# Patient Record
Sex: Male | Born: 1950 | Race: White | Hispanic: No | Marital: Married | Smoking: Former smoker
Health system: Southern US, Community
[De-identification: ages and names within clinical notes are randomized; demographics above are authoritative.]

## PROBLEM LIST (undated history)

## (undated) DIAGNOSIS — M5030 Other cervical disc degeneration, unspecified cervical region: Secondary | ICD-10-CM

## (undated) DIAGNOSIS — E8881 Metabolic syndrome: Secondary | ICD-10-CM

## (undated) DIAGNOSIS — E785 Hyperlipidemia, unspecified: Secondary | ICD-10-CM

## (undated) DIAGNOSIS — Z8619 Personal history of other infectious and parasitic diseases: Secondary | ICD-10-CM

## (undated) DIAGNOSIS — G4730 Sleep apnea, unspecified: Secondary | ICD-10-CM

## (undated) DIAGNOSIS — I10 Essential (primary) hypertension: Secondary | ICD-10-CM

## (undated) DIAGNOSIS — Z86010 Personal history of colonic polyps: Secondary | ICD-10-CM

## (undated) DIAGNOSIS — E119 Type 2 diabetes mellitus without complications: Secondary | ICD-10-CM

## (undated) DIAGNOSIS — Z85828 Personal history of other malignant neoplasm of skin: Secondary | ICD-10-CM

## (undated) DIAGNOSIS — L719 Rosacea, unspecified: Secondary | ICD-10-CM

## (undated) HISTORY — DX: Personal history of other malignant neoplasm of skin: Z85.828

## (undated) HISTORY — DX: Metabolic syndrome: E88.810

## (undated) HISTORY — DX: Metabolic syndrome: E88.81

## (undated) HISTORY — DX: Personal history of other infectious and parasitic diseases: Z86.19

## (undated) HISTORY — DX: Type 2 diabetes mellitus without complications: E11.9

## (undated) HISTORY — DX: Essential (primary) hypertension: I10

## (undated) HISTORY — DX: Hyperlipidemia, unspecified: E78.5

## (undated) HISTORY — DX: Personal history of colonic polyps: Z86.010

## (undated) HISTORY — DX: Rosacea, unspecified: L71.9

## (undated) HISTORY — PX: NASAL SINUS SURGERY: SHX719

## (undated) HISTORY — PX: CERVICAL FUSION: SHX112

## (undated) HISTORY — DX: Other cervical disc degeneration, unspecified cervical region: M50.30

---

## 1997-12-23 ENCOUNTER — Ambulatory Visit: Admission: RE | Admit: 1997-12-23 | Discharge: 1997-12-23 | Payer: Self-pay | Admitting: Otolaryngology

## 2003-04-24 HISTORY — PX: NASAL SINUS SURGERY: SHX719

## 2004-03-31 ENCOUNTER — Ambulatory Visit: Payer: Self-pay | Admitting: Internal Medicine

## 2004-04-23 HISTORY — PX: COLONOSCOPY: SHX174

## 2004-10-30 ENCOUNTER — Ambulatory Visit: Payer: Self-pay | Admitting: Internal Medicine

## 2004-11-14 ENCOUNTER — Ambulatory Visit: Payer: Self-pay | Admitting: Internal Medicine

## 2004-11-22 ENCOUNTER — Ambulatory Visit: Payer: Self-pay | Admitting: Internal Medicine

## 2004-12-06 ENCOUNTER — Ambulatory Visit: Payer: Self-pay | Admitting: Internal Medicine

## 2005-02-23 ENCOUNTER — Ambulatory Visit: Payer: Self-pay | Admitting: Internal Medicine

## 2005-04-03 ENCOUNTER — Ambulatory Visit: Payer: Self-pay | Admitting: Internal Medicine

## 2005-07-16 ENCOUNTER — Ambulatory Visit: Payer: Self-pay | Admitting: Internal Medicine

## 2006-04-11 ENCOUNTER — Ambulatory Visit: Payer: Self-pay | Admitting: Internal Medicine

## 2006-04-11 LAB — CONVERTED CEMR LAB
ALT: 38 units/L (ref 0–40)
Chol/HDL Ratio, serum: 6.7
Creatinine, Ser: 1.3 mg/dL (ref 0.4–1.5)
Hgb A1c MFr Bld: 6 % (ref 4.6–6.0)
LDL DIRECT: 159.4 mg/dL
Triglyceride fasting, serum: 175 mg/dL — ABNORMAL HIGH (ref 0–149)
VLDL: 35 mg/dL (ref 0–40)

## 2006-06-17 ENCOUNTER — Ambulatory Visit: Payer: Self-pay | Admitting: Internal Medicine

## 2007-01-30 ENCOUNTER — Ambulatory Visit: Payer: Self-pay | Admitting: Internal Medicine

## 2007-02-02 LAB — CONVERTED CEMR LAB
AST: 47 units/L — ABNORMAL HIGH (ref 0–37)
Cholesterol: 200 mg/dL (ref 0–200)
HDL: 29.9 mg/dL — ABNORMAL LOW (ref >39.0)
LDL Cholesterol: 134 mg/dL — ABNORMAL HIGH (ref 0–99)
Total CHOL/HDL Ratio: 6.7
Triglycerides: 179 mg/dL — ABNORMAL HIGH (ref 0–149)

## 2007-02-03 ENCOUNTER — Encounter (INDEPENDENT_AMBULATORY_CARE_PROVIDER_SITE_OTHER): Payer: Self-pay | Admitting: *Deleted

## 2007-02-04 ENCOUNTER — Ambulatory Visit: Payer: Self-pay | Admitting: Internal Medicine

## 2007-02-04 DIAGNOSIS — I10 Essential (primary) hypertension: Secondary | ICD-10-CM

## 2007-02-04 DIAGNOSIS — E782 Mixed hyperlipidemia: Secondary | ICD-10-CM | POA: Insufficient documentation

## 2007-02-04 LAB — CONVERTED CEMR LAB
Cholesterol, target level: 200 mg/dL
HDL goal, serum: 40 mg/dL
Hgb A1c MFr Bld: 6.1 % — ABNORMAL HIGH (ref 4.6–6.0)
LDL Goal: 130 mg/dL

## 2007-02-05 ENCOUNTER — Encounter (INDEPENDENT_AMBULATORY_CARE_PROVIDER_SITE_OTHER): Payer: Self-pay | Admitting: *Deleted

## 2007-07-22 ENCOUNTER — Encounter: Payer: Self-pay | Admitting: Internal Medicine

## 2007-07-22 ENCOUNTER — Telehealth (INDEPENDENT_AMBULATORY_CARE_PROVIDER_SITE_OTHER): Payer: Self-pay | Admitting: *Deleted

## 2007-08-06 ENCOUNTER — Ambulatory Visit: Payer: Self-pay | Admitting: Internal Medicine

## 2007-08-13 ENCOUNTER — Encounter (INDEPENDENT_AMBULATORY_CARE_PROVIDER_SITE_OTHER): Payer: Self-pay | Admitting: *Deleted

## 2007-08-13 LAB — CONVERTED CEMR LAB
Cholesterol: 260 mg/dL (ref 0–200)
Direct LDL: 154.7 mg/dL

## 2007-11-06 ENCOUNTER — Ambulatory Visit: Payer: Self-pay | Admitting: Internal Medicine

## 2008-09-07 ENCOUNTER — Encounter (INDEPENDENT_AMBULATORY_CARE_PROVIDER_SITE_OTHER): Payer: Self-pay | Admitting: *Deleted

## 2008-09-13 ENCOUNTER — Ambulatory Visit: Payer: Self-pay | Admitting: Internal Medicine

## 2008-09-13 LAB — CONVERTED CEMR LAB
Albumin: 3.7 g/dL (ref 3.5–5.2)
Alkaline Phosphatase: 38 units/L — ABNORMAL LOW (ref 39–117)
Cholesterol: 270 mg/dL — ABNORMAL HIGH (ref 0–200)
Hgb A1c MFr Bld: 5.9 % (ref 4.6–6.5)
Total CHOL/HDL Ratio: 9
Total Protein: 7.5 g/dL (ref 6.0–8.3)
Triglycerides: 492 mg/dL — ABNORMAL HIGH (ref 0.0–149.0)
VLDL: 98.4 mg/dL — ABNORMAL HIGH (ref 0.0–40.0)

## 2008-09-17 ENCOUNTER — Ambulatory Visit: Payer: Self-pay | Admitting: Internal Medicine

## 2008-09-17 DIAGNOSIS — E119 Type 2 diabetes mellitus without complications: Secondary | ICD-10-CM

## 2008-11-15 ENCOUNTER — Ambulatory Visit: Payer: Self-pay | Admitting: Internal Medicine

## 2008-11-15 DIAGNOSIS — M5412 Radiculopathy, cervical region: Secondary | ICD-10-CM | POA: Insufficient documentation

## 2008-11-18 ENCOUNTER — Encounter: Admission: RE | Admit: 2008-11-18 | Discharge: 2008-11-18 | Payer: Self-pay | Admitting: Family Medicine

## 2008-11-19 ENCOUNTER — Encounter (INDEPENDENT_AMBULATORY_CARE_PROVIDER_SITE_OTHER): Payer: Self-pay | Admitting: *Deleted

## 2008-11-24 ENCOUNTER — Telehealth (INDEPENDENT_AMBULATORY_CARE_PROVIDER_SITE_OTHER): Payer: Self-pay | Admitting: *Deleted

## 2008-12-10 ENCOUNTER — Telehealth (INDEPENDENT_AMBULATORY_CARE_PROVIDER_SITE_OTHER): Payer: Self-pay | Admitting: *Deleted

## 2008-12-10 ENCOUNTER — Encounter: Payer: Self-pay | Admitting: Internal Medicine

## 2008-12-20 ENCOUNTER — Encounter: Payer: Self-pay | Admitting: Internal Medicine

## 2009-01-07 ENCOUNTER — Ambulatory Visit (HOSPITAL_COMMUNITY): Admission: RE | Admit: 2009-01-07 | Discharge: 2009-01-07 | Payer: Self-pay | Admitting: Neurosurgery

## 2009-02-02 ENCOUNTER — Ambulatory Visit: Payer: Self-pay | Admitting: Internal Medicine

## 2009-10-27 ENCOUNTER — Ambulatory Visit: Payer: Self-pay | Admitting: Internal Medicine

## 2009-10-27 DIAGNOSIS — Z85828 Personal history of other malignant neoplasm of skin: Secondary | ICD-10-CM

## 2010-01-18 ENCOUNTER — Ambulatory Visit: Payer: Self-pay | Admitting: Internal Medicine

## 2010-05-25 NOTE — Assessment & Plan Note (Signed)
Summary: med refill/cbs   Vital Signs:  Patient profile:   60 year old male Height:      67.75 inches Weight:      235.0 pounds BMI:     36.13 Temp:     98.7 degrees F oral Pulse rate:   76 / minute Resp:     14 per minute BP sitting:   126 / 74  (left arm) Cuff size:   large  Vitals Entered By: Shonna Chock (October 27, 2009 1:46 PM)  CC: Yearly follow-up, refill Benazepril and Trilipix, Lipid Management, General Medical Evaluation Comments REVIEWED MED LIST, PATIENT AGREED DOSE AND INSTRUCTION CORRECT    CC:  Yearly follow-up, refill Benazepril and Trilipix, Lipid Management, and General Medical Evaluation.  History of Present Illness: [name] is here for med refills; he had cervical N-S in 12/2008 by Dr Gerlene Fee.  Hyperlipidemia Follow-Up      This is a 60 year old man who presents for Hyperlipidemia follow-up.  The patient denies muscle aches, GI upset, abdominal pain, flushing, itching, constipation, diarrhea, and fatigue.  The patient denies the following symptoms: chest pain/pressure, exercise intolerance, dypsnea, palpitations, syncope, and pedal edema.  Compliance with medications (by patient report) has been near 100%.  Dietary compliance has been fair.  The patient reports exercising 3-4X per week.  Adjunctive measures currently used by the patient include ASA and folic acid.    Hypertension Follow-Up      The patient also presents for Hypertension follow-up.  BP @ home not monitored.  The patient denies lightheadedness, urinary frequency, headaches, impotence, and rash.  Compliance with medications (by patient report) has been near 100%.  Adjunctive measures currently used by the patient include salt restriction.    Lipid Management History:      Positive NCEP/ATP III risk factors include male age 30 years old or older, HDL cholesterol less than 40, and hypertension.  Negative NCEP/ATP III risk factors include non-diabetic, no family history for ischemic heart disease,  non-tobacco-user status, no ASHD (atherosclerotic heart disease), no prior stroke/TIA, no peripheral vascular disease, and no history of aortic aneurysm.     Preventive Screening-Counseling & Management  Alcohol-Tobacco     Smoking Status: quit     Year Quit: 1972  Allergies (verified): No Known Drug Allergies  Past History:  Past Medical History: Hyperlipidemia: [place] Study LDL goal = < 130. Hypertension Rosacea Metabolic Syndrome; Cervical DDD Skin cancer, hx of, Dr Donzetta Starch  Past Surgical History: Sinus surgery Colonoscopy 2006 negative  Cervical fusion @ 2 levels 12/2008, Dr Gerlene Fee  Family History: Father: CABG in 48s Mother:Alsheimer's  Disease Siblings: none  Social History: Retired Former Smoker Alcohol use-no  Review of Systems  The patient denies anorexia, fever, weight loss, weight gain, vision loss, decreased hearing, hoarseness, prolonged cough, hemoptysis, abdominal pain, melena, hematochezia, severe indigestion/heartburn, hematuria, incontinence, depression, unusual weight change, abnormal bleeding, enlarged lymph nodes, and angioedema.   MS:  Denies joint pain, joint redness, joint swelling, low back pain, mid back pain, and thoracic pain. Neuro:  Denies disturbances in coordination, numbness, poor balance, tingling, and weakness.  Physical Exam  General:  well-nourished; alert,appropriate and cooperative throughout examination Head:  Normocephalic and atraumatic without obvious abnormalities. Pattern  alopecia  Eyes:  No corneal or conjunctival inflammation noted. Perrla. Funduscopic exam benign, without hemorrhages, exudates or papilledema.  Ears:  External ear exam shows no significant lesions or deformities.  Otoscopic examination reveals clear canals, tympanic membranes are intact bilaterally without bulging,  retraction, inflammation or discharge. Hearing is grossly normal bilaterally. Nose:  External nasal examination shows no deformity or  inflammation. Nasal mucosa are pink and moist without lesions or exudates. Mouth:  Oral mucosa and oropharynx without lesions or exudates.  Teeth in good repair. Neck:  No deformities, masses, or tenderness noted. Lungs:  Normal respiratory effort, chest expands symmetrically. Lungs are clear to auscultation, no crackles or wheezes. Heart:  normal rate, regular rhythm, no gallop, no rub, no JVD, no HJR, and grade 1 /6 systolic murmur.   Abdomen:  Bowel sounds positive,abdomen soft and non-tender without masses, organomegaly or hernias noted. Rectal:  No external abnormalities noted. Normal sphincter tone. No rectal masses or tenderness. Genitalia:  Testes bilaterally descended without nodularity, tenderness or masses. No scrotal masses or lesions. No penis lesions or urethral discharge. Prostate:  Prostate gland firm and smooth, no enlargement, nodularity, tenderness, mass, asymmetry or induration. Msk:  No deformity or scoliosis noted of thoracic or lumbar spine.   Pulses:  R and L carotid,radial,dorsalis pedis and posterior tibial pulses are full and equal bilaterally Extremities:  No clubbing, cyanosis, edema, or deformity noted with normal full range of motion of all joints.   Neurologic:  alert & oriented X3, strength normal in all extremities, and DTRs symmetrical and normal.   Skin:  Solar changes Cervical Nodes:  No lymphadenopathy noted Axillary Nodes:  No palpable lymphadenopathy Inguinal Nodes:  No significant adenopathy Psych:  memory intact for recent and remote, normally interactive, and good eye contact.     Impression & Recommendations:  Problem # 1:  ROUTINE GENERAL MEDICAL EXAM@HEALTH  CARE FACL (ICD-V70.0)  Orders: EKG w/ Interpretation (93000)  Problem # 2:  HYPERTENSION, ESSENTIAL NOS (ICD-401.9)  Controlled His updated medication list for this problem includes:    Benazepril Hcl 20 Mg Tabs (Benazepril hcl) .Marland Kitchen... 1 by mouth once daily.  Orders: EKG w/  Interpretation (93000)  Problem # 3:  HYPERLIPIDEMIA (ICD-272.2)  His updated medication list for this problem includes:    Trilipix 135 Mg Cpdr (Choline fenofibrate)   Problem # 4:  METABOLIC SYNDROME X (ICD-277.7)  Problem # 5:  SKIN CANCER, HX OF (ICD-V10.83) as per Dr Donzetta Starch  Complete Medication List: 1)  Benazepril Hcl 20 Mg Tabs (Benazepril hcl) .Marland Kitchen.. 1 by mouth once daily. 2)  Adult Aspirin Low Strength 81 Mg Tbdp (Aspirin) .Marland Kitchen.. 1 by mouth once daily 3)  Folic Acid 1 Mg Tabs (Folic acid) .Marland Kitchen.. 1 by mouth once daily 4)  Metoprolol Tartrate 50 Mg  .Marland KitchenMarland Kitchen. 1 by mouth two times a day 5)  Trilipix 135 Mg Cpdr (Choline fenofibrate) .Marland Kitchen.. 1 once daily  Lipid Assessment/Plan:      Based on NCEP/ATP III, the patient's risk factor category is "2 or more risk factors and a calculated 10 year CAD risk of > 20%".  The patient's lipid goals are as follows: Total cholesterol goal is 200; LDL cholesterol goal is 130; HDL cholesterol goal is 40; Triglyceride goal is 150.  His LDL cholesterol goal has not been met.  Secondary causes for hyperlipidemia have been ruled out.  He has been counseled on adjunctive measures for lowering his cholesterol and has been provided with dietary instructions.    Patient Instructions: 1)  Consume < 40 grams (< 10 tsp) of "sugar" / day from High Fructose Corn Syrup as #1,2 or #3 on label.Schedule fasting labs; see Diagnoses for Codes: 2)  BMP ; 3)  Hepatic Panel ; 4)  Lipid Panel ;  5)  TSH; 6)  CBC w/ Diff; 7)  PSA. Prescriptions: TRILIPIX 135 MG  CPDR (CHOLINE FENOFIBRATE) 1 once daily  #90 x 3   Entered and Authorized by:   Marga Melnick MD   Signed by:   Marga Melnick MD on 10/27/2009   Method used:   Print then Give to Patient   RxID:   [id number] METOPROLOL TARTRATE 50 MG 1 by mouth two times a day  #180 x 3   Entered and Authorized by:   Marga Melnick MD   Signed by:   Marga Melnick MD on 10/27/2009   Method used:   Print then Give to  Patient   RxID:   [id number] FOLIC ACID 1 MG  TABS (FOLIC ACID) 1 by mouth once daily  #90 x 3   Entered and Authorized by:   Marga Melnick MD   Signed by:   Marga Melnick MD on 10/27/2009   Method used:   Print then Give to Patient   RxID:   [id number] BENAZEPRIL HCL 20 MG TABS (BENAZEPRIL HCL) 1 by mouth once daily.  #90 x 3   Entered and Authorized by:   Marga Melnick MD   Signed by:   Marga Melnick MD on 10/27/2009   Method used:   Print then Give to Patient   RxID:   [id number]

## 2010-05-25 NOTE — Assessment & Plan Note (Signed)
Summary: FLU SHOT//PH  Nurse Visit   Allergies: No Known Drug Allergies  Orders Added: 1)  Admin 1st Vaccine [90471] 2)  Flu Vaccine 61yrs + [29562] Flu Vaccine Consent Questions     Do you have a history of severe allergic reactions to this vaccine? no    Any prior history of allergic reactions to egg and/or gelatin? no    Do you have a sensitivity to the preservative Thimersol? no    Do you have a past history of Guillan-Barre Syndrome? no    Do you currently have an acute febrile illness? no    Have you ever had a severe reaction to latex? no    Vaccine information given and explained to patient? yes    Are you currently pregnant? no    Lot Number:[id number]   Exp Date:10/21/2010   Site Given  Left Deltoid IM .lbflu

## 2010-06-12 HISTORY — PX: CERVICAL FUSION: SHX112

## 2010-07-28 LAB — BASIC METABOLIC PANEL
CO2: 26 mEq/L (ref 19–32)
Calcium: 9.7 mg/dL (ref 8.4–10.5)
Creatinine, Ser: 1.11 mg/dL (ref 0.4–1.5)
GFR calc Af Amer: >60 mL/min (ref >60)
GFR calc non Af Amer: >60 mL/min (ref >60)
Glucose, Bld: 109 mg/dL — ABNORMAL HIGH (ref 70–99)
Sodium: 138 mEq/L (ref 135–145)

## 2010-07-28 LAB — CBC
Hemoglobin: 15.7 g/dL (ref 13.0–17.0)
MCHC: 34.5 g/dL (ref 30.0–36.0)
RBC: 4.8 MIL/uL (ref 4.22–5.81)
RDW: 13.5 % (ref 11.5–15.5)

## 2010-09-14 ENCOUNTER — Ambulatory Visit (INDEPENDENT_AMBULATORY_CARE_PROVIDER_SITE_OTHER): Payer: Federal, State, Local not specified - PPO | Admitting: Family Medicine

## 2010-09-14 ENCOUNTER — Encounter: Payer: Self-pay | Admitting: Family Medicine

## 2010-09-14 VITALS — BP 120/82 | Temp 99.1°F | Wt 232.0 lb

## 2010-09-14 DIAGNOSIS — J029 Acute pharyngitis, unspecified: Secondary | ICD-10-CM

## 2010-09-14 LAB — POCT RAPID STREP A (OFFICE): Rapid Strep A Screen: NEGATIVE

## 2010-09-14 MED ORDER — AMOXICILLIN 875 MG PO TABS: 875.0000 mg | ORAL_TABLET | Freq: Two times a day (BID) | ORAL | Status: AC

## 2010-09-14 NOTE — Patient Instructions (Signed)
Common Cold, Adult An upper respiratory tract infection, or cold, is a viral infection of the air passages to the lung. Colds are contagious, especially during the first 3 or 4 days. Antibiotics cannot cure a cold. Cold germs are spread by coughs, sneezes, and hand to hand contact. A respiratory tract infection usually clears up in a few days, but some people may be sick for a week or two. HOME CARE INSTRUCTIONS  Only take over-the-counter or prescription medicines for pain, discomfort, or fever as directed by your caregiver.   Be careful not to blow your nose too hard. This may cause a nosebleed.   Use a cool-mist humidifier (vaporizer) to increase air moisture. This will make it easier for you to breath. Do not use hot steam.   Rest as much as possible and get plenty of sleep.   Wash your hands often, especially after you blow your nose. Cover your mouth and nose with a tissue when you sneeze or cough.   Drink at least 8 glasses of clear liquids every day, such as water, fruit juices, tea, clear soups, and carbonated beverages.  SEEK MEDICAL CARE IF:  An oral temperature above 100.4 lasts 4 days or more, and is not controlled by medication.   You have a sore throat that gets worse or you see white or yellow spots in your throat.   Your cough gets worse or lasts more than 10 days.   You have a rash somewhere on your skin. You have large and tender lumps in your neck.   You have an earache or a headache.   You have thick, greenish or yellowish discharge from your nose.   You cough-up thick yellow, green, gray or bloody mucus (secretions).  SEEK IMMEDIATE MEDICAL CARE IF: You have trouble breathing, chest pain, or your skin or nails look gray or blue. MAKE SURE YOU:   Understand these instructions.   Will watch your condition.   Will get help right away if you are not doing well or get worse.  Document Released: 04/06/2000 Document Re-Released: 03/22/2008 [employer] Patient  Information 2011 [employer]. 

## 2010-09-14 NOTE — Progress Notes (Signed)
Addended by: Doristine Devoid on: 09/14/2010 03:51 PM   Modules accepted: Orders

## 2010-09-14 NOTE — Progress Notes (Signed)
  Subjective:     [name] is a 60 y.o. male who presents for evaluation of symptoms of a URI. Symptoms include left ear pressure/pain, congestion, coryza, post nasal drip, sneezing and sore throat. Onset of symptoms was 3 days ago, and has been gradually worsening since that time. Treatment to date: none.  The following portions of the patient's history were reviewed and updated as appropriate: allergies, current medications, past family history, past medical history, past social history, past surgical history and problem list.  Review of Systems Pertinent items are noted in HPI.   Objective:    BP 120/82  Temp(Src) 99.1 F (37.3 C) (Oral)  Wt 232 lb (105.235 kg) General appearance: alert, cooperative, appears stated age and no distress Head: Normocephalic, without obvious abnormality, atraumatic Ears: normal TM's and external ear canals both ears Nose: Nares normal. Septum midline. Mucosa normal. No drainage or sinus tenderness. Throat: abnormal findings: marked oropharyngeal erythema and pnd Neck: moderate anterior cervical adenopathy and thyroid not enlarged, symmetric, no tenderness/mass/nodules Lungs: clear to auscultation bilaterally Heart: regular rate and rhythm, S1, S2 normal, no murmur, click, rub or gallop Lymph nodes: Cervical adenopathy: Left   Assessment:    viral pharyngitis   Plan:  veramyst and astepro Antihistamine prn Tylenol for pain and fever Amoxicillin if ST worsens Call or rto if no better in 10-14 days   Discussed diagnosis and treatment of URI. Discussed the importance of avoiding unnecessary antibiotic therapy. Suggested symptomatic OTC remedies. Nasal saline spray for congestion. Nasal steroids per orders. Follow up as needed. f/u prn

## 2010-10-05 ENCOUNTER — Telehealth: Payer: Self-pay | Admitting: Internal Medicine

## 2010-10-05 MED ORDER — METOPROLOL TARTRATE 50 MG PO TABS: 50.0000 mg | ORAL_TABLET | Freq: Two times a day (BID) | ORAL | Status: DC

## 2010-10-05 NOTE — Telephone Encounter (Signed)
Last CPX 10/2009, patient needs to schedule CPX for this year.

## 2010-10-09 NOTE — Telephone Encounter (Signed)
LMOM to call and sched CPX to see Dr Milas Gain him it would be in Sept

## 2010-10-13 ENCOUNTER — Encounter: Payer: Self-pay | Admitting: Internal Medicine

## 2010-10-16 ENCOUNTER — Ambulatory Visit (INDEPENDENT_AMBULATORY_CARE_PROVIDER_SITE_OTHER): Payer: Federal, State, Local not specified - PPO | Admitting: Internal Medicine

## 2010-10-16 ENCOUNTER — Encounter: Payer: Self-pay | Admitting: Internal Medicine

## 2010-10-16 DIAGNOSIS — R7309 Other abnormal glucose: Secondary | ICD-10-CM

## 2010-10-16 DIAGNOSIS — I10 Essential (primary) hypertension: Secondary | ICD-10-CM

## 2010-10-16 MED ORDER — METOPROLOL TARTRATE 50 MG PO TABS: 50.0000 mg | ORAL_TABLET | Freq: Two times a day (BID) | ORAL | Status: DC

## 2010-10-16 NOTE — Progress Notes (Signed)
  Subjective:    Patient ID: [name], male    DOB: 08-04-1950, 60 y.o.   MRN: [id number]  HPI  WUJ:[id number] Monitoring  Blood pressure range: not monitored  Chest pain: no   Dyspnea: no   Claudication: no Medication compliance: yes,   Medication Side Effects  Lightheadedness: no   Urinary frequency: no   Edema: no   Impotence: no  Preventitive Healthcare:  Exercise: yes, golf & walking @ work (6-7 hrs 2X/ week)   Diet Pattern: no  Salt Restriction: yes     Review of Systems     Objective:   Physical Exam Gen.: Healthy and well-nourished in appearance. Alert, appropriate and cooperative throughout exam. Neck: No deformities, masses, or tenderness noted. Thyroid  normal. Lungs: Normal respiratory effort; chest expands symmetrically. Lungs are clear to auscultation without rales, wheezes, or increased work of breathing. Heart: Normal rate and rhythm. Normal S1 and S2. No gallop, click, or rub. Grade 1/6 systolic murmur murmur. Abdomen: Bowel sounds normal; abdomen soft and nontender. No masses, organomegaly or hernias noted.  No clubbing, cyanosis, edema, or deformity noted. Nail health  good. Vascular: Carotid, radial artery, dorsalis pedis and dorsalis posterior tibial pulses are full and equal. No bruits present. Neurologic: Alert and oriented x3. Deep tendon reflexes symmetrical and normal.          Skin: Intact without suspicious lesions or rashes. Lymph: No cervical, axillary, or inguinal lymphadenopathy present. Psych: Mood and affect are normal. Normally interactive                                                                                         Assessment & Plan:  #1 HTN Plan : see Orders

## 2010-10-16 NOTE — Patient Instructions (Signed)
Preventive Health Care: Exercise at least 30-45 minutes a day,  3-4 days a week.  Eat a low-fat diet with lots of fruits and vegetables, up to 7-9 servings per day. Avoid obesity; your goal is waist measurement < 40 inches.Consume less than 40 grams of sugar per day from foods & drinks with High Fructose Corn Sugar as #2,3 or # 4 on label. Please  schedule fasting Labs : BMET,Lipids, hepatic panel,  TSH( 401.9, 790.29,272.4,995.20)

## 2010-10-18 ENCOUNTER — Other Ambulatory Visit: Payer: Self-pay | Admitting: Internal Medicine

## 2010-10-18 DIAGNOSIS — T887XXA Unspecified adverse effect of drug or medicament, initial encounter: Secondary | ICD-10-CM

## 2010-10-18 DIAGNOSIS — I10 Essential (primary) hypertension: Secondary | ICD-10-CM

## 2010-10-18 DIAGNOSIS — E785 Hyperlipidemia, unspecified: Secondary | ICD-10-CM

## 2010-10-18 DIAGNOSIS — R7309 Other abnormal glucose: Secondary | ICD-10-CM

## 2010-10-19 ENCOUNTER — Other Ambulatory Visit (INDEPENDENT_AMBULATORY_CARE_PROVIDER_SITE_OTHER): Payer: Federal, State, Local not specified - PPO

## 2010-10-19 DIAGNOSIS — E785 Hyperlipidemia, unspecified: Secondary | ICD-10-CM

## 2010-10-19 DIAGNOSIS — T887XXA Unspecified adverse effect of drug or medicament, initial encounter: Secondary | ICD-10-CM

## 2010-10-19 DIAGNOSIS — I10 Essential (primary) hypertension: Secondary | ICD-10-CM

## 2010-10-19 DIAGNOSIS — R7309 Other abnormal glucose: Secondary | ICD-10-CM

## 2010-10-19 LAB — HEPATIC FUNCTION PANEL
ALT: 50 U/L (ref 0–53)
Albumin: 4 g/dL (ref 3.5–5.2)
Total Protein: 7.5 g/dL (ref 6.0–8.3)

## 2010-10-19 LAB — CBC WITH DIFFERENTIAL/PLATELET
Basophils Relative: 0.9 % (ref 0.0–3.0)
Eosinophils Relative: 3.6 % (ref 0.0–5.0)
Lymphocytes Relative: 40.1 % (ref 12.0–46.0)
MCV: 95.5 fl (ref 78.0–100.0)
Monocytes Absolute: 0.7 10*3/uL (ref 0.1–1.0)
Monocytes Relative: 10.3 % (ref 3.0–12.0)
Neutrophils Relative %: 45.1 % (ref 43.0–77.0)
Platelets: 180 10*3/uL (ref 150.0–400.0)
RBC: 4.94 Mil/uL (ref 4.22–5.81)
WBC: 6.7 10*3/uL (ref 4.5–10.5)

## 2010-10-19 LAB — LIPID PANEL
Cholesterol: 261 mg/dL — ABNORMAL HIGH (ref 0–200)
HDL: 38 mg/dL — ABNORMAL LOW (ref >39.00)
Triglycerides: 269 mg/dL — ABNORMAL HIGH (ref 0.0–149.0)
VLDL: 53.8 mg/dL — ABNORMAL HIGH (ref 0.0–40.0)

## 2010-10-19 LAB — BASIC METABOLIC PANEL
Calcium: 9.3 mg/dL (ref 8.4–10.5)
GFR: 75.36 mL/min (ref >60.00)
Glucose, Bld: 160 mg/dL — ABNORMAL HIGH (ref 70–99)
Potassium: 4.3 mEq/L (ref 3.5–5.1)
Sodium: 136 mEq/L (ref 135–145)

## 2010-10-19 LAB — LDL CHOLESTEROL, DIRECT: Direct LDL: 158.7 mg/dL

## 2010-10-19 LAB — TSH: TSH: 1.06 u[IU]/mL (ref 0.35–5.50)

## 2010-10-19 NOTE — Progress Notes (Signed)
Labs only

## 2010-11-02 ENCOUNTER — Encounter: Payer: Self-pay | Admitting: Internal Medicine

## 2010-11-02 NOTE — Telephone Encounter (Signed)
Mailed letter to set up appt for a CPX

## 2010-11-06 ENCOUNTER — Other Ambulatory Visit: Payer: Self-pay | Admitting: Internal Medicine

## 2011-01-22 ENCOUNTER — Other Ambulatory Visit: Payer: Self-pay | Admitting: Internal Medicine

## 2011-03-27 ENCOUNTER — Other Ambulatory Visit: Payer: Self-pay | Admitting: Internal Medicine

## 2011-03-27 MED ORDER — FOLIC ACID 1 MG PO TABS: 1.0000 mg | ORAL_TABLET | Freq: Every day | ORAL | Status: DC

## 2011-03-27 NOTE — Telephone Encounter (Signed)
Rx sent 

## 2011-04-10 ENCOUNTER — Other Ambulatory Visit: Payer: Self-pay | Admitting: Orthopedic Surgery

## 2011-04-10 DIAGNOSIS — M79606 Pain in leg, unspecified: Secondary | ICD-10-CM

## 2011-04-10 DIAGNOSIS — M549 Dorsalgia, unspecified: Secondary | ICD-10-CM

## 2011-04-12 ENCOUNTER — Ambulatory Visit
Admission: RE | Admit: 2011-04-12 | Discharge: 2011-04-12 | Disposition: A | Discharge status: Home / Self Care | Payer: Federal, State, Local not specified - PPO | Source: Ambulatory Visit | Attending: Orthopedic Surgery | Admitting: Orthopedic Surgery

## 2011-04-12 DIAGNOSIS — M549 Dorsalgia, unspecified: Secondary | ICD-10-CM

## 2011-04-12 DIAGNOSIS — M79606 Pain in leg, unspecified: Secondary | ICD-10-CM

## 2011-04-12 MED ORDER — IOHEXOL 180 MG/ML  SOLN
20.0000 mL | Freq: Once | INTRAMUSCULAR | Status: AC | PRN
  Administered 2011-04-12: 20 mL via INTRATHECAL

## 2011-04-12 MED ORDER — DIAZEPAM 5 MG PO TABS
10.0000 mg | ORAL_TABLET | Freq: Once | ORAL | Status: AC
  Administered 2011-04-12: 10 mg via ORAL

## 2011-04-12 NOTE — Patient Instructions (Signed)
Myelogram   Discharge Instructions  1. Go home and rest quietly for the next 24 hours.  It is important to lie flat for the next 24 hours.  Get up only to go to the restroom.  You may lie in the bed or on a couch on your back, your stomach, your left side or your right side.  You may have one pillow under your head.  You may have pillows between your knees while you are on your side or under your knees while you are on your back.  2. DO NOT drive today.  Recline the seat as far back as it will go, while still wearing your seat belt, on the way home.  3. You may get up to go to the bathroom as needed.  You may sit up for 10 minutes to eat.  You may resume your normal diet and medications unless otherwise indicated.  4. The incidence of headache, nausea, or vomiting is about 5% (one in 20 patients).  If you develop a headache, lie flat and drink plenty of fluids until the headache goes away.  Caffeinated beverages may be helpful.  If you develop severe nausea and vomiting or a headache that does not go away with flat bed rest, call [phone].  5. You may resume normal activities after your 24 hours of bed rest is over; however, do not exert yourself strongly or do any heavy lifting tomorrow.  6. Call your physician for a follow-up appointment.  The results of your myelogram will be sent directly to your physician by the following day.  7. If you have any questions or if complications develop after you arrive home, please call [phone].  Discharge instructions have been explained to the patient.  The patient, or the person responsible for the patient, fully understands these instructions.  

## 2011-04-27 ENCOUNTER — Encounter (HOSPITAL_COMMUNITY): Payer: Self-pay

## 2011-05-01 ENCOUNTER — Other Ambulatory Visit: Payer: Self-pay | Admitting: Orthopedic Surgery

## 2011-05-02 ENCOUNTER — Encounter (HOSPITAL_COMMUNITY): Payer: Self-pay

## 2011-05-02 ENCOUNTER — Other Ambulatory Visit: Payer: Self-pay

## 2011-05-02 ENCOUNTER — Encounter (HOSPITAL_COMMUNITY)
Admission: RE | Admit: 2011-05-02 | Discharge: 2011-05-02 | Disposition: A | Discharge status: Home / Self Care | Payer: Federal, State, Local not specified - PPO | Source: Ambulatory Visit | Attending: Orthopedic Surgery | Admitting: Orthopedic Surgery

## 2011-05-02 ENCOUNTER — Ambulatory Visit (HOSPITAL_COMMUNITY)
Admission: RE | Admit: 2011-05-02 | Discharge: 2011-05-02 | Disposition: A | Discharge status: Home / Self Care | Payer: Federal, State, Local not specified - PPO | Source: Ambulatory Visit | Attending: Orthopedic Surgery | Admitting: Orthopedic Surgery

## 2011-05-02 DIAGNOSIS — Z01812 Encounter for preprocedural laboratory examination: Secondary | ICD-10-CM | POA: Insufficient documentation

## 2011-05-02 DIAGNOSIS — Z01810 Encounter for preprocedural cardiovascular examination: Secondary | ICD-10-CM | POA: Insufficient documentation

## 2011-05-02 DIAGNOSIS — I4510 Unspecified right bundle-branch block: Secondary | ICD-10-CM | POA: Insufficient documentation

## 2011-05-02 DIAGNOSIS — I10 Essential (primary) hypertension: Secondary | ICD-10-CM | POA: Insufficient documentation

## 2011-05-02 HISTORY — DX: Sleep apnea, unspecified: G47.30

## 2011-05-02 LAB — BASIC METABOLIC PANEL
BUN: 15 mg/dL (ref 6–23)
CO2: 23 mEq/L (ref 19–32)
Calcium: 9.9 mg/dL (ref 8.4–10.5)
Glucose, Bld: 278 mg/dL — ABNORMAL HIGH (ref 70–99)
Sodium: 135 mEq/L (ref 135–145)

## 2011-05-02 LAB — CBC
HCT: 44 % (ref 39.0–52.0)
Hemoglobin: 15.3 g/dL (ref 13.0–17.0)
MCH: 31.8 pg (ref 26.0–34.0)
MCHC: 34.8 g/dL (ref 30.0–36.0)
RBC: 4.81 MIL/uL (ref 4.22–5.81)

## 2011-05-02 LAB — SURGICAL PCR SCREEN: MRSA, PCR: NEGATIVE

## 2011-05-02 NOTE — Pre-Procedure Instructions (Signed)
05/02/11 Pt has noted  ? Moles around left nipple.  Instructed pt to ask wife if she has noticed and to inform PCP- Dr Marga Melnick.

## 2011-05-02 NOTE — Patient Instructions (Signed)
20 KRYSTOFER HEVENER  05/02/2011   Your procedure is scheduled on:  05/09/11 130pm-450pm  Report to [facility] at 1130 AM.  Call this number if you have problems the morning of surgery: [phone]   Remember:   Do not eat food:After Midnight.  May have clear liquids:until Midnight  May have clear liquids until 0730am then npo   Clear liquids include soda, tea, black coffee, apple or grape juice, broth.  Take these medicines the morning of surgery with A SIP OF WATER:    Do not wear jewelry,  Do not wear lotions, powders, or perfumes.     Do not bring valuables to the hospital.  Contacts, dentures or bridgework may not be worn into surgery.  Leave suitcase in the car. After surgery it may be brought to your room.  For patients admitted to the hospital, checkout time is 11:00 AM the day of discharge.      Special Instructions: CHG Shower Use Special Wash: 1/2 bottle night before surgery and 1/2 bottle morning of surgery. shower chin to toes with CHG.  Wash face and private parts with regular soap.    Please read over the following fact sheets that you were given: MRSA Information, coughing and deep breathing exercises, leg exercises

## 2011-05-02 NOTE — H&P (Signed)
NAMENOWELL, SITES NO.:  [id number]  MEDICAL RECORD NO.:  [id number]  LOCATION:  [facility]IO                        FACILITY:  [facility]  PHYSICIAN:  Marlowe Kays, M.D.  DATE OF BIRTH:  20-Aug-1950  DATE OF ADMISSION:  05/09/2011 DATE OF DISCHARGE:                             HISTORY & PHYSICAL   CHIEF COMPLAINT:  "Pain in my back and left leg."  PRESENT ILLNESS:  This 61 year old male is seen by [place] for continued progressive problems concerning pain into his low back, particularly in the upper buttocks area with radiation to the left lower extremity.  He and his wife had recently moved into a condominium here about 2 months ago.  He began having pain during that move and was eventually treated in [place] at [place].  Muscle relaxer enzymes were given, but he continued with pain and discomfort and he sought further evaluation.  He is seen by [place].  X-ray shows some stenotic changes.  Eventually, he had a myelogram with CT scan showing spinal stenosis, with HNP as well in the lumbar spine.  After much discussion, including the risks and benefits of surgery, he decided to go ahead with decompressive lumbar laminectomy and diskectomy of the lumbar spine.  The patient has noted weakness in the left lower extremity as well as pain and numbness.  His current medication is Norco.  PAST MEDICAL HISTORY:  He has no medical allergies.  His medical problems primarily include hypertension, hypercholesterolemia.  He does have some mild sleep apnea.  CURRENT MEDICATIONS: 1. Benazepril hydrochloride 1 daily. 2. Folic acid 1 mg daily. 3. Metoprolol tartrate 50 mg tablets daily. 4. Trilipix 135 mg tablets daily.  FAMILY PHYSICIAN:  Titus Dubin. Alwyn Ren, MD, FACP, FCCP  FAMILY HISTORY:  Positive for heart disease in the father and hypertension in the mother.  PAST SURGICAL HISTORY:  He has had cervical diskectomy and fusion utilizing anterior approach.  REVIEW OF SYSTEMS:   CNS:  No seizure disorder, paralysis, numbness, double vision, but he does other than in present illness with his left lower extremity.  CARDIOVASCULAR:  No chest pain.  No angina.  No orthopnea.  GASTROINTESTINAL:  No nausea, vomiting, or bloody stool. GENITOURINARY:  No discharge, dysuria, or hematuria.  MUSCULOSKELETAL: Primarily in present illness.  PHYSICAL EXAMINATION:  GENERAL:  He is alert and cooperative, friendly, 61 year old, white male, accompanied by his wife who is in some discomfort.  He has a tendency to stent the lumbar spine.  He is very careful during the examination. VITAL SIGNS:  Blood pressure 139/79, pulse is 60, respirations 12 unlabored. HEENT:  Normocephalic, PERRLA, EOM intact.  Oropharynx is clear. CHEST:  Clear to auscultation.  No rhonchi.  No rales. HEART:  Regular rate and rhythm.  No murmurs are heard. ABDOMEN:  Soft, nontender.  Liver and spleen not felt. GENITALIA/RECTAL:  Not done, not pertinent to present illness. EXTREMITIES:  The patient has negative straight leg raising bilaterally.  ADMITTING DIAGNOSES: 1. Spinal stenosis with herniated nucleus pulposus, lumbar spine. 2. Hypertension. 3. Hypercholesterolemia. 4. Mild sleep apnea (no CPAP use.)  PLAN:  The patient will undergo lumbar laminectomy and decompressive laminectomy for stenosis of the lumbar spine.  Today,  we discussed in detail the perioperative plans and all questions were encouraged and answered.     Velisa Regnier L. Cherlynn June.   ______________________________ Marlowe Kays, M.D.    DLU/MEDQ  D:  05/01/2011  T:  05/02/2011  Job:  [id number]  cc:   Titus Dubin. Alwyn Ren, MD,FACP,FCCP [phone] [address] [place], [place] [address]

## 2011-05-09 ENCOUNTER — Inpatient Hospital Stay (HOSPITAL_COMMUNITY): Payer: Federal, State, Local not specified - PPO

## 2011-05-09 ENCOUNTER — Inpatient Hospital Stay (HOSPITAL_COMMUNITY)
Admission: RE | Admit: 2011-05-09 | Discharge: 2011-05-12 | DRG: 758 | Disposition: A | Discharge status: Home Health | Payer: Federal, State, Local not specified - PPO | Source: Ambulatory Visit | Attending: Orthopedic Surgery | Admitting: Orthopedic Surgery

## 2011-05-09 ENCOUNTER — Inpatient Hospital Stay (HOSPITAL_COMMUNITY): Payer: Federal, State, Local not specified - PPO | Admitting: Anesthesiology

## 2011-05-09 ENCOUNTER — Encounter (HOSPITAL_COMMUNITY): Payer: Self-pay

## 2011-05-09 ENCOUNTER — Encounter (HOSPITAL_COMMUNITY): Payer: Self-pay | Admitting: Anesthesiology

## 2011-05-09 ENCOUNTER — Encounter (HOSPITAL_COMMUNITY)
Admission: RE | Disposition: A | Discharge status: Home Health | Payer: Self-pay | Source: Ambulatory Visit | Attending: Orthopedic Surgery

## 2011-05-09 DIAGNOSIS — I10 Essential (primary) hypertension: Secondary | ICD-10-CM

## 2011-05-09 DIAGNOSIS — G4730 Sleep apnea, unspecified: Secondary | ICD-10-CM | POA: Diagnosis present

## 2011-05-09 DIAGNOSIS — E7800 Pure hypercholesterolemia, unspecified: Secondary | ICD-10-CM | POA: Diagnosis present

## 2011-05-09 DIAGNOSIS — M5126 Other intervertebral disc displacement, lumbar region: Secondary | ICD-10-CM | POA: Diagnosis present

## 2011-05-09 DIAGNOSIS — M48061 Spinal stenosis, lumbar region without neurogenic claudication: Principal | ICD-10-CM | POA: Diagnosis present

## 2011-05-09 HISTORY — PX: LUMBAR LAMINECTOMY/DECOMPRESSION MICRODISCECTOMY: SHX5026

## 2011-05-09 LAB — GLUCOSE, CAPILLARY: Glucose-Capillary: 102 mg/dL — ABNORMAL HIGH (ref 70–99)

## 2011-05-09 SURGERY — LUMBAR LAMINECTOMY/DECOMPRESSION MICRODISCECTOMY
Anesthesia: General | Site: Back | Laterality: Left | Wound class: Clean

## 2011-05-09 MED ORDER — PROPOFOL 10 MG/ML IV EMUL
INTRAVENOUS | Status: DC | PRN
  Administered 2011-05-09: 200 mg via INTRAVENOUS

## 2011-05-09 MED ORDER — CEFAZOLIN SODIUM-DEXTROSE 2-3 GM-% IV SOLR
2.0000 g | INTRAVENOUS | Status: AC
  Administered 2011-05-09: 2 g via INTRAVENOUS

## 2011-05-09 MED ORDER — FENOFIBRATE 160 MG PO TABS
160.0000 mg | ORAL_TABLET | Freq: Every day | ORAL | Status: DC
  Filled 2011-05-09: qty 1

## 2011-05-09 MED ORDER — MIDAZOLAM HCL 5 MG/5ML IJ SOLN
INTRAMUSCULAR | Status: DC | PRN
Start: 2011-05-09 — End: 2011-05-09
  Administered 2011-05-09 (×2): 1 mg via INTRAVENOUS

## 2011-05-09 MED ORDER — ONDANSETRON HCL 4 MG/2ML IJ SOLN: 4.0000 mg | Freq: Four times a day (QID) | INTRAMUSCULAR | Status: DC | PRN

## 2011-05-09 MED ORDER — FENTANYL CITRATE 0.05 MG/ML IJ SOLN
INTRAMUSCULAR | Status: DC | PRN
  Administered 2011-05-09: 100 ug via INTRAVENOUS
  Administered 2011-05-09 (×5): 50 ug via INTRAVENOUS

## 2011-05-09 MED ORDER — DIPHENHYDRAMINE HCL 50 MG/ML IJ SOLN: 12.5000 mg | Freq: Four times a day (QID) | INTRAMUSCULAR | Status: DC | PRN

## 2011-05-09 MED ORDER — CEFAZOLIN SODIUM-DEXTROSE 2-3 GM-% IV SOLR
2.0000 g | Freq: Four times a day (QID) | INTRAVENOUS | Status: AC
  Administered 2011-05-10 (×3): 2 g via INTRAVENOUS
  Filled 2011-05-09 (×3): qty 50

## 2011-05-09 MED ORDER — LIDOCAINE HCL (CARDIAC) 20 MG/ML IV SOLN
INTRAVENOUS | Status: DC | PRN
  Administered 2011-05-09: 100 mg via INTRAVENOUS

## 2011-05-09 MED ORDER — DIPHENHYDRAMINE HCL 12.5 MG/5ML PO ELIX: 12.5000 mg | ORAL_SOLUTION | Freq: Four times a day (QID) | ORAL | Status: DC | PRN

## 2011-05-09 MED ORDER — FENOFIBRATE 54 MG PO TABS: 54.0000 mg | ORAL_TABLET | Freq: Once | ORAL | Status: DC

## 2011-05-09 MED ORDER — EPHEDRINE SULFATE 50 MG/ML IJ SOLN
INTRAMUSCULAR | Status: DC | PRN
  Administered 2011-05-09: 10 mg via INTRAVENOUS
  Administered 2011-05-09 (×4): 5 mg via INTRAVENOUS

## 2011-05-09 MED ORDER — PROMETHAZINE HCL 25 MG/ML IJ SOLN: 6.2500 mg | INTRAMUSCULAR | Status: DC | PRN

## 2011-05-09 MED ORDER — HYDROMORPHONE 0.3 MG/ML IV SOLN
INTRAVENOUS | Status: DC
  Administered 2011-05-09: 21:00:00 via INTRAVENOUS
  Administered 2011-05-10: 0.599 mg via INTRAVENOUS
  Administered 2011-05-10: 7 mg via INTRAVENOUS
  Administered 2011-05-11: 1.99 mg via INTRAVENOUS
  Administered 2011-05-11: 0.2 mg via INTRAVENOUS
  Filled 2011-05-09: qty 25

## 2011-05-09 MED ORDER — THROMBIN 5000 UNITS EX SOLR
CUTANEOUS | Status: DC | PRN
  Administered 2011-05-09: 10000 [IU] via TOPICAL

## 2011-05-09 MED ORDER — LACTATED RINGERS IV SOLN
INTRAVENOUS | Status: DC
  Administered 2011-05-09: 1000 mL via INTRAVENOUS

## 2011-05-09 MED ORDER — ONDANSETRON HCL 4 MG/2ML IJ SOLN
4.0000 mg | Freq: Four times a day (QID) | INTRAMUSCULAR | Status: DC | PRN
  Administered 2011-05-10: 4 mg via INTRAVENOUS
  Filled 2011-05-09: qty 2

## 2011-05-09 MED ORDER — LACTATED RINGERS IV SOLN
INTRAVENOUS | Status: DC | PRN
  Administered 2011-05-09 (×2): via INTRAVENOUS

## 2011-05-09 MED ORDER — NALOXONE HCL 0.4 MG/ML IJ SOLN: 0.4000 mg | INTRAMUSCULAR | Status: DC | PRN

## 2011-05-09 MED ORDER — LACTATED RINGERS IV SOLN: INTRAVENOUS | Status: DC

## 2011-05-09 MED ORDER — OXYCODONE-ACETAMINOPHEN 5-325 MG PO TABS
1.0000 | ORAL_TABLET | ORAL | Status: DC | PRN
  Administered 2011-05-09: 2 via ORAL
  Administered 2011-05-10: 1 via ORAL
  Administered 2011-05-10 – 2011-05-11 (×4): 2 via ORAL
  Administered 2011-05-11 – 2011-05-12 (×2): 1 via ORAL
  Filled 2011-05-09 (×2): qty 2
  Filled 2011-05-09 (×2): qty 1
  Filled 2011-05-09: qty 2
  Filled 2011-05-09: qty 1
  Filled 2011-05-09 (×2): qty 2

## 2011-05-09 MED ORDER — METHOCARBAMOL 500 MG PO TABS
500.0000 mg | ORAL_TABLET | Freq: Four times a day (QID) | ORAL | Status: DC | PRN
  Administered 2011-05-10 – 2011-05-12 (×4): 500 mg via ORAL
  Filled 2011-05-09 (×4): qty 1

## 2011-05-09 MED ORDER — ONDANSETRON HCL 4 MG PO TABS: 4.0000 mg | ORAL_TABLET | Freq: Four times a day (QID) | ORAL | Status: DC | PRN

## 2011-05-09 MED ORDER — ACETAMINOPHEN 10 MG/ML IV SOLN
INTRAVENOUS | Status: DC | PRN
  Administered 2011-05-09: 1000 mg via INTRAVENOUS

## 2011-05-09 MED ORDER — MEPERIDINE HCL 25 MG/ML IJ SOLN: 6.2500 mg | INTRAMUSCULAR | Status: DC | PRN

## 2011-05-09 MED ORDER — 0.9 % SODIUM CHLORIDE (POUR BTL) OPTIME
TOPICAL | Status: DC | PRN
  Administered 2011-05-09: 1000 mL

## 2011-05-09 MED ORDER — REFRESH CONTACTS DROPS SOLN: 1.0000 [drp] | Freq: Every day | Status: DC

## 2011-05-09 MED ORDER — HYDROMORPHONE HCL PF 1 MG/ML IJ SOLN: 0.2500 mg | INTRAMUSCULAR | Status: DC | PRN

## 2011-05-09 MED ORDER — METOCLOPRAMIDE HCL 10 MG PO TABS: 5.0000 mg | ORAL_TABLET | Freq: Three times a day (TID) | ORAL | Status: DC | PRN

## 2011-05-09 MED ORDER — POVIDONE-IODINE 7.5 % EX SOLN: Freq: Once | CUTANEOUS | Status: DC

## 2011-05-09 MED ORDER — METHOCARBAMOL 100 MG/ML IJ SOLN
500.0000 mg | Freq: Four times a day (QID) | INTRAVENOUS | Status: DC | PRN
  Administered 2011-05-09: 500 mg via INTRAVENOUS
  Filled 2011-05-09: qty 5

## 2011-05-09 MED ORDER — BENAZEPRIL HCL 20 MG PO TABS
20.0000 mg | ORAL_TABLET | Freq: Every day | ORAL | Status: DC
  Administered 2011-05-09 – 2011-05-12 (×4): 20 mg via ORAL
  Filled 2011-05-09 (×4): qty 1

## 2011-05-09 MED ORDER — DOCUSATE SODIUM 100 MG PO CAPS
100.0000 mg | ORAL_CAPSULE | Freq: Two times a day (BID) | ORAL | Status: DC
  Administered 2011-05-09 – 2011-05-12 (×6): 100 mg via ORAL
  Filled 2011-05-09 (×7): qty 1

## 2011-05-09 MED ORDER — SODIUM CHLORIDE 0.9 % IJ SOLN: 9.0000 mL | INTRAMUSCULAR | Status: DC | PRN

## 2011-05-09 MED ORDER — METOCLOPRAMIDE HCL 5 MG/ML IJ SOLN: 5.0000 mg | Freq: Three times a day (TID) | INTRAMUSCULAR | Status: DC | PRN

## 2011-05-09 MED ORDER — ONDANSETRON HCL 4 MG/2ML IJ SOLN
INTRAMUSCULAR | Status: DC | PRN
  Administered 2011-05-09 (×2): 2 mg via INTRAVENOUS

## 2011-05-09 MED ORDER — DEXTROSE-NACL 5-0.45 % IV SOLN
INTRAVENOUS | Status: DC
  Administered 2011-05-09 – 2011-05-11 (×3): via INTRAVENOUS

## 2011-05-09 MED ORDER — PHENYLEPHRINE HCL 10 MG/ML IJ SOLN
10.0000 mg | INTRAVENOUS | Status: DC | PRN
  Administered 2011-05-09: 20 ug/min via INTRAVENOUS

## 2011-05-09 MED ORDER — NEOSTIGMINE METHYLSULFATE 1 MG/ML IJ SOLN
INTRAMUSCULAR | Status: DC | PRN
  Administered 2011-05-09: 5 mg via INTRAVENOUS

## 2011-05-09 MED ORDER — FOLIC ACID 1 MG PO TABS
1.0000 mg | ORAL_TABLET | Freq: Every day | ORAL | Status: DC
  Administered 2011-05-09 – 2011-05-12 (×4): 1 mg via ORAL
  Filled 2011-05-09 (×5): qty 1

## 2011-05-09 MED ORDER — GLYCOPYRROLATE 0.2 MG/ML IJ SOLN
INTRAMUSCULAR | Status: DC | PRN
  Administered 2011-05-09: .8 mg via INTRAVENOUS

## 2011-05-09 MED ORDER — FENOFIBRATE 54 MG PO TABS
54.0000 mg | ORAL_TABLET | Freq: Once | ORAL | Status: AC
  Administered 2011-05-09: 54 mg via ORAL
  Filled 2011-05-09: qty 1

## 2011-05-09 MED ORDER — METOPROLOL TARTRATE 50 MG PO TABS
50.0000 mg | ORAL_TABLET | Freq: Two times a day (BID) | ORAL | Status: DC
  Administered 2011-05-09 – 2011-05-12 (×6): 50 mg via ORAL
  Filled 2011-05-09 (×7): qty 1

## 2011-05-09 MED ORDER — CISATRACURIUM BESYLATE 2 MG/ML IV SOLN
INTRAVENOUS | Status: DC | PRN
  Administered 2011-05-09: 2 mg via INTRAVENOUS
  Administered 2011-05-09 (×2): 4 mg via INTRAVENOUS
  Administered 2011-05-09: 6 mg via INTRAVENOUS

## 2011-05-09 MED ORDER — SUCCINYLCHOLINE CHLORIDE 20 MG/ML IJ SOLN
INTRAMUSCULAR | Status: DC | PRN
  Administered 2011-05-09: 120 mg via INTRAVENOUS

## 2011-05-09 SURGICAL SUPPLY — 43 items
BAG ZIPLOCK 12X15 (MISCELLANEOUS) ×2 IMPLANT
BENZOIN TINCTURE PRP APPL 2/3 (GAUZE/BANDAGES/DRESSINGS) ×2 IMPLANT
BUR EGG ELITE 4.0 (BURR) ×2 IMPLANT
CLEANER TIP ELECTROSURG 2X2 (MISCELLANEOUS) ×2 IMPLANT
CLOTH BEACON ORANGE TIMEOUT ST (SAFETY) ×2 IMPLANT
CONT SPEC 4OZ CLIKSEAL STRL BL (MISCELLANEOUS) IMPLANT
DRAIN PENROSE 18X1/4 LTX STRL (WOUND CARE) IMPLANT
DRAPE MICROSCOPE LEICA (MISCELLANEOUS) ×2 IMPLANT
DRAPE POUCH INSTRU U-SHP 10X18 (DRAPES) ×2 IMPLANT
DRAPE SURG 17X11 SM STRL (DRAPES) ×2 IMPLANT
DRSG ADAPTIC 3X8 NADH LF (GAUZE/BANDAGES/DRESSINGS) ×2 IMPLANT
DRSG EMULSION OIL 3X3 NADH (GAUZE/BANDAGES/DRESSINGS) ×2 IMPLANT
DRSG PAD ABDOMINAL 8X10 ST (GAUZE/BANDAGES/DRESSINGS) ×4 IMPLANT
DURAPREP 26ML APPLICATOR (WOUND CARE) ×2 IMPLANT
ELECT BLADE TIP CTD 4 INCH (ELECTRODE) ×2 IMPLANT
ELECT REM PT RETURN 9FT ADLT (ELECTROSURGICAL) ×2
ELECTRODE REM PT RTRN 9FT ADLT (ELECTROSURGICAL) ×1 IMPLANT
GLOVE BIO SURGEON STRL SZ8 (GLOVE) ×4 IMPLANT
GLOVE ECLIPSE 8.0 STRL XLNG CF (GLOVE) ×4 IMPLANT
GLOVE INDICATOR 8.0 STRL GRN (GLOVE) ×2 IMPLANT
GOWN STRL REIN XL XLG (GOWN DISPOSABLE) ×2 IMPLANT
KIT BASIN OR (CUSTOM PROCEDURE TRAY) ×2 IMPLANT
KIT POSITIONING SURG ANDREWS (MISCELLANEOUS) ×2 IMPLANT
MANIFOLD NEPTUNE II (INSTRUMENTS) ×2 IMPLANT
NEEDLE SPNL 18GX3.5 QUINCKE PK (NEEDLE) ×6 IMPLANT
NS IRRIG 1000ML POUR BTL (IV SOLUTION) ×2 IMPLANT
PATTIES SURGICAL .5 X.5 (GAUZE/BANDAGES/DRESSINGS) ×2 IMPLANT
PATTIES SURGICAL .75X.75 (GAUZE/BANDAGES/DRESSINGS) ×2 IMPLANT
PATTIES SURGICAL 1X1 (DISPOSABLE) IMPLANT
POSITIONER SURGICAL ARM (MISCELLANEOUS) ×2 IMPLANT
SPONGE GAUZE 4X4 12PLY (GAUZE/BANDAGES/DRESSINGS) ×2 IMPLANT
SPONGE LAP 4X18 X RAY DECT (DISPOSABLE) ×8 IMPLANT
SPONGE SURGIFOAM ABS GEL 100 (HEMOSTASIS) ×2 IMPLANT
STAPLER VISISTAT 35W (STAPLE) ×2 IMPLANT
SUT VIC AB 1 CT1 27 (SUTURE) ×2
SUT VIC AB 1 CT1 27XBRD ANTBC (SUTURE) ×2 IMPLANT
SUT VIC AB 2-0 CT1 27 (SUTURE) ×2
SUT VIC AB 2-0 CT1 27XBRD (SUTURE) ×2 IMPLANT
TAPE HYPAFIX 6X30 (GAUZE/BANDAGES/DRESSINGS) ×2 IMPLANT
TOWEL OR 17X26 10 PK STRL BLUE (TOWEL DISPOSABLE) ×4 IMPLANT
TRAY LAMINECTOMY (CUSTOM PROCEDURE TRAY) ×2 IMPLANT
WATER STERILE IRR 1500ML POUR (IV SOLUTION) ×2 IMPLANT
YANKAUER SUCT BULB TIP NO VENT (SUCTIONS) ×2 IMPLANT

## 2011-05-09 NOTE — Anesthesia Postprocedure Evaluation (Signed)
  Anesthesia Post-op Note  Patient: [name]  Procedure(s) Performed:  LUMBAR LAMINECTOMY/DECOMPRESSION MICRODISCECTOMY - Decompressive laminectomy L2-3,  L3- 4,  L4-5 left, central   Patient Location: PACU  Anesthesia Type: General  Level of Consciousness: awake and alert   Airway and Oxygen Therapy: Patient Spontanous Breathing  Post-op Pain: mild  Post-op Assessment: Post-op Vital signs reviewed, Patient's Cardiovascular Status Stable, Respiratory Function Stable, Patent Airway and No signs of Nausea or vomiting  Post-op Vital Signs: stable  Complications: No apparent anesthesia complications

## 2011-05-09 NOTE — Interval H&P Note (Signed)
History and Physical Interval Note:  05/09/2011 12:53 PM  [name]  has presented today for surgery, with the diagnosis of spinal stenosis  The various methods of treatment have been discussed with the patient and family. After consideration of risks, benefits and other options for treatment, the patient has consented to  Procedure(s): LUMBAR LAMINECTOMY/DECOMPRESSION MICRODISCECTOMY as a surgical intervention .  The patients' history has been reviewed, patient examined, no change in status, stable for surgery.  I have reviewed the patients' chart and labs.  Questions were answered to the patient's satisfaction.     Zorah Backes P

## 2011-05-09 NOTE — Transfer of Care (Signed)
Immediate Anesthesia Transfer of Care Note  Patient: [name]  Procedure(s) Performed:  LUMBAR LAMINECTOMY/DECOMPRESSION MICRODISCECTOMY - Decompressive laminectomy L2-3,  L3- 4,  L4-5 left, central   Patient Location: PACU  Anesthesia Type: General  Level of Consciousness: sedated, patient cooperative and responds to stimulaton  Airway & Oxygen Therapy: Patient Spontanous Breathing and Patient connected to face mask oxgen  Post-op Assessment: Report given to PACU RN and Post -op Vital signs reviewed and stable  Post vital signs: Reviewed and stable  Complications: No apparent anesthesia complications

## 2011-05-09 NOTE — Brief Op Note (Signed)
05/09/2011  5:12 PM  PATIENT:  [name]  61 y.o. 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, L3-4,L4-5; possible HNP L3-4 Lt POST-OPERATIVE DIAGNOSIS:  Spinal stenosis L2-3,L3-4,L4-5  PROCEDURE:  Procedure(s): LUMBAR LAMINECTOMY/DECOMPRESSIONL2-3,L3-4,L4-5  SURGEON:  Surgeon(s): Drucilla Schmidt, MD Jacki Cones, MD  PHYSICIAN ASSISTANT:   ASSISTANTS: nurse   ANESTHESIA:   general  EBL:  Total I/O In: 2800 [I.V.:2800] Out: 925 [Urine:475; Blood:450]  BLOOD ADMINISTERED:none  DRAINS: none   LOCAL MEDICATIONS USED:  NONE  SPECIMEN:  No Specimen  DISPOSITION OF SPECIMEN:  N/A  COUNTS:  YES  TOURNIQUET:  * No tourniquets in log *  DICTATION: .Other Dictation: Dictation Number [id number]  PLAN OF CARE: Admit to inpatient   PATIENT DISPOSITION:  PACU - hemodynamically stable.

## 2011-05-09 NOTE — Anesthesia Preprocedure Evaluation (Addendum)
Anesthesia Evaluation  Patient identified by MRN, date of birth, ID band Patient awake    Reviewed: Allergy & Precautions, H&P , NPO status , Patient's Chart, lab work & pertinent test results, reviewed documented beta blocker date and time   Airway Mallampati: II TM Distance: >3 FB Neck ROM: Full    Dental No notable dental hx.    Pulmonary neg pulmonary ROS,  clear to auscultation  Pulmonary exam normal       Cardiovascular hypertension, Pt. on medications neg cardio ROS Regular Normal    Neuro/Psych  Neuromuscular disease Negative Neurological ROS  Negative Psych ROS   GI/Hepatic negative GI ROS, Neg liver ROS,   Endo/Other  Negative Endocrine ROS  Renal/GU negative Renal ROS  Genitourinary negative   Musculoskeletal negative musculoskeletal ROS (+)   Abdominal   Peds negative pediatric ROS (+)  Hematology negative hematology ROS (+)   Anesthesia Other Findings   Reproductive/Obstetrics negative OB ROS                          Anesthesia Physical Anesthesia Plan  ASA: II  Anesthesia Plan: General   Post-op Pain Management:    Induction: Intravenous  Airway Management Planned:   Additional Equipment:   Intra-op Plan:   Post-operative Plan: Extubation in OR  Informed Consent: I have reviewed the patients History and Physical, chart, labs and discussed the procedure including the risks, benefits and alternatives for the proposed anesthesia with the patient or authorized representative who has indicated his/her understanding and acceptance.   Dental advisory given  Plan Discussed with: CRNA  Anesthesia Plan Comments:         Anesthesia Quick Evaluation

## 2011-05-10 NOTE — Op Note (Signed)
[name], [name]              ACCOUNT NO.:  [id number]  MEDICAL RECORD NO.:  [id number]  LOCATION:  1526                         FACILITY:  [facility]  PHYSICIAN:  Marlowe Kays, M.D.  DATE OF BIRTH:  Dec 17, 1950  DATE OF PROCEDURE:  05/09/2011 DATE OF DISCHARGE:                              OPERATIVE REPORT   PREOPERATIVE DIAGNOSES: 1. Spinal stenosis L2-3, L3-4, and L4-5. 2. Suspected herniated nucleus pulposus, L3-4, left.  POSTOPERATIVE DIAGNOSIS:  Spinal stenosis L2-3, L3-4, L4-5.  OPERATION:  Decompressive laminectomy, L2-3, L3-4, and L4-5.  SURGEON:  Marlowe Kays, MD  ASSISTANT:  Georges Lynch. Gioffre, MD  ANESTHESIA:  General.  PATHOLOGY AND JUSTIFICATION FOR PROCEDURE:  He is having mainly left leg pain and numbness over the lateral thigh.  On MRI, he had a good bit of spondylosis on plain x-rays and lateral x-ray of the lumbar spine demonstrated some posterior subluxation of L2 on L3.  A lumbar myelogram CT scan demonstrated on the myelogram lateral significant compression at L2-3, L3-4, and L4-5.  On the AP projection, he had a large lateral defect, particularly at L3-4 and L4-5 on the left.  CT scan demonstrated what was felt to be a disk herniation at L3-4 on the left.  PROCEDURE IN DETAIL:  Satisfied general anesthesia, prone position on the [place] frame.  Prophylactic antibiotics for monitoring during the case.  A Foley catheter was inserted with a plan to remove it at the end of the case.  Back was prepped with DuraPrep and draped in sterile field.  Collier Flowers was employed.  Time-out was performed.  Midline incision and my initial x-ray taken with Kocher clamps, identifying the spinous processes showed the clamps to be in L3 and L5.  Based on this, I extended the incision somewhat proximalward to expose the L2 spinous process and neural arch as well.  Self-retaining McCullough retractors were inserted, soft tissue was dissected off the neural arches from L2- L5  and once we achieved hemostasis, I began removing bone with double- action rongeur.  The spinous processes and neural arches being dissected out.  We then brought in the microscope and completed the fine points of dissection, primarily with 2 and 3 mm Kerrison rongeurs.  The large defect, particularly at L3-4, but also slightly L4-5 on the left side proved to be significant bone compression.  Particularly, at L4-5 on the left the nerve at this location was very irritable and was thoroughly decompressed.  We took a second x-ray documenting the level of decompression in the level of the L3-4 disk, which was right at the significant bone indentation on the dura.  After decompressing the dura and performing wide foraminotomies at every level and particularly at L3- 4 and L4-5 on the left there was no longer any nerve compression.  The dura was seen to be quite fragile and fairly immobile and we hesitated trying to do very much in the way of manipulation of the dura for this reason.  Also, we both felt that the abnormality seen on myelogram was bone and that the nerve roots had been thoroughly decompressed.  We both felt that the operation had been successfully concluded.  The wound was irrigated with  sterile saline.  Gelfoam soaked in thrombin was placed over the dura.  The wound was then closed in layers with minimal bleeding present with interrupted #1 Vicryl, the paralumbar muscle and fascia in layers, leaving a 2 cm aperture distally for egress of fluids. The subcutaneous tissue superficially was closed with 2-0 Vicryl, staples on the skin.  Betadine, Adaptic, dry sterile dressing were applied.  As noted, the Foley was removed.  He was gently placed in his PACU bed and taken there in satisfactory condition with no known complications.  ESTIMATED BLOOD LOSS:  450 cc.  No blood replacement.          ______________________________ Marlowe Kays, M.D.     JA/MEDQ  D:  05/09/2011   T:  05/10/2011  Job:  [id number]

## 2011-05-10 NOTE — Progress Notes (Signed)
Patient ID: [name], male   DOB: Sep 16, 1950, 61 y.o.   MRN: [id number] Good night.  Has been able to void.  Pre-op leg sx resolving.  Up today.

## 2011-05-10 NOTE — Progress Notes (Signed)
Physical Therapy Evaluation Patient Details Name: [name] MRN: [id number] DOB: 04/05/1951 Today's Date: 05/10/2011 10:30-10:56, Ev2  Problem List:  Patient Active Problem List  Diagnoses  . HYPERLIPIDEMIA  . METABOLIC SYNDROME X  . HYPERTENSION, ESSENTIAL NOS  . CERVICAL RADICULOPATHY, RIGHT  . SKIN CANCER, HX OF  . Other abnormal glucose    Past Medical History:  Past Medical History  Diagnosis Date  . Hyperlipidemia     [place] Study LDL goal = <130  . Hypertension   . Rosacea   . Metabolic syndrome   . DDD (degenerative disc disease), cervical   . History of skin cancer     Dr.Drew Yetta Richard  . Sleep apnea     does not use CPAP machine    Past Surgical History:  Past Surgical History  Procedure Date  . Nasal sinus surgery   . Cervical fusion   . Colonoscopy 2006    Neg    PT Assessment/Plan/Recommendation PT Assessment Clinical Impression Statement: 61 y/o WM admitted for lumbar laminectomy/decompresion and microdiscectomy.  Pt will be followed acutely.  Depending on progress in PTpt may need HHPT upon discharge. PT Recommendation/Assessment: Patient will need skilled PT in the acute care venue PT Problem List: Decreased knowledge of use of DME;Decreased knowledge of precautions;Decreased mobility;Pain PT Therapy Diagnosis : Difficulty walking PT Plan PT Frequency: Min 5X/week PT Treatment/Interventions: Functional mobility training;Therapeutic activities;Gait training;DME instruction PT Recommendation Follow Up Recommendations: Home health PT (depending on progress) Equipment Recommended: Rolling walker with 5" wheels;3 in 1 bedside comode PT Goals  Acute Rehab PT Goals PT Goal Formulation: With patient/family Time For Goal Achievement: 7 days Pt will go Supine/Side to Sit: with modified independence;with HOB 0 degrees PT Goal: Supine/Side to Sit - Progress: Not met Pt will go Sit to Supine/Side: with modified independence PT Goal: Sit to  Supine/Side - Progress: Not met Pt will go Sit to Stand: with modified independence PT Goal: Sit to Stand - Progress: Not met Pt will Ambulate: >150 feet;with least restrictive assistive device;with modified independence PT Goal: Ambulate - Progress: Not met  PT Evaluation Precautions/Restrictions  Precautions Precautions: Back Precaution Booklet Issued: Yes (comment) Prior Functioning  Home Living Lives With: Spouse Receives Help From: Family Type of Home: House Home Layout: One level Home Access: Level entry Bathroom Shower/Tub: Health visitor: Standard Home Adaptive Equipment: None Prior Function Level of Independence: Independent with homemaking with ambulation;Independent with basic ADLs Driving: Yes Vocation: Retired Producer, television/film/video: Awake/alert Overall Cognitive Status: Appears within functional limits for tasks assessed Orientation Level: Oriented X4 Sensation/Coordination Sensation Additional Comments: pt reports some numbness in B feet. Extremity Assessment RLE Assessment RLE Assessment: Within Functional Limits LLE Assessment LLE Assessment: Within Functional Limits Mobility (including Balance) Bed Mobility Bed Mobility: Yes Rolling Right: 4: Min assist Rolling Right Details (indicate cue type and reason): min/guard Right Sidelying to Sit: 4: Min assist Transfers Transfers: Yes Sit to Stand: 4: Min assist;From elevated surface Sit to Stand Details (indicate cue type and reason): cues for hand placement Stand to Sit: 4: Min assist Stand to Sit Details: min/guard Ambulation/Gait Ambulation/Gait: Yes Ambulation/Gait Assistance: 4: Min assist;3: Mod assist Ambulation/Gait Assistance Details (indicate cue type and reason): Initially pt required MOD A secondary to buckeling of B LE, decreased to MIN A Ambulation Distance (Feet): 15 Feet Assistive device: Rolling walker Gait Pattern: Left flexed knee in stance;Right  flexed knee in stance      End of Session PT - End of Session Activity  Tolerance: Patient tolerated treatment well;Patient limited by pain Patient left: in chair;with call bell in reach;with family/visitor present Nurse Communication: Mobility status for transfers;Mobility status for ambulation (pain level and buckeling of legs) General Behavior During Session: [facility] for tasks performed Cognition: [facility] for tasks performed  [facility] [name] 05/10/2011, 12:19 PM

## 2011-05-11 ENCOUNTER — Encounter (HOSPITAL_COMMUNITY): Payer: Self-pay | Admitting: Orthopedic Surgery

## 2011-05-11 NOTE — Progress Notes (Signed)
PO day #2.  Lt leg only has "soreness" and left foot is stronger.  Dressing changed--wound clean and dry.PT and he both feel he needs another day here.  Will d/c iv and place on po analgesic.

## 2011-05-11 NOTE — Progress Notes (Signed)
CARE MANAGEMENT NOTE 05/11/2011  Patient:  SAAHAS, HIDROGO   Account Number:  [id number]  Date Initiated:  05/11/2011  Documentation initiated by:  Alante Weimann  Subjective/Objective Assessment:   61 yo male admitted 05/09/11 with spinal stewnosis S/P Decompressive Laminectomy     Action/Plan:   D/C when medcially stable.   Anticipated DC Date:  05/14/2011   Anticipated DC Plan:  HOME/SELF CARE      DC Planning Services  CM consult      PAC Choice  DURABLE MEDICAL EQUIPMENT   Choice offered to / List presented to:  C-1 Patient   DME arranged  3-N-1  Levan Hurst      DME agency  [facility]        Status of service:  Completed, signed off  Discharge Disposition:  HOME/SELF CARE    Comments:  05/11/11, Kathi Der RNC-MNN, BSN, [phone], CM received referral for DME.  DME ordered as above.  Pt active with [facility].  Debbie contacted at [facility] with DME orders-confirmation of DME orders received.  Pt has no HH services ordered at this time.

## 2011-05-11 NOTE — Progress Notes (Signed)
Physical Therapy Treatment Patient Details Name: [name] MRN: [id number] DOB: [birth year] Today's Date: 05/11/2011  9:00 - 9:25 1 ta  1 gt  PT Assessment/Plan  PT - Assessment/Plan Comments on Treatment Session: Pt plans to D/C to home tommorrow stated Dr Leslee Home. He wrote arder for RW and 3:1 PT Plan: Discharge plan remains appropriate Follow Up Recommendations: No PT follow up (Dr [place] feels pt does not need HH services) PT Goals  Acute Rehab PT Goals PT Goal Formulation: With patient Pt will go Supine/Side to Sit: with modified independence PT Goal: Supine/Side to Sit - Progress: Progressing toward goal Pt will go Sit to Supine/Side: with modified independence PT Goal: Sit to Supine/Side - Progress: Progressing toward goal Pt will go Sit to Stand: with modified independence PT Goal: Sit to Stand - Progress: Progressing toward goal Pt will Ambulate: >150 feet;with least restrictive assistive device;with modified independence PT Goal: Ambulate - Progress: Progressing toward goal  PT Treatment Precautions/Restrictions  Precautions Precautions: Back Precaution Booklet Issued: Yes (comment) Precaution Comments: pt recalls 2/3 back percautions.  Re educated and demonstarted Required Braces or Orthoses: No Restrictions Weight Bearing Restrictions: No Mobility (including Balance) Bed Mobility Bed Mobility: Yes Rolling Right: 4: Min assist Rolling Right Details (indicate cue type and reason): demonstarted and educated on proper Log Roll Technique Right Sidelying to Sit: 4: Min assist Right Sidelying to Sit Details (indicate cue type and reason): increased time and 25% VC's on ptoper technique Transfers Sit to Stand:  (MinGuard Assist) Sit to Stand Details (indicate cue type and reason): 25% VC's on hand placement and to avoid excessive forward lean Stand to Sit:  (MinGuard Assist) Stand to Sit Details: 25% VC's on hand placement   Ambulation/Gait Ambulation/Gait: Yes Ambulation/Gait Assistance:  (MinGuard Assist) Ambulation/Gait Assistance Details (indicate cue type and reason): extra time and 25% VC's on safety with turns and backward gait Ambulation Distance (Feet): 30 Feet Assistive device: Rolling walker Gait Pattern: Step-to pattern Gait velocity: 25% VC's on proper walker to self distance and safety with truns.  Pt amb from the bed to the bathroom then to the recliner.  Too fatigued to attempt amb in hallway after this activity. Stairs: No Corporate treasurer: No    Exercise    End of Session PT - End of Session Equipment Utilized During Treatment: Gait belt Activity Tolerance: Patient limited by fatigue;Patient limited by pain Patient left: in chair;with call bell in reach General Behavior During Session: [facility] for tasks performed Cognition: [facility] for tasks performed  Felecia Shelling  PTA [facility]  Acute  Rehab Pager     [phone]

## 2011-05-12 MED ORDER — HYDROCODONE-ACETAMINOPHEN 5-325 MG PO TABS: 1.0000 | ORAL_TABLET | Freq: Four times a day (QID) | ORAL | Status: DC | PRN

## 2011-05-12 MED ORDER — METHOCARBAMOL 500 MG PO TABS: 500.0000 mg | ORAL_TABLET | Freq: Four times a day (QID) | ORAL | Status: AC | PRN

## 2011-05-12 NOTE — Progress Notes (Signed)
Subjective: 3 Days Post-Op Procedure(s) (LRB): LUMBAR LAMINECTOMY/DECOMPRESSION MICRODISCECTOMY (Left) Patient reports pain as 4 on 0-10 scale.   Denies CP or SOB.  Voiding without difficulty. Positive flatus. Objective: Vital signs in last 24 hours: Temp:  [98.2 F (36.8 C)-99.5 F (37.5 C)] 98.3 F (36.8 C) (01/19 0620) Pulse Rate:  [82-91] 82  (01/19 0620) Resp:  [16-18] 18  (01/19 0620) BP: (117-132)/(75-81) 132/81 mmHg (01/19 0620) SpO2:  [94 %-98 %] 96 % (01/19 0620)  Intake/Output from previous day: 01/18 0701 - 01/19 0700 In: 1765 [P.O.:1440; I.V.:325] Out: 1525 [Urine:1525] Intake/Output this shift:    No results found for this basename: HGB:5 in the last 72 hours No results found for this basename: WBC:2,RBC:2,HCT:2,PLT:2 in the last 72 hours No results found for this basename: NA:2,K:2,CL:2,CO2:2,BUN:2,CREATININE:2,GLUCOSE:2,CALCIUM:2 in the last 72 hours No results found for this basename: LABPT:2,INR:2 in the last 72 hours  Neurologically intact Neurovascular intact Sensation intact distally Dorsiflexion/Plantar flexion intact Incision: no drainage Compartment soft  Assessment/Plan: 3 Days Post-Op Procedure(s) (LRB): LUMBAR LAMINECTOMY/DECOMPRESSION MICRODISCECTOMY (Left) Discharge home with home health after PT/OT  Lashawnta Burgert R. 05/12/2011, 8:38 AM

## 2011-05-12 NOTE — Progress Notes (Signed)
CM spoke with patient concerning d/c planning. No MD order for [facility] services. Pt states no HH services needed. MD order for RW,3N1, per PT recommendation. DME in pt room prior to discharge. Pt's wife to provide tx home.  [name] [phone]

## 2011-05-12 NOTE — Progress Notes (Signed)
Physical Therapy Treatment Patient Details Name: EBONY RICKEL MRN: [id number] DOB: [birth year] Today's Date: 05/12/2011  PT Assessment/Plan  PT - Assessment/Plan Comments on Treatment Session: pt ready for D/C to home.  Reviewed car transfer technique with patient and home exercise/activity.  Pt would benefit for eventual outpatient PT for core strengthening PT Plan: Discharge plan remains appropriate PT Goals  Acute Rehab PT Goals PT Goal: Supine/Side to Sit - Progress: Partly met PT Goal: Sit to Supine/Side - Progress: Partly met PT Goal: Sit to Stand - Progress: Met PT Goal: Ambulate - Progress: Partly met  PT Treatment Precautions/Restrictions  Precautions Precautions: Back Precaution Booklet Issued: Yes (comment) Precaution Comments: pt able to recall 3/3 precautions. "BAT" no bending, arching or twisting Required Braces or Orthoses: No Restrictions Weight Bearing Restrictions: No Mobility (including Balance) Bed Mobility Bed Mobility: Yes Rolling Right: 5: Supervision Right Sidelying to Sit: 5: Supervision;HOB flat Transfers Transfers: Yes Sit to Stand: 4: Min assist Sit to Stand Details (indicate cue type and reason): cues to push up with arms and maintain trunk extension Stand to Sit: 6: Modified independent (Device/Increase time) Ambulation/Gait Ambulation/Gait: Yes Ambulation/Gait Assistance: 6: Modified independent (Device/Increase time) Ambulation/Gait Assistance Details (indicate cue type and reason): pt encouraged to activate core while ambulating Noticed "sciatic scoliosis" with pt wanting to shift spine away from left side.  Able to correct some with active trunk extension Ambulation Distance (Feet): 75 Feet Assistive device: Rolling walker Gait Pattern: Step-through pattern Gait velocity: slow Stairs: No (pt has level entry at home) Wheelchair Mobility Wheelchair Mobility: No  Posture/Postural Control Posture/Postural Control: Postural  limitations Postural Limitations: "sciatic scoliosis" with shift away from left side Balance Balance Assessed: No Exercise  Other Exercises Other Exercises: abdominal sets, glute sets active trunk extension in standing for neutral spine position End of Session PT - End of Session Equipment Utilized During Treatment: Other (comment) (RW, adjusted the RW and 3in1 that he will be taking home) Activity Tolerance: Patient tolerated treatment well Patient left: in chair;with call bell in reach Nurse Communication: Mobility status for transfers;Mobility status for ambulation General Behavior During Session: [facility] for tasks performed Cognition: [facility] for tasks performed  Donnetta Hail 05/12/2011, 10:19 AM

## 2011-05-13 NOTE — Discharge Summary (Signed)
[name], Johnny              ACCOUNT NO.:  [id number]  MEDICAL RECORD NO.:  [id number]  LOCATION:  1526                         FACILITY:  [facility]  PHYSICIAN:  Marlowe Kays, M.D.  DATE OF BIRTH:  09-27-1950  DATE OF ADMISSION:  05/09/2011 DATE OF DISCHARGE:  05/12/2011                              DISCHARGE SUMMARY   ADMISSION DIAGNOSIS:  Spinal stenosis L2-3, L3-4, and L4-5 with a possible disk herniation, L3-4, left.  DISCHARGE DIAGNOSIS:  Spinal stenosis L2-3, L3-4, and L4-5.  OPERATION:  On May 09, 2011, decompressive laminectomy, L2-3, L3-4, and L4-5.  SUMMARY:  This patient was admitted for surgical treatment for severe left low back and primarily left leg pain and numbness with a little weakness in his left foot.  A myelogram CT scan has demonstrated extradural defects at all 3 levels with a large defect at L3-4 on the left.  The CT scan demonstrated what appeared to be a disk herniation at L3-4.  Admission studies were unremarkable.  At surgery, we found a large prominent bony indentation at L3-4 in particular.  The L4 and L5 nerve roots were very sensitive, particularly L5 nerve root.  These were well decompressed.  The dura was thin and was somewhat tethered down and we did not aggressively search for the L3-4 disk, because we felt that the extradural defect on the myelogram was due to the large bony projection on the 2 nerve roots in question.  They were very sensitive and irritable, had been well decompressed.  Postoperative course was uneventful.  He was given physical therapy, and was ambulating satisfactorily, was felt ready for discharge.  His wound was redressed prior to discharge and was healing nicely.  He was afebrile at discharge.  Prescriptions for Percocet and Robaxin were given as well as, at request of Physical Therapy, a rolling walker with 5 inch wheels and a commode elevator.  Plan was to have him return to my office 2 weeks from surgery  for removal of staples.  CONDITION AT DISCHARGE:  Stable and improved.          ______________________________ Marlowe Kays, M.D.     JA/MEDQ  D:  05/13/2011  T:  05/13/2011  Job:  [id number]

## 2011-09-04 ENCOUNTER — Other Ambulatory Visit: Payer: Self-pay | Admitting: Internal Medicine

## 2011-10-08 ENCOUNTER — Other Ambulatory Visit: Payer: Self-pay | Admitting: Internal Medicine

## 2011-10-08 NOTE — Telephone Encounter (Signed)
OK # [phone] 

## 2011-10-08 NOTE — Telephone Encounter (Signed)
Last Ov 10-16-10, never filled, ok to fill

## 2011-11-30 ENCOUNTER — Other Ambulatory Visit: Payer: Self-pay | Admitting: Internal Medicine

## 2011-12-03 NOTE — Telephone Encounter (Signed)
Patient needs to schedule a CPX  

## 2012-02-27 ENCOUNTER — Other Ambulatory Visit: Payer: Self-pay | Admitting: Internal Medicine

## 2012-02-28 NOTE — Telephone Encounter (Signed)
Pt scheduled for next Thursday, 03/06/12.

## 2012-02-28 NOTE — Telephone Encounter (Signed)
Rx sent.    MW 

## 2012-02-28 NOTE — Telephone Encounter (Signed)
OV 10/16/10. Lopressor last filled 10/16/10 #180 x 3 Folic acid last filled 10/08/11 #90 x 0   Plz advise    MW

## 2012-02-28 NOTE — Telephone Encounter (Signed)
1 month Rx of both ; last seen 6/12. Needs appt

## 2012-03-06 ENCOUNTER — Ambulatory Visit (INDEPENDENT_AMBULATORY_CARE_PROVIDER_SITE_OTHER): Payer: Federal, State, Local not specified - PPO | Admitting: Internal Medicine

## 2012-03-06 ENCOUNTER — Encounter: Payer: Self-pay | Admitting: Internal Medicine

## 2012-03-06 VITALS — BP 128/86 | HR 84 | Temp 98.3°F | Wt 222.0 lb

## 2012-03-06 DIAGNOSIS — I10 Essential (primary) hypertension: Secondary | ICD-10-CM

## 2012-03-06 DIAGNOSIS — R7309 Other abnormal glucose: Secondary | ICD-10-CM

## 2012-03-06 DIAGNOSIS — E782 Mixed hyperlipidemia: Secondary | ICD-10-CM

## 2012-03-06 DIAGNOSIS — Z23 Encounter for immunization: Secondary | ICD-10-CM

## 2012-03-06 MED ORDER — METOPROLOL TARTRATE 50 MG PO TABS: 50.0000 mg | ORAL_TABLET | Freq: Two times a day (BID) | ORAL | Status: DC

## 2012-03-06 MED ORDER — FOLIC ACID 1 MG PO TABS: 1.0000 mg | ORAL_TABLET | Freq: Every day | ORAL | Status: DC

## 2012-03-06 NOTE — Assessment & Plan Note (Signed)
Nutritional interventions to prevent tragal slight elevation discussed. Fasting lipids should be performed in 10-12 weeks after nutrition we'll change. His exercise program is good

## 2012-03-06 NOTE — Patient Instructions (Addendum)
The most common cause of elevated triglycerides is the ingestion of sugar from high fructose corn syrup sources added to processed foods & drinks.  Eat a low-fat diet with lots of fruits and vegetables, up to 7-9 servings per day. Consume less than 40 (preferably ZERO) grams of sugar per day from foods & drinks with High Fructose Corn Syrup (HFCS) sugar as #1,2,3 or # 4 on label.Whole Foods, [employer] & [employer] do not carry products with HFCS. Follow a  low carb nutrition program such as [place] or The New Sugar [employer]  to prevent Diabetes progression . White carbohydrates (potatoes, rice, bread, and pasta) have a high spike of sugar and a high load of sugar. For example a  baked potato has a cup of sugar and a  french fry  2 teaspoons of sugar. Yams, wild  rice, whole grained bread &  wheat pasta have been much lower spike and load of  sugar. Portions should be the size of a deck of cards or your palm. Blood Pressure [place] average < 140/90,  Ideally  < 135/85. This AVERAGE should be calculated from @ least 5-7 BP readings taken @ different times of day on different days of week. You should not respond to isolated BP readings , but rather the AVERAGE for that week   Goals for home glucose monitoring are : fasting  or morning glucose goal of  100-150. Two hours after any meal , goal = < 180, preferably < 160. Report any low blood glucoses immediately.

## 2012-03-06 NOTE — Assessment & Plan Note (Signed)
Blood pressure control appears to be good; goals discussed. Renal function will be checked

## 2012-03-06 NOTE — Assessment & Plan Note (Addendum)
The last fasting glucose of 278 suggested his A1c will be in the range of 11.4 with greater than 120% increased cardiovascular risk long-term. A1c and microalbumin will be checked today. Glucose monitoring discussed

## 2012-03-06 NOTE — Progress Notes (Signed)
Subjective:    Patient ID: [name], male    DOB: 29-May-1950, 61 y.o.   MRN: [id number]  HPI The patient is here for followup of diabetes, hyperlipidemia, and hypertension.  The most recent A1c 09/13/2008  Was 5.9  , which correlates to an average sugar of 123  , and long-term risk of 18%.  Fasting blood sugar ranges  or average :no monitor Highest two-hour postprandial glucose:no monitor No hypoglycemia Last ophthalmologic examination June 2013   revealed no retinopathy. No active podiatry assessment on record. Diet is low fat/low cholesterol . Exercise  Walking 3-4 times a week for 30 min  The most recent lipids 10/19/2010  reveal LDL  158.7 , HDL 38  , and triglycerides 269   . Medical compliance with the statin- no statin. On Fenofibrate No blood pressure monitor.  There is medical compliance with antihypertensive medications- yes         Review of Systems Constitutional: No fever, chills, significant weight change, fatigue, weakness or night sweats Eyes: No  blurred vision, double vision, or loss of vision Cardiovascular: no chest pain, palpitations, racing, irregular rhythm,syncope,nausea,sweating, claudication, or edema  Respiratory: No exertinal dyspnea, paroxysmal nocturnal dyspnea Gastrointestinal: No heartburn,dysphagia, diarrhea, significant constipation, rectal bleeding, melena Genitourinary: No dysuria,hematuria, pyuria, frequency,  incontinence, nocturia Musculoskeletal: Occasional muscle cramping ,no  weakness, or cyanosis Dermatologic: No rash, pruritus, urticaria, or change in color or temperature of skin Neurologic: No  limb weakness,  numbness or tingling Endocrine: No change in hair/skin/ nails, excessive thirst, excessive hunger, excessive urination, or unexplained fatigue     Objective:   Physical Exam Gen.:  well-nourished in appearance. Alert, appropriate and cooperative throughout exam. Head: Normocephalic without obvious abnormalities; pattern  alopecia  Eyes: No corneal or conjunctival inflammation noted. Pupils equal round reactive to light and accommodation.  Nose: External nasal exam reveals no deformity or inflammation. Nasal mucosa are pink and moist. No lesions or exudates noted. Mouth: Oral mucosa and oropharynx reveal no lesions or exudates. Teeth in good repair. Neck: No deformities, masses, or tenderness noted.  Thyroid  normal. Lungs: Normal respiratory effort; chest expands symmetrically. Lungs are clear to auscultation without rales, wheezes, or increased work of breathing. Heart: Normal rate and rhythm. Normal S1 and S2. No gallop, click, or rub. Grade 1 holosystolic mitral valve murmur.  Abdomen: Bowel sounds normal; abdomen soft and nontender. No masses, organomegaly or hernias noted.                                                                                Musculoskeletal/extremities: No clubbing, cyanosis, edema, or deformity noted. Range of motion  normal .Tone & strength  normal.Joints normal. Nail health  good. Vascular: Carotid, radial artery, dorsalis pedis and  posterior tibial pulses are full and equal. No bruits present. Neurologic: Alert and oriented x3. Deep tendon reflexes symmetrical and normal.          Skin: Intact without suspicious lesions or rashes. Lymph: No cervical, axillary lymphadenopathy present. Psych: Mood and affect are normal. Normally interactive  Assessment & Plan:

## 2012-03-10 ENCOUNTER — Other Ambulatory Visit: Payer: Self-pay

## 2012-03-10 MED ORDER — ONETOUCH DELICA LANCETS MISC: Status: DC

## 2012-03-10 MED ORDER — GLUCOSE BLOOD VI STRP: ORAL_STRIP | Status: DC

## 2012-03-10 NOTE — Telephone Encounter (Signed)
Rx sent for glucose strips not sure what needles to order. Need your help plz order pt needles 3 month supply per Hop thx. I called and spoke to pt he is checking into either switching to [facility] or [facility] in [place] pt states he will keep [place] posted.  MW

## 2012-03-10 NOTE — Telephone Encounter (Signed)
Pt called [facility] requesting refill on needles and test strips. Pt stated OV last Thursday Hop advised he can transfer to [facility]. Pt states he would like to do that and wants to know what MD does Hop recommend? Pt also asking for his medical records be sent to [facility]. Pt requesting CB from Hop nurse PLz advise pt CB: [phone] MW

## 2012-03-10 NOTE — Telephone Encounter (Signed)
Fill Rx for DM supplies X 3 mos ; Dr Para March is accepting new patients @ [facility]

## 2012-03-10 NOTE — Telephone Encounter (Signed)
RX for lancets sent

## 2012-03-11 ENCOUNTER — Telehealth: Payer: Self-pay | Admitting: Family Medicine

## 2012-03-11 NOTE — Telephone Encounter (Signed)
Message copied by Marshell Garfinkel on Tue Mar 11, 2012  3:23 PM ------      Message from: Pecola Lawless      Created: Tue Mar 11, 2012  8:51 AM       Please ask him to   schedule fasting Labs : BMET,Lipids, hepatic panel, A1c, TSH, urine microalbumin @ [facility] ASAP. Codes: 250.02,272.4,995.20.            These have been ordered 03/06/12 at his office visit here

## 2012-03-11 NOTE — Telephone Encounter (Signed)
Pt called has already made appt at [facility] - PCP change

## 2012-03-11 NOTE — Telephone Encounter (Signed)
LM w/patient's spouse for pt to call office.

## 2012-03-12 ENCOUNTER — Other Ambulatory Visit: Payer: Self-pay | Admitting: Family Medicine

## 2012-03-12 DIAGNOSIS — E8881 Metabolic syndrome: Secondary | ICD-10-CM

## 2012-03-13 ENCOUNTER — Other Ambulatory Visit (INDEPENDENT_AMBULATORY_CARE_PROVIDER_SITE_OTHER): Payer: Federal, State, Local not specified - PPO

## 2012-03-13 DIAGNOSIS — E8881 Metabolic syndrome: Secondary | ICD-10-CM

## 2012-03-13 LAB — COMPREHENSIVE METABOLIC PANEL
ALT: 63 U/L — ABNORMAL HIGH (ref 0–53)
AST: 64 U/L — ABNORMAL HIGH (ref 0–37)
Alkaline Phosphatase: 57 U/L (ref 39–117)
Calcium: 9.1 mg/dL (ref 8.4–10.5)
Chloride: 102 mEq/L (ref 96–112)
Creatinine, Ser: 1 mg/dL (ref 0.4–1.5)
Potassium: 3.9 mEq/L (ref 3.5–5.1)

## 2012-03-13 LAB — TSH: TSH: 0.79 u[IU]/mL (ref 0.35–5.50)

## 2012-03-13 LAB — MICROALBUMIN / CREATININE URINE RATIO: Creatinine,U: 93.4 mg/dL

## 2012-03-13 LAB — LIPID PANEL
HDL: 30.3 mg/dL — ABNORMAL LOW (ref >39.00)
Total CHOL/HDL Ratio: 8
Triglycerides: 170 mg/dL — ABNORMAL HIGH (ref 0.0–149.0)
VLDL: 34 mg/dL (ref 0.0–40.0)

## 2012-03-13 LAB — HEMOGLOBIN A1C: Hgb A1c MFr Bld: 9.3 % — ABNORMAL HIGH (ref 4.6–6.5)

## 2012-03-17 ENCOUNTER — Ambulatory Visit (INDEPENDENT_AMBULATORY_CARE_PROVIDER_SITE_OTHER): Payer: Federal, State, Local not specified - PPO | Admitting: Family Medicine

## 2012-03-17 ENCOUNTER — Encounter: Payer: Self-pay | Admitting: Family Medicine

## 2012-03-17 VITALS — BP 140/82 | HR 88 | Temp 98.3°F | Ht 68.0 in | Wt 220.0 lb

## 2012-03-17 DIAGNOSIS — E1165 Type 2 diabetes mellitus with hyperglycemia: Secondary | ICD-10-CM

## 2012-03-17 DIAGNOSIS — E119 Type 2 diabetes mellitus without complications: Secondary | ICD-10-CM

## 2012-03-17 MED ORDER — METFORMIN HCL 500 MG PO TABS: 500.0000 mg | ORAL_TABLET | Freq: Two times a day (BID) | ORAL | Status: DC

## 2012-03-17 NOTE — Progress Notes (Signed)
New dx DM2.  See labs.   Diabetes:  Using medications without difficulties:no meds yet Hypoglycemic episodes:no Hyperglycemic episodes:yes Feet problems:no Blood Sugars averaging: 203 this last AM Labs dw pt.    PMH and SH reviewed  Meds, vitals, and allergies reviewed.   ROS: See HPI.  Otherwise negative.    GEN: nad, alert and oriented HEENT: mucous membranes moist NECK: supple w/o LA CV: rrr. PULM: ctab, no inc wob ABD: soft, +bs EXT: no edema SKIN: no acute rash  Diabetic foot exam: Normal inspection No skin breakdown No calluses  Normal DP pulses Normal sensation to light touch and monofilament Nails normal

## 2012-03-17 NOTE — Assessment & Plan Note (Signed)
New dx.  D/w pt about diet/exercise/weight along with DM 2 basics.  He'll look at ADA website, handout given re: diet.  Start metformin.  Recheck A1c in 3 months.  Path/phys d/w pt along with education about risk from CV disease with DM2.  Understood. >25 min spent with face to face with patient, >50% counseling and/or coordinating care

## 2012-03-17 NOTE — Patient Instructions (Addendum)
Look up diabetes.org.  Read up on diet and exercise.  Start using the eat right diet and see about making substitutions for sugars/breads/carbs.  Recheck A1c in 3 months before a visit.  Take metformin once a day for 1 week then twice a day. If GI upset, cut back on the dose.

## 2012-03-30 ENCOUNTER — Other Ambulatory Visit: Payer: Self-pay | Admitting: Internal Medicine

## 2012-03-31 ENCOUNTER — Other Ambulatory Visit: Payer: Self-pay | Admitting: Family Medicine

## 2012-09-29 ENCOUNTER — Ambulatory Visit (INDEPENDENT_AMBULATORY_CARE_PROVIDER_SITE_OTHER): Payer: Federal, State, Local not specified - PPO | Admitting: Family Medicine

## 2012-09-29 ENCOUNTER — Encounter: Payer: Self-pay | Admitting: Family Medicine

## 2012-09-29 VITALS — BP 156/88 | HR 90 | Temp 98.4°F | Wt 220.2 lb

## 2012-09-29 DIAGNOSIS — M549 Dorsalgia, unspecified: Secondary | ICD-10-CM

## 2012-09-29 DIAGNOSIS — E119 Type 2 diabetes mellitus without complications: Secondary | ICD-10-CM

## 2012-09-29 DIAGNOSIS — IMO0002 Reserved for concepts with insufficient information to code with codable children: Secondary | ICD-10-CM

## 2012-09-29 DIAGNOSIS — E1165 Type 2 diabetes mellitus with hyperglycemia: Secondary | ICD-10-CM

## 2012-09-29 NOTE — Assessment & Plan Note (Signed)
He'll schedule f/u.

## 2012-09-29 NOTE — Patient Instructions (Addendum)
Schedule a fasting lab visit ahead of a visit with Para March.  Take up to 2 aleve a day with food.  Try ice or heat. Use the stretches. Let me know if this doesn't help.

## 2012-09-29 NOTE — Assessment & Plan Note (Signed)
[employer] with GI caution and handout given for stretching lower back and hamstrings.  F/u prn.  No need to image today.

## 2012-09-29 NOTE — Progress Notes (Signed)
His sugar is improved per his report.  ~140-150 in the AMs now.  Compliant with metformin.   Back pain.  He had back surgery 04/2011.  He's working part time.  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  Recently with burning in L lower back and some pain in the L leg near the knee.  Going on a few weeks. It's not getting better/worse overall.  Pain in lower back with prolonged standing.  No pain sitting.  No R sided sx. No bowel or bladder sx. No FCNAV.  Taking aleve 1 pill a day or less.    Meds, vitals, and allergies reviewed.   ROS: See HPI.  Otherwise, noncontributory.  nad ncat Mmm rrr ctab abd soft, not ttp Ext w/o edema Back not ttp in midline Mildly ttp in L lower back.  Hamstrings tight Some mild pain with facet loading on L side Distally NV intact No rash

## 2012-10-29 ENCOUNTER — Other Ambulatory Visit: Payer: Self-pay | Admitting: Internal Medicine

## 2012-10-30 ENCOUNTER — Other Ambulatory Visit: Payer: Self-pay

## 2012-10-30 NOTE — Telephone Encounter (Signed)
Sent!

## 2013-02-24 ENCOUNTER — Encounter: Payer: Self-pay | Admitting: Family Medicine

## 2013-04-22 ENCOUNTER — Other Ambulatory Visit: Payer: Self-pay | Admitting: Internal Medicine

## 2013-04-22 NOTE — Telephone Encounter (Signed)
Folic Acid refilled per protocol for one month. OV due. JG//CMA

## 2013-05-12 ENCOUNTER — Other Ambulatory Visit: Payer: Self-pay | Admitting: Family Medicine

## 2013-05-31 ENCOUNTER — Other Ambulatory Visit: Payer: Self-pay | Admitting: Internal Medicine

## 2013-06-01 NOTE — Telephone Encounter (Signed)
Rx sent to the pharmacy by e-script.//AB/CMA 

## 2013-06-08 ENCOUNTER — Other Ambulatory Visit: Payer: Self-pay | Admitting: Family Medicine

## 2013-06-09 ENCOUNTER — Other Ambulatory Visit: Payer: Self-pay | Admitting: Family Medicine

## 2013-06-10 NOTE — Telephone Encounter (Signed)
Left message at home number, needs appointment.

## 2013-06-15 ENCOUNTER — Ambulatory Visit (INDEPENDENT_AMBULATORY_CARE_PROVIDER_SITE_OTHER): Payer: Federal, State, Local not specified - PPO | Admitting: Family Medicine

## 2013-06-15 ENCOUNTER — Encounter: Payer: Self-pay | Admitting: Family Medicine

## 2013-06-15 VITALS — BP 132/76 | HR 73 | Temp 97.9°F | Wt 204.5 lb

## 2013-06-15 DIAGNOSIS — E1165 Type 2 diabetes mellitus with hyperglycemia: Secondary | ICD-10-CM

## 2013-06-15 DIAGNOSIS — E119 Type 2 diabetes mellitus without complications: Secondary | ICD-10-CM

## 2013-06-15 DIAGNOSIS — IMO0002 Reserved for concepts with insufficient information to code with codable children: Secondary | ICD-10-CM

## 2013-06-15 DIAGNOSIS — I10 Essential (primary) hypertension: Secondary | ICD-10-CM

## 2013-06-15 DIAGNOSIS — Z23 Encounter for immunization: Secondary | ICD-10-CM

## 2013-06-15 DIAGNOSIS — IMO0001 Reserved for inherently not codable concepts without codable children: Secondary | ICD-10-CM

## 2013-06-15 LAB — COMPREHENSIVE METABOLIC PANEL
ALT: 30 U/L (ref 0–53)
AST: 26 U/L (ref 0–37)
Albumin: 3.8 g/dL (ref 3.5–5.2)
Alkaline Phosphatase: 55 U/L (ref 39–117)
BILIRUBIN TOTAL: 1 mg/dL (ref 0.3–1.2)
BUN: 17 mg/dL (ref 6–23)
CALCIUM: 9.4 mg/dL (ref 8.4–10.5)
CHLORIDE: 103 meq/L (ref 96–112)
CO2: 24 meq/L (ref 19–32)
CREATININE: 1 mg/dL (ref 0.4–1.5)
GFR: 81.8 mL/min (ref >60.00)
GLUCOSE: 299 mg/dL — AB (ref 70–99)
Potassium: 4.2 mEq/L (ref 3.5–5.1)
Sodium: 136 mEq/L (ref 135–145)
Total Protein: 7.6 g/dL (ref 6.0–8.3)

## 2013-06-15 LAB — LDL CHOLESTEROL, DIRECT: Direct LDL: 153.7 mg/dL

## 2013-06-15 LAB — LIPID PANEL
CHOLESTEROL: 251 mg/dL — AB (ref 0–200)
HDL: 34.5 mg/dL — AB (ref >39.00)
Total CHOL/HDL Ratio: 7
Triglycerides: 382 mg/dL — ABNORMAL HIGH (ref 0.0–149.0)
VLDL: 76.4 mg/dL — AB (ref 0.0–40.0)

## 2013-06-15 LAB — HEMOGLOBIN A1C: Hgb A1c MFr Bld: 12.1 % — ABNORMAL HIGH (ref 4.6–6.5)

## 2013-06-15 MED ORDER — METOPROLOL TARTRATE 50 MG PO TABS: 50.0000 mg | ORAL_TABLET | Freq: Two times a day (BID) | ORAL | Status: DC

## 2013-06-15 MED ORDER — BENAZEPRIL HCL 20 MG PO TABS: 20.0000 mg | ORAL_TABLET | Freq: Every day | ORAL | Status: DC

## 2013-06-15 NOTE — Assessment & Plan Note (Signed)
Weight loss noted, see notes on labs.  Continue current meds for now.  He agrees.  Recheck in about 6 months, depending on labs.

## 2013-06-15 NOTE — Progress Notes (Signed)
Pre visit review using our clinic review tool, if applicable. No additional management support is needed unless otherwise documented below in the visit note.  Diabetes:  Using medications without difficulties: yes Hypoglycemic episodes:no Hyperglycemic episodes:no Feet problems:no Blood Sugars averaging: ~130 eye exam within last year:yes, fall 2014 He's been working on diet and weight.  Intentional weight loss noted.   Hypertension:    Using medication without problems or lightheadedness: yes Chest pain with exertion:no Edema:no Short of breath:no  Meds, vitals, and allergies reviewed.   ROS: See HPI.  Otherwise negative.    GEN: nad, alert and oriented HEENT: mucous membranes moist NECK: supple w/o LA CV: rrr. PULM: ctab, no inc wob ABD: soft, +bs EXT: no edema SKIN: no acute rash  Diabetic foot exam: Normal inspection No skin breakdown No calluses  Normal DP pulses Normal sensation to light touch and monofilament Nails normal

## 2013-06-15 NOTE — Patient Instructions (Addendum)
Check with your insurance to see if they will cover the shingles shot. Go to the lab on the way out.  We'll contact you with your lab report. I would recheck in about 6 months at a physical.  Labs ahead of time.  Take care.  Glad to see you.

## 2013-06-15 NOTE — Assessment & Plan Note (Signed)
Controlled, continue as is.  See notes on labs.   

## 2013-06-16 ENCOUNTER — Telehealth: Payer: Self-pay | Admitting: Family Medicine

## 2013-06-16 NOTE — Telephone Encounter (Signed)
Relevant patient education assigned to patient using Emmi. ° °

## 2013-06-21 ENCOUNTER — Other Ambulatory Visit: Payer: Self-pay | Admitting: Internal Medicine

## 2013-06-22 ENCOUNTER — Encounter: Payer: Self-pay | Admitting: Family Medicine

## 2013-06-22 ENCOUNTER — Other Ambulatory Visit: Payer: Self-pay | Admitting: *Deleted

## 2013-06-22 ENCOUNTER — Ambulatory Visit (INDEPENDENT_AMBULATORY_CARE_PROVIDER_SITE_OTHER): Payer: Federal, State, Local not specified - PPO | Admitting: Family Medicine

## 2013-06-22 VITALS — BP 134/80 | HR 90 | Temp 98.4°F | Wt 203.5 lb

## 2013-06-22 DIAGNOSIS — E1165 Type 2 diabetes mellitus with hyperglycemia: Secondary | ICD-10-CM

## 2013-06-22 DIAGNOSIS — IMO0002 Reserved for concepts with insufficient information to code with codable children: Secondary | ICD-10-CM

## 2013-06-22 DIAGNOSIS — IMO0001 Reserved for inherently not codable concepts without codable children: Secondary | ICD-10-CM

## 2013-06-22 MED ORDER — METFORMIN HCL 500 MG PO TABS: ORAL_TABLET | ORAL | Status: DC

## 2013-06-22 NOTE — Assessment & Plan Note (Signed)
A1c very high, but with drastic changes in diet.  Now with AM sugar 100 points lower recently.   >25 minutes spent in face to face time with patient, >50% spent in counselling or coordination of care Inc metformin as tolerated, up to 2000mg  a day.  Will update me at that point.  Will likely need januvia at that point.  Discussed multiple options with patient.  Would likely need insulin at some point, would have started today but with sig change in sugar with diet we can try oral meds.  Diet d/w pt.  Handout given.  He agrees.

## 2013-06-22 NOTE — Progress Notes (Signed)
Pre visit review using our clinic review tool, if applicable. No additional management support is needed unless otherwise documented below in the visit note.  DM2 f/u.  A1c 12.  Discussed labs.  Recently with drastic changes in diet, now with AM sugars ~180 (down about 100 points).  Compliant with meds.  No ADE.  See plan.   Meds, vitals, and allergies reviewed.   ROS: See HPI.  Otherwise, noncontributory.  nad Exam deferred.

## 2013-06-22 NOTE — Patient Instructions (Addendum)
Take up to 2 tabs twice a day (2+1 for a week, then 2+2 for a week).  If diarrhea, then cut back by 1 pill per day.  Update me with your sugars in about 3-4 weeks.   We'll go from there.  Januvia or similar would likely be the next oral medicine if needed.  Keep working on [employer].   Look at the eat right diet and diabetes.org.  Recheck A1c in 3 months before a visit.

## 2013-06-24 ENCOUNTER — Telehealth: Payer: Self-pay

## 2013-06-24 NOTE — Telephone Encounter (Signed)
Relevant patient education assigned to patient using Emmi. ° °

## 2013-07-29 ENCOUNTER — Ambulatory Visit (INDEPENDENT_AMBULATORY_CARE_PROVIDER_SITE_OTHER): Payer: Federal, State, Local not specified - PPO | Admitting: Family Medicine

## 2013-07-29 ENCOUNTER — Encounter: Payer: Self-pay | Admitting: Family Medicine

## 2013-07-29 VITALS — BP 130/74 | HR 92 | Temp 98.6°F | Wt 197.0 lb

## 2013-07-29 DIAGNOSIS — E119 Type 2 diabetes mellitus without complications: Secondary | ICD-10-CM

## 2013-07-29 MED ORDER — METFORMIN HCL 500 MG PO TABS: ORAL_TABLET | ORAL | Status: DC

## 2013-07-29 MED ORDER — ONETOUCH ULTRASOFT LANCETS MISC: Status: DC

## 2013-07-29 NOTE — Progress Notes (Signed)
Pre visit review using our clinic review tool, if applicable. No additional management support is needed unless otherwise documented below in the visit note.  DM2 f/u.  Sugar much improved, down <100 now in the AM.  Taking 2 metformin in AM, 1 in PM.  No ADE on meds.  Weight down intentionally.  Still exercising.  Working hard on diet.  Eating basically no sugar, no carbs, no potatoes, no pasta.  He thinks he can keep the diet up, he isn't missing any particular food items.  "I don't miss the sugar."  He had to by some new clothes.  Waist is down about ~3 inches.  "I feel better."    Discussed possible taper of ACE and metformin if needed.    Meds, vitals, and allergies reviewed.   ROS: See HPI.  Otherwise, noncontributory.  GEN: nad, alert and oriented HEENT: mucous membranes moist NECK: supple w/o LA CV: rrr PULM: ctab, no inc wob ABD: soft, +bs EXT: no edema SKIN: no acute rash

## 2013-07-29 NOTE — Patient Instructions (Signed)
Recheck in ~mid June, A1c before a visit.  If you get light headed, then let us know so we can cut your BP medicine.  If you have low sugars, then cut the metformin back to 1 tab twice a day.  THANK YOU FOR YOUR EFFORT.

## 2013-07-30 NOTE — Assessment & Plan Note (Signed)
Much improved by recent sugars.  Discussed possible taper of ACE and metformin if needed.  Recheck A1c later this year, as it currently lags his progress.  He agrees.  I thanked him for his effort.

## 2013-08-04 ENCOUNTER — Other Ambulatory Visit: Payer: Self-pay | Admitting: Family Medicine

## 2013-08-31 ENCOUNTER — Other Ambulatory Visit: Payer: Self-pay | Admitting: Family Medicine

## 2013-08-31 ENCOUNTER — Other Ambulatory Visit: Payer: Self-pay | Admitting: Internal Medicine

## 2013-10-02 ENCOUNTER — Other Ambulatory Visit: Payer: Self-pay | Admitting: *Deleted

## 2013-10-12 ENCOUNTER — Other Ambulatory Visit (INDEPENDENT_AMBULATORY_CARE_PROVIDER_SITE_OTHER): Payer: Federal, State, Local not specified - PPO

## 2013-10-12 DIAGNOSIS — E119 Type 2 diabetes mellitus without complications: Secondary | ICD-10-CM

## 2013-10-12 LAB — HEMOGLOBIN A1C: HEMOGLOBIN A1C: 5.8 % (ref 4.6–6.5)

## 2013-10-19 ENCOUNTER — Ambulatory Visit (INDEPENDENT_AMBULATORY_CARE_PROVIDER_SITE_OTHER): Payer: Federal, State, Local not specified - PPO | Admitting: Family Medicine

## 2013-10-19 ENCOUNTER — Encounter: Payer: Self-pay | Admitting: Family Medicine

## 2013-10-19 VITALS — BP 122/80 | HR 65 | Temp 98.2°F | Wt 192.8 lb

## 2013-10-19 DIAGNOSIS — E119 Type 2 diabetes mellitus without complications: Secondary | ICD-10-CM

## 2013-10-19 NOTE — Assessment & Plan Note (Signed)
Much improved, he may not have DM2 now.  Will start taper of metformin and recheck A1c in about 3 months, along with lipids.  If he gets lightheaded, not going on now, then we can taper his BP meds.  I thanked patient for his effort.  Labs d/w pt.  See instructions. He agrees.

## 2013-10-19 NOTE — Progress Notes (Signed)
Pre visit review using our clinic review tool, if applicable. No additional management support is needed unless otherwise documented below in the visit note.  Diabetes:  Using medications without difficulties:yes, 3 tabs a day Hypoglycemic episodes:no Hyperglycemic episodes:no Feet problems: no Blood Sugars averaging: 80-90s in AM eye exam within last year: going next week Down 30lbs in the last year A1c much improved!!! Diet and exercise much better.  He thinks he can continue his diet.  He isn't craving sugar.    Not lightheaded on standing.  Meds, vitals, and allergies reviewed.   ROS: See HPI.  Otherwise negative.    GEN: nad, alert and oriented HEENT: mucous membranes moist NECK: supple w/o LA CV: rrr. PULM: ctab, no inc wob ABD: soft, +bs EXT: no edema SKIN: no acute rash

## 2013-10-19 NOTE — Patient Instructions (Signed)
Taper off the metformin- 3-->2-->1-->0 tab per day, about 1 week at each dose.  If your sugar goes up dramatically, then restart the previous dose.   Call back as needed.  Recheck in about 3 months, fasting labs ahead of time.  Take care.  Glad to see you.

## 2013-11-06 ENCOUNTER — Other Ambulatory Visit: Payer: Self-pay | Admitting: Family Medicine

## 2013-12-14 ENCOUNTER — Encounter: Payer: Self-pay | Admitting: Family Medicine

## 2013-12-14 ENCOUNTER — Ambulatory Visit (INDEPENDENT_AMBULATORY_CARE_PROVIDER_SITE_OTHER): Payer: Federal, State, Local not specified - PPO | Admitting: Family Medicine

## 2013-12-14 VITALS — BP 140/80 | HR 72 | Temp 98.0°F | Wt 196.8 lb

## 2013-12-14 DIAGNOSIS — Z0000 Encounter for general adult medical examination without abnormal findings: Secondary | ICD-10-CM | POA: Insufficient documentation

## 2013-12-14 DIAGNOSIS — Z7189 Other specified counseling: Secondary | ICD-10-CM | POA: Insufficient documentation

## 2013-12-14 DIAGNOSIS — E119 Type 2 diabetes mellitus without complications: Secondary | ICD-10-CM

## 2013-12-14 DIAGNOSIS — I10 Essential (primary) hypertension: Secondary | ICD-10-CM

## 2013-12-14 LAB — LDL CHOLESTEROL, DIRECT: LDL DIRECT: 171.9 mg/dL

## 2013-12-14 LAB — LIPID PANEL
Cholesterol: 238 mg/dL — ABNORMAL HIGH (ref 0–200)
HDL: 42.3 mg/dL (ref >39.00)
NONHDL: 195.7
Total CHOL/HDL Ratio: 6
Triglycerides: 230 mg/dL — ABNORMAL HIGH (ref 0.0–149.0)
VLDL: 46 mg/dL — ABNORMAL HIGH (ref 0.0–40.0)

## 2013-12-14 LAB — HEMOGLOBIN A1C: HEMOGLOBIN A1C: 5.8 % (ref 4.6–6.5)

## 2013-12-14 NOTE — Assessment & Plan Note (Signed)
Routine anticipatory guidance given to patient.  See health maintenance. Tetanus 2009 PNA 2015 Shingles shot d/w pt.   Flu shot encouraged.   Colonoscopy 2006 Prostate cancer screening and PSA options (with potential risks and benefits of testing vs not testing) were discussed along with recent recs/guidelines.  He declined testing PSA at this point. Diet and exercise d/w pt.  Exercise- walking at work.  Diet is good.  Living will d/w pt.  Wife designated if patient were incapacitated.

## 2013-12-14 NOTE — Progress Notes (Signed)
Pre visit review using our clinic review tool, if applicable. No additional management support is needed unless otherwise documented below in the visit note.  CPE- See plan.  Routine anticipatory guidance given to patient.  See health maintenance. Tetanus 2009 PNA 2015 Shingles shot d/w pt.   Flu shot encouraged.   Colonoscopy 2006 Prostate cancer screening and PSA options (with potential risks and benefits of testing vs not testing) were discussed along with recent recs/guidelines.  He declined testing PSA at this point. Diet and exercise d/w pt.  Exercise- walking at work.  Diet is good.  Living will d/w pt.  Wife designated if patient were incapacitated.   Diabetes:  Using medications without difficulties: yes Hypoglycemic episodes: no Hyperglycemic episodes: no Feet problems: no Blood Sugars averaging: 83 this AM.   eye exam within last year: recently, last month.   Hypertension:    Using medication without problems or lightheadedness: yes Chest pain with exertion:no Edema:no Short of breath:no Sig work on weight loss.  I thanked him.    PMH and SH reviewed  Meds, vitals, and allergies reviewed.   ROS: See HPI.  Otherwise negative.    GEN: nad, alert and oriented HEENT: mucous membranes moist NECK: supple w/o LA CV: rrr. PULM: ctab, no inc wob ABD: soft, +bs EXT: no edema SKIN: no acute rash  Diabetic foot exam: Normal inspection No skin breakdown No calluses  Normal DP pulses Normal sensation to light touch and monofilament Nails normal

## 2013-12-14 NOTE — Patient Instructions (Addendum)
Check with your insurance to see if they will cover the shingles shot. I would get a flu shot each fall.   Go to the lab on the way out.  We'll contact you with your lab report. We may be able to get you off the metformin- we'll see on the labs.   Cancel the upcoming appointments.  We should likely recheck in about 6 months with labs ahead of time or fasting at the visit.

## 2013-12-14 NOTE — Assessment & Plan Note (Signed)
Recheck okay, f/u lipids pending.  No change in meds.  Diet and exercise d/w pt.  Okay to continue as is.  Doing well. Last Cr wnl prev in 2015.

## 2013-12-14 NOTE — Assessment & Plan Note (Signed)
Prev labs/A1c d/w pt.  Much improved, sig weight loss noted, intentional.  See notes on pending labs.  Diet and exercise d/w pt.  Continue metformin for now, we may be able to taper.  Normal foot exam.  Recheck in about 6 months.  He agrees.

## 2013-12-15 ENCOUNTER — Other Ambulatory Visit: Payer: Self-pay | Admitting: Family Medicine

## 2013-12-15 DIAGNOSIS — E119 Type 2 diabetes mellitus without complications: Secondary | ICD-10-CM

## 2014-01-18 ENCOUNTER — Other Ambulatory Visit: Payer: Federal, State, Local not specified - PPO

## 2014-01-25 ENCOUNTER — Ambulatory Visit: Payer: Federal, State, Local not specified - PPO | Admitting: Family Medicine

## 2014-02-12 ENCOUNTER — Other Ambulatory Visit: Payer: Self-pay | Admitting: Family Medicine

## 2014-05-03 ENCOUNTER — Other Ambulatory Visit: Payer: Self-pay | Admitting: Family Medicine

## 2014-05-10 ENCOUNTER — Other Ambulatory Visit: Payer: Self-pay | Admitting: Family Medicine

## 2014-06-08 ENCOUNTER — Other Ambulatory Visit: Payer: Federal, State, Local not specified - PPO

## 2014-06-15 ENCOUNTER — Ambulatory Visit: Payer: Federal, State, Local not specified - PPO | Admitting: Family Medicine

## 2014-06-21 ENCOUNTER — Other Ambulatory Visit: Payer: Self-pay | Admitting: Family Medicine

## 2014-06-21 ENCOUNTER — Other Ambulatory Visit (INDEPENDENT_AMBULATORY_CARE_PROVIDER_SITE_OTHER): Payer: Federal, State, Local not specified - PPO

## 2014-06-21 DIAGNOSIS — E119 Type 2 diabetes mellitus without complications: Secondary | ICD-10-CM

## 2014-06-21 LAB — LIPID PANEL
CHOL/HDL RATIO: 6
Cholesterol: 230 mg/dL — ABNORMAL HIGH (ref 0–200)
HDL: 41.5 mg/dL (ref >39.00)
NONHDL: 188.5
TRIGLYCERIDES: 210 mg/dL — AB (ref 0.0–149.0)
VLDL: 42 mg/dL — AB (ref 0.0–40.0)

## 2014-06-21 LAB — COMPREHENSIVE METABOLIC PANEL
ALT: 23 U/L (ref 0–53)
AST: 25 U/L (ref 0–37)
Albumin: 4.2 g/dL (ref 3.5–5.2)
Alkaline Phosphatase: 38 U/L — ABNORMAL LOW (ref 39–117)
BUN: 20 mg/dL (ref 6–23)
CALCIUM: 9.7 mg/dL (ref 8.4–10.5)
CO2: 26 meq/L (ref 19–32)
Chloride: 105 mEq/L (ref 96–112)
Creatinine, Ser: 1.12 mg/dL (ref 0.40–1.50)
GFR: 69.89 mL/min (ref >60.00)
Glucose, Bld: 124 mg/dL — ABNORMAL HIGH (ref 70–99)
Potassium: 4.2 mEq/L (ref 3.5–5.1)
Sodium: 139 mEq/L (ref 135–145)
Total Bilirubin: 0.8 mg/dL (ref 0.2–1.2)
Total Protein: 8 g/dL (ref 6.0–8.3)

## 2014-06-21 LAB — LDL CHOLESTEROL, DIRECT: Direct LDL: 163 mg/dL

## 2014-06-21 LAB — HEMOGLOBIN A1C: Hgb A1c MFr Bld: 6.3 % (ref 4.6–6.5)

## 2014-06-28 ENCOUNTER — Ambulatory Visit (INDEPENDENT_AMBULATORY_CARE_PROVIDER_SITE_OTHER): Payer: Federal, State, Local not specified - PPO | Admitting: Family Medicine

## 2014-06-28 ENCOUNTER — Encounter: Payer: Self-pay | Admitting: Family Medicine

## 2014-06-28 VITALS — BP 150/76 | HR 92 | Temp 97.9°F | Wt 203.5 lb

## 2014-06-28 DIAGNOSIS — I10 Essential (primary) hypertension: Secondary | ICD-10-CM

## 2014-06-28 DIAGNOSIS — E782 Mixed hyperlipidemia: Secondary | ICD-10-CM

## 2014-06-28 DIAGNOSIS — E119 Type 2 diabetes mellitus without complications: Secondary | ICD-10-CM

## 2014-06-28 NOTE — Progress Notes (Signed)
Pre visit review using our clinic review tool, if applicable. No additional management support is needed unless otherwise documented below in the visit note.  Diabetes:  Using medications without difficulties: yes Hypoglycemic episodes:no Hyperglycemic episodes:no Feet problems:no Blood Sugars averaging: 87 this AM.  Usually 80-90s in AM.  eye exam within last year:yes  Hypertension:    Using medication without problems or lightheadedness: yes Chest pain with exertion:no Edema:no Short of breath:no Average home BPs: not checked often out of clinic.   Diet and exercise- walking at work.  Diet is good, cutting out sugars.  D/w pt.    Meds, vitals, and allergies reviewed.   ROS: See HPI.  Otherwise negative.    GEN: nad, alert and oriented HEENT: mucous membranes moist NECK: supple w/o LA CV: rrr. PULM: ctab, no inc wob ABD: soft, +bs EXT: no edema SKIN: no acute rash  Diabetic foot exam: Normal inspection No skin breakdown No calluses  Normal DP pulses Normal sensation to light touch and monofilament Nails normal

## 2014-06-28 NOTE — Assessment & Plan Note (Signed)
Will not add statin at this point, he'll continue work on diet and exercise.  TG improved overall.

## 2014-06-28 NOTE — Assessment & Plan Note (Signed)
A1c still at goal, d/w pt.  Continue current meds, no change.  Continue diet and exercise, d/w pt.

## 2014-06-28 NOTE — Assessment & Plan Note (Signed)
He'll check BP out of clinic and notify me if still elevated.  He agrees.

## 2014-06-28 NOTE — Patient Instructions (Addendum)
Check with your insurance to see if they will cover the shingles shot. I would get a flu shot each fall.   Take care.  Glad to see you.   Keep working on [employer] and keep walking.   Check your BP a few times out of clinic and let me know if it stays >140/>90. Schedule a physical for about 6 months from now.  Lab visit ahead of time.

## 2014-06-30 ENCOUNTER — Telehealth: Payer: Self-pay | Admitting: Family Medicine

## 2014-06-30 NOTE — Telephone Encounter (Signed)
emmi emailed °

## 2014-09-20 ENCOUNTER — Other Ambulatory Visit: Payer: Self-pay | Admitting: Family Medicine

## 2014-09-28 LAB — HM DIABETES EYE EXAM

## 2014-10-28 ENCOUNTER — Encounter: Payer: Self-pay | Admitting: Family Medicine

## 2014-11-03 ENCOUNTER — Other Ambulatory Visit: Payer: Self-pay | Admitting: Family Medicine

## 2014-11-03 NOTE — Telephone Encounter (Signed)
Sent as 1 bid, that was how he had been on it.  Thanks.

## 2014-11-03 NOTE — Telephone Encounter (Signed)
Received refill request electronically from pharmacy. Request from pharmacy does not medication list. Is it okay to refill as requested by the pharmacy?

## 2014-12-18 ENCOUNTER — Other Ambulatory Visit: Payer: Self-pay | Admitting: Family Medicine

## 2014-12-20 ENCOUNTER — Other Ambulatory Visit: Payer: Federal, State, Local not specified - PPO

## 2015-01-03 ENCOUNTER — Encounter: Payer: Federal, State, Local not specified - PPO | Admitting: Family Medicine

## 2015-01-10 ENCOUNTER — Other Ambulatory Visit: Payer: Self-pay | Admitting: Family Medicine

## 2015-01-10 ENCOUNTER — Other Ambulatory Visit (INDEPENDENT_AMBULATORY_CARE_PROVIDER_SITE_OTHER): Payer: Federal, State, Local not specified - PPO

## 2015-01-10 DIAGNOSIS — E119 Type 2 diabetes mellitus without complications: Secondary | ICD-10-CM | POA: Diagnosis not present

## 2015-01-10 LAB — LIPID PANEL
Cholesterol: 223 mg/dL — ABNORMAL HIGH (ref 0–200)
HDL: 38 mg/dL — AB (ref >39.00)
NONHDL: 185.37
TRIGLYCERIDES: 252 mg/dL — AB (ref 0.0–149.0)
Total CHOL/HDL Ratio: 6
VLDL: 50.4 mg/dL — AB (ref 0.0–40.0)

## 2015-01-10 LAB — COMPREHENSIVE METABOLIC PANEL
ALK PHOS: 36 U/L — AB (ref 39–117)
ALT: 34 U/L (ref 0–53)
AST: 30 U/L (ref 0–37)
Albumin: 4.1 g/dL (ref 3.5–5.2)
BUN: 21 mg/dL (ref 6–23)
CO2: 26 meq/L (ref 19–32)
Calcium: 9.8 mg/dL (ref 8.4–10.5)
Chloride: 103 mEq/L (ref 96–112)
Creatinine, Ser: 1.09 mg/dL (ref 0.40–1.50)
GFR: 71.99 mL/min (ref >60.00)
Glucose, Bld: 132 mg/dL — ABNORMAL HIGH (ref 70–99)
POTASSIUM: 4.3 meq/L (ref 3.5–5.1)
Sodium: 137 mEq/L (ref 135–145)
Total Bilirubin: 0.7 mg/dL (ref 0.2–1.2)
Total Protein: 7.8 g/dL (ref 6.0–8.3)

## 2015-01-10 LAB — LDL CHOLESTEROL, DIRECT: Direct LDL: 158 mg/dL

## 2015-01-10 LAB — HEMOGLOBIN A1C: HEMOGLOBIN A1C: 6.2 % (ref 4.6–6.5)

## 2015-01-18 ENCOUNTER — Encounter: Payer: Self-pay | Admitting: Family Medicine

## 2015-01-18 ENCOUNTER — Ambulatory Visit (INDEPENDENT_AMBULATORY_CARE_PROVIDER_SITE_OTHER): Payer: Federal, State, Local not specified - PPO | Admitting: Family Medicine

## 2015-01-18 VITALS — BP 122/72 | HR 67 | Temp 98.0°F | Wt 206.0 lb

## 2015-01-18 DIAGNOSIS — Z119 Encounter for screening for infectious and parasitic diseases, unspecified: Secondary | ICD-10-CM

## 2015-01-18 DIAGNOSIS — E119 Type 2 diabetes mellitus without complications: Secondary | ICD-10-CM | POA: Diagnosis not present

## 2015-01-18 DIAGNOSIS — Z23 Encounter for immunization: Secondary | ICD-10-CM | POA: Diagnosis not present

## 2015-01-18 DIAGNOSIS — I10 Essential (primary) hypertension: Secondary | ICD-10-CM | POA: Diagnosis not present

## 2015-01-18 DIAGNOSIS — E782 Mixed hyperlipidemia: Secondary | ICD-10-CM | POA: Diagnosis not present

## 2015-01-18 DIAGNOSIS — Z0000 Encounter for general adult medical examination without abnormal findings: Secondary | ICD-10-CM | POA: Diagnosis not present

## 2015-01-18 MED ORDER — BENAZEPRIL HCL 20 MG PO TABS: ORAL_TABLET | ORAL | Status: DC

## 2015-01-18 MED ORDER — METOPROLOL TARTRATE 50 MG PO TABS: 50.0000 mg | ORAL_TABLET | Freq: Two times a day (BID) | ORAL | Status: DC

## 2015-01-18 MED ORDER — FOLIC ACID 1 MG PO TABS: 1.0000 mg | ORAL_TABLET | Freq: Every day | ORAL | Status: DC

## 2015-01-18 MED ORDER — GLUCOSE BLOOD VI STRP: ORAL_STRIP | Status: DC

## 2015-01-18 MED ORDER — METFORMIN HCL 500 MG PO TABS: 500.0000 mg | ORAL_TABLET | Freq: Two times a day (BID) | ORAL | Status: DC

## 2015-01-18 MED ORDER — FENOFIBRIC ACID 135 MG PO CPDR: 1.0000 | DELAYED_RELEASE_CAPSULE | Freq: Every day | ORAL | Status: DC

## 2015-01-18 MED ORDER — ONETOUCH ULTRASOFT LANCETS MISC: Status: DC

## 2015-01-18 NOTE — Progress Notes (Signed)
Pre visit review using our clinic review tool, if applicable. No additional management support is needed unless otherwise documented below in the visit note.  CPE- See plan.  Routine anticipatory guidance given to patient.  See health maintenance. Tetanus 2009 PNA 2015, d/w pt, see AVS.  Shingles shot d/w pt.  Flu shot 2016  Colonoscopy 2006.  D/w pt about f/u with GI.   Prostate cancer screening and PSA options (with potential risks and benefits of testing vs not testing) were discussed along with recent recs/guidelines.  He declined testing PSA at this point. Diet and exercise d/w pt. Exercise- walking at work. Diet is good.   Living will d/w pt. Wife designated if patient were incapacitated.  Pt opts in for HCV and HIV screening with the next set of labs.  D/w pt re: routine screening.    Hypertension:    Using medication without problems or lightheadedness: yes Chest pain with exertion:no Edema:no Short of breath:no  Elevated Cholesterol: Using medications without problems:yes Muscle aches: no Diet compliance: yes Exercise: yes  Diabetes:  Using medications without difficulties:yes Hypoglycemic episodes:no Hyperglycemic episodes:no Feet problems:no Blood Sugars averaging: usually 90-110.   eye exam within last year:yes A1c and labs d/w pt.   A1c stable.   PMH and SH reviewed  Meds, vitals, and allergies reviewed.   ROS: See HPI.  Otherwise negative.    GEN: nad, alert and oriented HEENT: mucous membranes moist NECK: supple w/o LA CV: rrr. PULM: ctab, no inc wob ABD: soft, +bs EXT: no edema SKIN: no acute rash  Diabetic foot exam: Normal inspection No skin breakdown No calluses  Normal DP pulses Normal sensation to light touch and monofilament Nails normal

## 2015-01-18 NOTE — Patient Instructions (Addendum)
Flu shot today.  I would get the other pneumonia shot (prevnar) later on this fall.  Check with your insurance to see if they will cover the shingles shot. Call GI if you don't get a letter in the meantime.   [phone] Recheck labs before a visit in about 6 months.  Take care.  Glad to see you.

## 2015-01-19 NOTE — Assessment & Plan Note (Signed)
Controlled, continue as is with meds, diet, exercise.  Labs d/w pt.

## 2015-01-19 NOTE — Assessment & Plan Note (Signed)
Routine anticipatory guidance given to patient.  See health maintenance. Tetanus 2009 PNA 2015, d/w pt, see AVS.  Shingles shot d/w pt.  Flu shot 2016  Colonoscopy 2006.  D/w pt about f/u with GI.   Prostate cancer screening and PSA options (with potential risks and benefits of testing vs not testing) were discussed along with recent recs/guidelines.  He declined testing PSA at this point. Diet and exercise d/w pt. Exercise- walking at work. Diet is good.   Living will d/w pt. Wife designated if patient were incapacitated.  Pt opts in for HCV and HIV screening with the next set of labs.  D/w pt re: routine screening.

## 2015-01-19 NOTE — Assessment & Plan Note (Signed)
LDL still up, continue as is with meds, diet, exercise.  Labs d/w pt.  I want to consider options and then update patient.  He agrees.  Has never been on statin prev per patient report.

## 2015-01-24 ENCOUNTER — Encounter: Payer: Self-pay | Admitting: Internal Medicine

## 2015-01-24 ENCOUNTER — Other Ambulatory Visit: Payer: Self-pay | Admitting: Family Medicine

## 2015-01-24 DIAGNOSIS — E785 Hyperlipidemia, unspecified: Secondary | ICD-10-CM

## 2015-01-24 NOTE — Telephone Encounter (Signed)
Please call pt.  I did some checking.  It would be reasonable to try a statin in addition to his current meds, to try to get his LDL lower.  Pravastatin is likely the best option, as far as side effects go.  If he had trouble on the medicine, it would likely be muscle aches- especially with combination of statin and fenofibrate.  Pravastatin least likely to cause aches.  If willing to start, then let me know and I'll send rx and add on lipid panel to next set of labs.  If he starts med and has aches, then cut the dose in half.  If he still can't tolerate, then stop med and update me.  Thanks.

## 2015-01-24 NOTE — Telephone Encounter (Signed)
Left message on answering machine to call back.

## 2015-01-25 NOTE — Telephone Encounter (Signed)
Left message for patient to return my call.

## 2015-01-25 NOTE — Telephone Encounter (Signed)
Pt left v/m returning call and request cb. 

## 2015-01-26 NOTE — Telephone Encounter (Signed)
Pt came by office and notified as instructed; pt voiced understanding. Pt would like to try the Pravastatin and will ck with [facility] on 01/27/15.

## 2015-01-27 MED ORDER — PRAVASTATIN SODIUM 20 MG PO TABS: 20.0000 mg | ORAL_TABLET | Freq: Every day | ORAL | Status: DC

## 2015-01-27 NOTE — Telephone Encounter (Signed)
Done, thanks

## 2015-02-13 ENCOUNTER — Other Ambulatory Visit: Payer: Self-pay | Admitting: Family Medicine

## 2015-03-18 ENCOUNTER — Other Ambulatory Visit: Payer: Self-pay | Admitting: Family Medicine

## 2015-03-20 ENCOUNTER — Other Ambulatory Visit: Payer: Self-pay | Admitting: Family Medicine

## 2015-07-11 ENCOUNTER — Other Ambulatory Visit: Payer: Federal, State, Local not specified - PPO

## 2015-07-18 ENCOUNTER — Ambulatory Visit: Payer: Federal, State, Local not specified - PPO | Admitting: Family Medicine

## 2015-07-25 ENCOUNTER — Other Ambulatory Visit: Payer: Self-pay | Admitting: Family Medicine

## 2015-07-25 ENCOUNTER — Other Ambulatory Visit (INDEPENDENT_AMBULATORY_CARE_PROVIDER_SITE_OTHER): Payer: Federal, State, Local not specified - PPO

## 2015-07-25 DIAGNOSIS — E785 Hyperlipidemia, unspecified: Secondary | ICD-10-CM

## 2015-07-25 DIAGNOSIS — E119 Type 2 diabetes mellitus without complications: Secondary | ICD-10-CM | POA: Diagnosis not present

## 2015-07-25 DIAGNOSIS — Z119 Encounter for screening for infectious and parasitic diseases, unspecified: Secondary | ICD-10-CM

## 2015-07-25 LAB — LIPID PANEL
CHOL/HDL RATIO: 5
Cholesterol: 195 mg/dL (ref 0–200)
HDL: 39.7 mg/dL (ref >39.00)
NONHDL: 155.41
TRIGLYCERIDES: 281 mg/dL — AB (ref 0.0–149.0)
VLDL: 56.2 mg/dL — AB (ref 0.0–40.0)

## 2015-07-25 LAB — LDL CHOLESTEROL, DIRECT: LDL DIRECT: 105 mg/dL

## 2015-07-25 LAB — HEMOGLOBIN A1C: Hgb A1c MFr Bld: 9.5 % — ABNORMAL HIGH (ref 4.6–6.5)

## 2015-07-26 LAB — HEPATITIS C ANTIBODY: HCV AB: NEGATIVE

## 2015-07-26 LAB — HIV ANTIBODY (ROUTINE TESTING W REFLEX): HIV 1&2 Ab, 4th Generation: NONREACTIVE

## 2015-08-01 ENCOUNTER — Encounter: Payer: Self-pay | Admitting: Family Medicine

## 2015-08-01 ENCOUNTER — Ambulatory Visit (INDEPENDENT_AMBULATORY_CARE_PROVIDER_SITE_OTHER): Payer: Federal, State, Local not specified - PPO | Admitting: Family Medicine

## 2015-08-01 VITALS — BP 130/74 | HR 73 | Temp 97.5°F | Wt 199.0 lb

## 2015-08-01 DIAGNOSIS — E119 Type 2 diabetes mellitus without complications: Secondary | ICD-10-CM | POA: Diagnosis not present

## 2015-08-01 NOTE — Patient Instructions (Signed)
Check sugar before and 2 hours after meals.  Fill out the grid and drop that off.  We'll go from there.  Your A1c may be a false elevation.  Take care.  Glad to see you.

## 2015-08-01 NOTE — Progress Notes (Signed)
Pre visit review using our clinic review tool, if applicable. No additional management support is needed unless otherwise documented below in the visit note.  Diabetes:  Using medications without difficulties:yes Hypoglycemic episodes: no Hyperglycemic episodes: no Feet problems: no Blood Sugars averaging: 100-150 eye exam within last year: f/u pending, already scheduled.   A1c higher than expected, d/w pt.  Unclear if this is a spurious value.    Meds, vitals, and allergies reviewed.   ROS: See HPI.  Otherwise negative.    GEN: nad, alert and oriented NECK: supple w/o LA CV: rrr. PULM: ctab, no inc wob EXT: no edema SKIN: no acute rash but some AKs note on the B forearms.  He'll call derm about follow up.

## 2015-08-03 NOTE — Assessment & Plan Note (Signed)
A1c higher than expected, d/w pt. Unclear if this is a spurious value.  He'll check sguars before and then 2 hours after meals and update me.  We'll go from there.  Rationale d/w pt.  He agrees.

## 2015-08-15 ENCOUNTER — Telehealth: Payer: Self-pay | Admitting: Family Medicine

## 2015-08-15 NOTE — Telephone Encounter (Signed)
Pt dropped off glucose readings for Dr. Review from 4/17-4/19. Paperwork in Rx tower.

## 2015-08-15 NOTE — Telephone Encounter (Signed)
Placed in Dr. Duncan's In Box. 

## 2015-08-16 ENCOUNTER — Other Ambulatory Visit: Payer: Self-pay | Admitting: Family Medicine

## 2015-08-16 NOTE — Telephone Encounter (Signed)
Patient advised.

## 2015-08-16 NOTE — Telephone Encounter (Signed)
Has he been working harder on diet in the meantime?  These sugars are pretty good.   If so, then continue as is, no change in meds.   Please update me.  Thanks.

## 2015-11-22 LAB — HM DIABETES EYE EXAM

## 2015-11-28 ENCOUNTER — Other Ambulatory Visit: Payer: Self-pay | Admitting: Family Medicine

## 2015-11-29 ENCOUNTER — Encounter: Payer: Self-pay | Admitting: Family Medicine

## 2015-12-29 ENCOUNTER — Ambulatory Visit (INDEPENDENT_AMBULATORY_CARE_PROVIDER_SITE_OTHER): Payer: Federal, State, Local not specified - PPO

## 2015-12-29 DIAGNOSIS — Z23 Encounter for immunization: Secondary | ICD-10-CM | POA: Diagnosis not present

## 2016-02-22 ENCOUNTER — Other Ambulatory Visit: Payer: Self-pay | Admitting: Family Medicine

## 2016-02-24 ENCOUNTER — Other Ambulatory Visit: Payer: Self-pay | Admitting: Family Medicine

## 2016-03-15 ENCOUNTER — Other Ambulatory Visit: Payer: Self-pay | Admitting: Family Medicine

## 2016-03-15 DIAGNOSIS — I10 Essential (primary) hypertension: Secondary | ICD-10-CM

## 2016-03-27 ENCOUNTER — Other Ambulatory Visit: Payer: Self-pay | Admitting: Family Medicine

## 2016-04-23 HISTORY — PX: COLONOSCOPY: SHX174

## 2016-05-28 ENCOUNTER — Ambulatory Visit: Payer: Federal, State, Local not specified - PPO | Admitting: Family Medicine

## 2016-06-08 ENCOUNTER — Other Ambulatory Visit: Payer: Self-pay | Admitting: Family Medicine

## 2016-06-10 ENCOUNTER — Other Ambulatory Visit: Payer: Self-pay | Admitting: Family Medicine

## 2016-06-11 ENCOUNTER — Ambulatory Visit (INDEPENDENT_AMBULATORY_CARE_PROVIDER_SITE_OTHER): Payer: Federal, State, Local not specified - PPO | Admitting: Family Medicine

## 2016-06-11 ENCOUNTER — Encounter: Payer: Self-pay | Admitting: Family Medicine

## 2016-06-11 VITALS — BP 130/80 | HR 70 | Temp 98.6°F | Wt 197.8 lb

## 2016-06-11 DIAGNOSIS — E782 Mixed hyperlipidemia: Secondary | ICD-10-CM

## 2016-06-11 DIAGNOSIS — E119 Type 2 diabetes mellitus without complications: Secondary | ICD-10-CM

## 2016-06-11 LAB — BASIC METABOLIC PANEL
BUN: 18 mg/dL (ref 6–23)
CHLORIDE: 101 meq/L (ref 96–112)
CO2: 28 mEq/L (ref 19–32)
CREATININE: 0.93 mg/dL (ref 0.40–1.50)
Calcium: 9.7 mg/dL (ref 8.4–10.5)
GFR: 86.09 mL/min (ref >60.00)
Glucose, Bld: 117 mg/dL — ABNORMAL HIGH (ref 70–99)
Potassium: 3.8 mEq/L (ref 3.5–5.1)
Sodium: 136 mEq/L (ref 135–145)

## 2016-06-11 LAB — LIPID PANEL
CHOL/HDL RATIO: 6
Cholesterol: 216 mg/dL — ABNORMAL HIGH (ref 0–200)
HDL: 36.7 mg/dL — ABNORMAL LOW (ref >39.00)
NONHDL: 179.66
TRIGLYCERIDES: 344 mg/dL — AB (ref 0.0–149.0)
VLDL: 68.8 mg/dL — AB (ref 0.0–40.0)

## 2016-06-11 LAB — HEMOGLOBIN A1C: Hgb A1c MFr Bld: 8.1 % — ABNORMAL HIGH (ref 4.6–6.5)

## 2016-06-11 LAB — LDL CHOLESTEROL, DIRECT: Direct LDL: 118 mg/dL

## 2016-06-11 MED ORDER — ONETOUCH ULTRASOFT LANCETS MISC: 12 refills | Status: DC

## 2016-06-11 MED ORDER — GLUCOSE BLOOD VI STRP: ORAL_STRIP | 3 refills | Status: DC

## 2016-06-11 NOTE — Progress Notes (Signed)
Pre visit review using our clinic review tool, if applicable. No additional management support is needed unless otherwise documented below in the visit note. 

## 2016-06-11 NOTE — Progress Notes (Signed)
Diabetes:  Using medications without difficulties:not with current dosing Hypoglycemic episodes:no Hyperglycemic episodes:no Feet problems:no Blood Sugars averaging: 100-130 usually  eye exam within last year: done 11/2016.   Due for labs.   He had been working more on diet.  Eat right diet/low carb diet d/w pt.  Handout given.    Elevated Cholesterol: Using medications without problems:yes Muscle aches: no Diet compliance:yes Exercise: walking a lot at work   PMH and SH reviewed  Meds, vitals, and allergies reviewed.   ROS: Per HPI unless specifically indicated in ROS section   GEN: nad, alert and oriented HEENT: mucous membranes moist NECK: supple w/o LA CV: rrr. PULM: ctab, no inc wob ABD: soft, +bs EXT: no edema SKIN: no acute rash  Diabetic foot exam: Normal inspection No skin breakdown No calluses  Normal DP pulses Normal sensation to light touch and monofilament Nails normal

## 2016-06-11 NOTE — Patient Instructions (Signed)
Go to the lab on the way out.  We'll contact you with your lab report. Don't change your meds for now.  Take care.  Glad to see you.  We'll make plans about f/u when I see your labs.

## 2016-06-12 MED ORDER — METFORMIN HCL 500 MG PO TABS: 500.0000 mg | ORAL_TABLET | Freq: Two times a day (BID) | ORAL | Status: DC

## 2016-06-12 MED ORDER — PRAVASTATIN SODIUM 20 MG PO TABS: 40.0000 mg | ORAL_TABLET | Freq: Every day | ORAL | Status: DC

## 2016-06-12 NOTE — Assessment & Plan Note (Signed)
Sugar level much improved on home check. Due for labs. No change in medication GEN. Will set follow-up after his labs are resulted. Diabetic diet handout given to patient and discussed with all questions answered. Normal foot exam. Okay for outpatient follow-up. >25 minutes spent in face to face time with patient, >50% spent in counselling or coordination of care.

## 2016-06-12 NOTE — Assessment & Plan Note (Signed)
Compliant. No adverse effect on medication. See notes on labs. Continue work on diet and exercise.

## 2016-06-15 ENCOUNTER — Other Ambulatory Visit: Payer: Self-pay | Admitting: Family Medicine

## 2016-06-15 DIAGNOSIS — I10 Essential (primary) hypertension: Secondary | ICD-10-CM

## 2016-08-01 ENCOUNTER — Other Ambulatory Visit: Payer: Self-pay | Admitting: Family Medicine

## 2016-09-05 ENCOUNTER — Other Ambulatory Visit: Payer: Self-pay | Admitting: Family Medicine

## 2016-09-25 ENCOUNTER — Other Ambulatory Visit: Payer: Self-pay | Admitting: Family Medicine

## 2016-10-15 ENCOUNTER — Other Ambulatory Visit: Payer: Federal, State, Local not specified - PPO

## 2016-10-22 ENCOUNTER — Ambulatory Visit: Payer: Federal, State, Local not specified - PPO | Admitting: Family Medicine

## 2016-10-29 ENCOUNTER — Ambulatory Visit: Payer: Federal, State, Local not specified - PPO | Admitting: Family Medicine

## 2016-10-29 ENCOUNTER — Other Ambulatory Visit (INDEPENDENT_AMBULATORY_CARE_PROVIDER_SITE_OTHER): Payer: Federal, State, Local not specified - PPO

## 2016-10-29 DIAGNOSIS — E119 Type 2 diabetes mellitus without complications: Secondary | ICD-10-CM

## 2016-10-29 LAB — LIPID PANEL
Cholesterol: 173 mg/dL (ref 0–200)
HDL: 39.3 mg/dL (ref >39.00)
NonHDL: 134.1
TRIGLYCERIDES: 222 mg/dL — AB (ref 0.0–149.0)
Total CHOL/HDL Ratio: 4
VLDL: 44.4 mg/dL — ABNORMAL HIGH (ref 0.0–40.0)

## 2016-10-29 LAB — HEMOGLOBIN A1C: HEMOGLOBIN A1C: 6.8 % — AB (ref 4.6–6.5)

## 2016-10-29 LAB — LDL CHOLESTEROL, DIRECT: Direct LDL: 106 mg/dL

## 2016-10-29 LAB — HEPATIC FUNCTION PANEL
ALK PHOS: 41 U/L (ref 39–117)
ALT: 24 U/L (ref 0–53)
AST: 29 U/L (ref 0–37)
Albumin: 4 g/dL (ref 3.5–5.2)
BILIRUBIN DIRECT: 0.1 mg/dL (ref 0.0–0.3)
Total Bilirubin: 0.9 mg/dL (ref 0.2–1.2)
Total Protein: 7.4 g/dL (ref 6.0–8.3)

## 2016-11-05 ENCOUNTER — Encounter: Payer: Self-pay | Admitting: Family Medicine

## 2016-11-05 ENCOUNTER — Ambulatory Visit (INDEPENDENT_AMBULATORY_CARE_PROVIDER_SITE_OTHER): Payer: Federal, State, Local not specified - PPO | Admitting: Family Medicine

## 2016-11-05 VITALS — BP 150/88 | HR 73 | Temp 98.3°F | Wt 191.5 lb

## 2016-11-05 DIAGNOSIS — Z23 Encounter for immunization: Secondary | ICD-10-CM | POA: Diagnosis not present

## 2016-11-05 DIAGNOSIS — E782 Mixed hyperlipidemia: Secondary | ICD-10-CM

## 2016-11-05 DIAGNOSIS — Z1211 Encounter for screening for malignant neoplasm of colon: Secondary | ICD-10-CM | POA: Diagnosis not present

## 2016-11-05 DIAGNOSIS — E119 Type 2 diabetes mellitus without complications: Secondary | ICD-10-CM | POA: Diagnosis not present

## 2016-11-05 DIAGNOSIS — I10 Essential (primary) hypertension: Secondary | ICD-10-CM

## 2016-11-05 NOTE — Patient Instructions (Addendum)
Call GI about a follow up colonoscopy.  [phone] [phone].   If you are on 20mg  of pravastatin, then try to increase to 40mg , if tolerated.  Drop back to 20mg  if needed.   If already on 40, then continue as is.   Recheck labs in about 4 months prior to a visit.  Take care.  Glad to see you.  Thanks for your effort.

## 2016-11-05 NOTE — Progress Notes (Signed)
Diabetes:  Using medications without difficulties:yes Hypoglycemic episodes: no Hyperglycemic episodes: no Feet problems:no Blood Sugars averaging: ~100 eye exam within last year: f/u pending for 11/2016.   Intentional weight loss, paying attention to diet.   A1c improved, d/w pt.    Colonoscopy due, d/w pt.  Encouraged him to call.    HTN.  BP elevated at OV.  D/w pt about checking out of clinic and update me if not controlled.  No ADE on med.  Compliant.   HLD.  Unclear if he is on 20 or 40mg  of pravastatin, d/w pt. See AVS.  No ADE on current meds.   Labs d/w pt.    PMH and SH reviewed  Meds, vitals, and allergies reviewed.   ROS: Per HPI unless specifically indicated in ROS section   GEN: nad, alert and oriented HEENT: mucous membranes moist NECK: supple w/o LA CV: rrr. PULM: ctab, no inc wob ABD: soft, +bs EXT: no edema SKIN: no acute rash

## 2016-11-06 ENCOUNTER — Encounter: Payer: Self-pay | Admitting: Family Medicine

## 2016-11-06 DIAGNOSIS — Z1211 Encounter for screening for malignant neoplasm of colon: Secondary | ICD-10-CM | POA: Insufficient documentation

## 2016-11-06 NOTE — Assessment & Plan Note (Signed)
Labs discussed with patient. Statin dose discussed with patient. If he has been taking 40 mg of pravastatin then continue as is. If he is only been taking 20 mg a day try to increase to 40 mg. See as tolerated. See after visit summary. He agrees.

## 2016-11-06 NOTE — Assessment & Plan Note (Signed)
Slightly elevated. No adverse effect on medications. He can check blood pressure out of clinic and if persistently updated and let me know. He agrees.

## 2016-11-06 NOTE — Assessment & Plan Note (Addendum)
A1c improved, discussed with patient. No change in meds. Continue work on diet as he has been doing. Prevnar vaccine done at office visit.

## 2016-11-06 NOTE — Assessment & Plan Note (Signed)
Encouraged follow-up. See after visit summary.

## 2016-11-20 ENCOUNTER — Encounter: Payer: Self-pay | Admitting: Internal Medicine

## 2016-12-14 ENCOUNTER — Other Ambulatory Visit: Payer: Self-pay | Admitting: Family Medicine

## 2016-12-14 DIAGNOSIS — I10 Essential (primary) hypertension: Secondary | ICD-10-CM

## 2017-01-14 ENCOUNTER — Ambulatory Visit (AMBULATORY_SURGERY_CENTER): Payer: Self-pay | Admitting: *Deleted

## 2017-01-14 VITALS — Ht 68.0 in | Wt 197.0 lb

## 2017-01-14 DIAGNOSIS — Z1211 Encounter for screening for malignant neoplasm of colon: Secondary | ICD-10-CM

## 2017-01-14 NOTE — Progress Notes (Signed)
Patient denies any allergies to eggs or soy. Patient denies any problems with anesthesia/sedation. Patient denies any oxygen use at home and does not take any diet/weight loss medications. EMMI education assisgned to patient on colonoscopy, this was explained and instructions given to patient. 

## 2017-01-21 ENCOUNTER — Encounter: Payer: Self-pay | Admitting: Internal Medicine

## 2017-02-02 ENCOUNTER — Ambulatory Visit (INDEPENDENT_AMBULATORY_CARE_PROVIDER_SITE_OTHER): Payer: Federal, State, Local not specified - PPO

## 2017-02-02 DIAGNOSIS — Z23 Encounter for immunization: Secondary | ICD-10-CM

## 2017-02-04 ENCOUNTER — Encounter: Payer: Federal, State, Local not specified - PPO | Admitting: Internal Medicine

## 2017-02-11 ENCOUNTER — Other Ambulatory Visit: Payer: Self-pay | Admitting: Family Medicine

## 2017-02-12 ENCOUNTER — Other Ambulatory Visit: Payer: Self-pay | Admitting: Family Medicine

## 2017-02-12 NOTE — Telephone Encounter (Signed)
Copied from [place] #[id number]. Topic: Quick Communication - See Telephone Encounter >> Feb 12, 2017  9:24 AM Conception Chancy, NT wrote: CRM for notification. See Telephone encounter for:  02/12/17.  Pt needs medication refill for Fibro acid . Has contacted pharmacy and it has been expired.

## 2017-02-12 NOTE — Telephone Encounter (Signed)
I called pt; folic acid refilled on 02/11/17 to [facility]. Pt voiced understanding and will ck with pharmacy for pick up.

## 2017-02-13 ENCOUNTER — Telehealth: Payer: Self-pay

## 2017-02-13 MED ORDER — FENOFIBRIC ACID 135 MG PO CPDR: 1.0000 | DELAYED_RELEASE_CAPSULE | Freq: Every day | ORAL | 3 refills | Status: DC

## 2017-02-13 NOTE — Telephone Encounter (Signed)
Copied from [place] #[id number]. Topic: Quick Communication - See Telephone Encounter >> Feb 13, 2017  2:48 PM Burnis Medin, NT wrote: CRM for notification. See Telephone encounter for:  02/13/17. Pt called in for medication refill Fenofibric Acid Dr 135 mg. Pt. Uses [facility] on [address]

## 2017-03-11 ENCOUNTER — Encounter: Payer: Self-pay | Admitting: Internal Medicine

## 2017-03-11 ENCOUNTER — Other Ambulatory Visit: Payer: Federal, State, Local not specified - PPO

## 2017-03-11 ENCOUNTER — Ambulatory Visit (AMBULATORY_SURGERY_CENTER): Payer: Federal, State, Local not specified - PPO | Admitting: Internal Medicine

## 2017-03-11 ENCOUNTER — Other Ambulatory Visit: Payer: Self-pay

## 2017-03-11 VITALS — BP 153/78 | HR 65 | Temp 96.9°F | Resp 12 | Ht 68.0 in | Wt 197.0 lb

## 2017-03-11 DIAGNOSIS — D125 Benign neoplasm of sigmoid colon: Secondary | ICD-10-CM | POA: Diagnosis not present

## 2017-03-11 DIAGNOSIS — D122 Benign neoplasm of ascending colon: Secondary | ICD-10-CM | POA: Diagnosis not present

## 2017-03-11 DIAGNOSIS — K635 Polyp of colon: Secondary | ICD-10-CM | POA: Diagnosis not present

## 2017-03-11 DIAGNOSIS — Z1211 Encounter for screening for malignant neoplasm of colon: Secondary | ICD-10-CM

## 2017-03-11 DIAGNOSIS — D123 Benign neoplasm of transverse colon: Secondary | ICD-10-CM

## 2017-03-11 DIAGNOSIS — D124 Benign neoplasm of descending colon: Secondary | ICD-10-CM

## 2017-03-11 MED ORDER — SODIUM CHLORIDE 0.9 % IV SOLN: 500.0000 mL | INTRAVENOUS | Status: DC

## 2017-03-11 NOTE — Patient Instructions (Addendum)
I found and removed 5 small polyps that all look benign.  I will let you know pathology results and when to have another routine colonoscopy by mail and/or My Chart.  I appreciate the opportunity to care for you. Gatha Mayer, MD, FACG   YOU HAD AN ENDOSCOPIC PROCEDURE TODAY AT [facility]:   Refer to the procedure report that was given to you for any specific questions about what was found during the examination.  If the procedure report does not answer your questions, please call your gastroenterologist to clarify.  If you requested that your care partner not be given the details of your procedure findings, then the procedure report has been included in a sealed envelope for you to review at your convenience later.  YOU SHOULD EXPECT: Some feelings of bloating in the abdomen. Passage of more gas than usual.  Walking can help get rid of the air that was put into your GI tract during the procedure and reduce the bloating. If you had a lower endoscopy (such as a colonoscopy or flexible sigmoidoscopy) you may notice spotting of blood in your stool or on the toilet paper. If you underwent a bowel prep for your procedure, you may not have a normal bowel movement for a few days.  Please Note:  You might notice some irritation and congestion in your nose or some drainage.  This is from the oxygen used during your procedure.  There is no need for concern and it should clear up in a day or so.  SYMPTOMS TO REPORT IMMEDIATELY:   Following lower endoscopy (colonoscopy or flexible sigmoidoscopy):  Excessive amounts of blood in the stool  Significant tenderness or worsening of abdominal pains  Swelling of the abdomen that is new, acute  Fever of 100F or higher   For urgent or emergent issues, a gastroenterologist can be reached at any hour by calling [phone].   DIET:  We do recommend a small meal at first, but then you may proceed to your regular diet.  Drink  plenty of fluids but you should avoid alcoholic beverages for 24 hours.  ACTIVITY:  You should plan to take it easy for the rest of today and you should NOT DRIVE or use heavy machinery until tomorrow (because of the sedation medicines used during the test).    FOLLOW UP: Our staff will call the number listed on your records the next business day following your procedure to check on you and address any questions or concerns that you may have regarding the information given to you following your procedure. If we do not reach you, we will leave a message.  However, if you are feeling well and you are not experiencing any problems, there is no need to return our call.  We will assume that you have returned to your regular daily activities without incident.  If any biopsies were taken you will be contacted by phone or by letter within the next 1-3 weeks.  Please call us at [phone] if you have not heard about the biopsies in 3 weeks.    SIGNATURES/CONFIDENTIALITY: You and/or your care partner have signed paperwork which will be entered into your electronic medical record.  These signatures attest to the fact that that the information above on your After Visit Summary has been reviewed and is understood.  Full responsibility of the confidentiality of this discharge information lies with you and/or your care-partner.  Read all of the handouts given to  you by your recovery room nurse.

## 2017-03-11 NOTE — Progress Notes (Signed)
Report to PACU, RN, vss, BBS= Clear.  

## 2017-03-11 NOTE — Progress Notes (Signed)
Called to room to assist during endoscopic procedure.  Patient ID and intended procedure confirmed with present staff. Received instructions for my participation in the procedure from the performing physician.  

## 2017-03-11 NOTE — Op Note (Signed)
[facility] Patient Name: [name] Procedure Date: 03/11/2017 12:07 PM MRN: [id number] Endoscopist: Gatha Mayer , MD Age: 66 Referring MD:  Date of Birth: 01-27-1951 Gender: Male Account #: [id number] Procedure:                Colonoscopy Indications:              Screening for colorectal malignant neoplasm, Last                            colonoscopy: 2006 Medicines:                Monitored Anesthesia Care Procedure:                Pre-Anesthesia Assessment:                           - Prior to the procedure, a History and Physical                            was performed, and patient medications and                            allergies were reviewed. The patient's tolerance of                            previous anesthesia was also reviewed. The risks                            and benefits of the procedure and the sedation                            options and risks were discussed with the patient.                            All questions were answered, and informed consent                            was obtained. Prior Anticoagulants: The patient has                            taken no previous anticoagulant or antiplatelet                            agents. ASA Grade Assessment: II - A patient with                            mild systemic disease. After reviewing the risks                            and benefits, the patient was deemed in                            satisfactory condition to undergo the procedure.  After obtaining informed consent, the colonoscope                            was passed under direct vision. Throughout the                            procedure, the patient's blood pressure, pulse, and                            oxygen saturations were monitored continuously. The                            Colonoscope was introduced through the anus and                            advanced to the the cecum, identified by                             appendiceal orifice and ileocecal valve. The                            colonoscopy was performed without difficulty. The                            patient tolerated the procedure well. The quality                            of the bowel preparation was excellent. The                            ileocecal valve, appendiceal orifice, and rectum                            were photographed. The bowel preparation used was                            Miralax. Scope In: 12:10:05 PM Scope Out: 12:27:30 PM Scope Withdrawal Time: 0 hours 14 minutes 35 seconds  Total Procedure Duration: 0 hours 17 minutes 25 seconds  Findings:                 The perianal and digital rectal examinations were                            normal. Pertinent negatives include normal prostate                            (size, shape, and consistency).                           Three sessile polyps were found in the transverse                            colon and ascending colon. The polyps were  diminutive in size. These polyps were removed with                            a cold biopsy forceps. Resection and retrieval were                            complete. Verification of patient identification                            for the specimen was done. Estimated blood loss was                            minimal.                           Two sessile polyps were found in the sigmoid colon                            and descending colon. The polyps were diminutive in                            size. These polyps were removed with a cold snare.                            Resection and retrieval were complete. Verification                            of patient identification for the specimen was                            done. Estimated blood loss was minimal.                           The exam was otherwise without abnormality on                            direct and  retroflexion views. Complications:            No immediate complications. Estimated Blood Loss:     Estimated blood loss was minimal. Impression:               - Three diminutive polyps in the transverse colon                            and in the ascending colon, removed with a cold                            biopsy forceps. Resected and retrieved.                           - Two diminutive polyps in the sigmoid colon and in                            the descending colon, removed with a cold snare.  Resected and retrieved.                           - The examination was otherwise normal on direct                            and retroflexion views. Recommendation:           - Patient has a contact number available for                            emergencies. The signs and symptoms of potential                            delayed complications were discussed with the                            patient. Return to normal activities tomorrow.                            Written discharge instructions were provided to the                            patient.                           - Resume previous diet.                           - Continue present medications.                           - Repeat colonoscopy is recommended. The                            colonoscopy date will be determined after pathology                            results from today's exam become available for                            review. Gatha Mayer, MD 03/11/2017 12:32:25 PM This report has been signed electronically.

## 2017-03-11 NOTE — Progress Notes (Signed)
Pt's states no medical or surgical changes since previsit or office visit. 

## 2017-03-12 ENCOUNTER — Telehealth: Payer: Self-pay | Admitting: *Deleted

## 2017-03-12 NOTE — Telephone Encounter (Signed)
  Follow up Call-  Call back number 03/11/2017  Post procedure Call Back phone  # [phone]  Permission to leave phone message Yes  Some recent data might be hidden     Patient questions:  Do you have a fever, pain , or abdominal swelling? No. Pain Score  0 *  Have you tolerated food without any problems? Yes.    Have you been able to return to your normal activities? Yes.    Do you have any questions about your discharge instructions: Diet   No. Medications  No. Follow up visit  No.  Do you have questions or concerns about your Care? No.  Actions: * If pain score is 4 or above: No action needed, pain <4.

## 2017-03-13 ENCOUNTER — Other Ambulatory Visit: Payer: Self-pay | Admitting: Family Medicine

## 2017-03-18 ENCOUNTER — Other Ambulatory Visit: Payer: Federal, State, Local not specified - PPO

## 2017-03-21 ENCOUNTER — Encounter: Payer: Self-pay | Admitting: Internal Medicine

## 2017-03-21 DIAGNOSIS — Z860101 Personal history of adenomatous and serrated colon polyps: Secondary | ICD-10-CM | POA: Insufficient documentation

## 2017-03-21 DIAGNOSIS — Z86010 Personal history of colonic polyps: Secondary | ICD-10-CM

## 2017-03-21 HISTORY — DX: Personal history of adenomatous and serrated colon polyps: Z86.0101

## 2017-03-21 HISTORY — DX: Personal history of colonic polyps: Z86.010

## 2017-03-21 NOTE — Progress Notes (Signed)
5 diminutive polyps tub adenomas/ssp Recall 2021 My Chart letter

## 2017-03-22 ENCOUNTER — Other Ambulatory Visit: Payer: Self-pay | Admitting: Family Medicine

## 2017-04-01 ENCOUNTER — Other Ambulatory Visit: Payer: Federal, State, Local not specified - PPO

## 2017-04-29 ENCOUNTER — Other Ambulatory Visit: Payer: Federal, State, Local not specified - PPO

## 2017-05-13 ENCOUNTER — Other Ambulatory Visit: Payer: Self-pay | Admitting: Family Medicine

## 2017-05-13 ENCOUNTER — Other Ambulatory Visit: Payer: Federal, State, Local not specified - PPO

## 2017-05-13 DIAGNOSIS — E119 Type 2 diabetes mellitus without complications: Secondary | ICD-10-CM

## 2017-05-27 ENCOUNTER — Other Ambulatory Visit (INDEPENDENT_AMBULATORY_CARE_PROVIDER_SITE_OTHER): Payer: Federal, State, Local not specified - PPO

## 2017-05-27 DIAGNOSIS — E119 Type 2 diabetes mellitus without complications: Secondary | ICD-10-CM

## 2017-05-27 LAB — HEMOGLOBIN A1C: Hgb A1c MFr Bld: 7 % — ABNORMAL HIGH (ref 4.6–6.5)

## 2017-05-28 ENCOUNTER — Telehealth: Payer: Self-pay | Admitting: Family Medicine

## 2017-05-28 NOTE — Telephone Encounter (Signed)
Pt given results per notes of Dr. Damita Dunnings on 05/28/17. Unable to document in result note due to result note not being routed to [facility]. Pt stated that he will call back to make OV appt.

## 2017-06-06 ENCOUNTER — Other Ambulatory Visit: Payer: Self-pay | Admitting: Family Medicine

## 2017-06-11 ENCOUNTER — Other Ambulatory Visit: Payer: Self-pay | Admitting: Family Medicine

## 2017-06-11 DIAGNOSIS — I10 Essential (primary) hypertension: Secondary | ICD-10-CM

## 2017-08-02 ENCOUNTER — Other Ambulatory Visit: Payer: Self-pay | Admitting: Family Medicine

## 2017-09-23 ENCOUNTER — Other Ambulatory Visit: Payer: Self-pay | Admitting: Family Medicine

## 2017-10-28 ENCOUNTER — Other Ambulatory Visit: Payer: Self-pay | Admitting: Family Medicine

## 2017-10-28 NOTE — Telephone Encounter (Signed)
Ok to refill? Electronically refill request for   Last prescribed on 09/23/2017  Last office vist on 11/05/2016

## 2017-10-29 NOTE — Telephone Encounter (Signed)
Please schedule appointment as instructed. 

## 2017-10-29 NOTE — Telephone Encounter (Signed)
Left message for patient to call back to schedule-[name], RMA

## 2017-10-29 NOTE — Telephone Encounter (Signed)
Sent.  Needs DM2 f/u.

## 2017-10-29 NOTE — Telephone Encounter (Signed)
Appointment made by [facility] on 11/05/17-Kamali Sakata Estell Harpin, RMA

## 2017-11-05 ENCOUNTER — Encounter: Payer: Self-pay | Admitting: Family Medicine

## 2017-11-05 ENCOUNTER — Ambulatory Visit: Payer: Federal, State, Local not specified - PPO | Admitting: Family Medicine

## 2017-11-05 VITALS — BP 156/86 | HR 72 | Temp 98.5°F | Ht 68.0 in | Wt 200.5 lb

## 2017-11-05 DIAGNOSIS — E119 Type 2 diabetes mellitus without complications: Secondary | ICD-10-CM

## 2017-11-05 DIAGNOSIS — I10 Essential (primary) hypertension: Secondary | ICD-10-CM | POA: Diagnosis not present

## 2017-11-05 DIAGNOSIS — Z794 Long term (current) use of insulin: Secondary | ICD-10-CM | POA: Diagnosis not present

## 2017-11-05 LAB — POCT GLYCOSYLATED HEMOGLOBIN (HGB A1C): Hemoglobin A1C: 7.9 % — AB (ref 4.0–5.6)

## 2017-11-05 MED ORDER — METFORMIN HCL 500 MG PO TABS: ORAL_TABLET | ORAL | 1 refills | Status: DC

## 2017-11-05 MED ORDER — BENAZEPRIL HCL 20 MG PO TABS: 20.0000 mg | ORAL_TABLET | Freq: Every day | ORAL | 1 refills | Status: DC

## 2017-11-05 MED ORDER — METOPROLOL TARTRATE 50 MG PO TABS: 50.0000 mg | ORAL_TABLET | Freq: Two times a day (BID) | ORAL | 1 refills | Status: DC

## 2017-11-05 NOTE — Patient Instructions (Signed)
Ask about getting an annual medicare visit set up with [facility] with a visit with Damita Dunnings a few days later- to be done in about 4 months.   Don't change your meds for now.  We'll go from there.  Thanks for your effort.  Take care.  Glad to see you.

## 2017-11-05 NOTE — Progress Notes (Signed)
I was delayed in seeing him initially this morning at the office visit.  This was due to patient care involving another patient.  I apologized to the patient and he was really gracious about it.  He has been helping extended family down in [place] . The situation is better now.  D/w pt. he deserves a lot of credit for being willing to go help his extended family, discussed.  Diabetes:  Using medications without difficulties: yes Hypoglycemic episodes:no Hyperglycemic episodes:no Feet problems: no Blood Sugars averaging:  120-130s.   eye exam within last year:pending for 12/04/17  Meds, vitals, and allergies reviewed.   ROS: Per HPI unless specifically indicated in ROS section   GEN: nad, alert and oriented HEENT: mucous membranes moist NECK: supple w/o LA CV: rrr. PULM: ctab, no inc wob ABD: soft, +bs EXT: no edema SKIN: no acute rash  Diabetic foot exam: Normal inspection No skin breakdown No calluses  Normal DP pulses Normal sensation to light touch and monofilament Nails normal

## 2017-11-06 NOTE — Assessment & Plan Note (Signed)
Discussed with patient about options.  His A1c is up but he had significant change in his diet and exercise and routine while he was caring for family in [place].  He is back home now and can get back on a more routine schedule.  This should help him with control of his sugar.  He agrees.  No change in meds at this point.  All questions answered.  See after visit summary.  Recheck later this year.  He agrees.

## 2017-12-17 LAB — HM DIABETES EYE EXAM

## 2017-12-24 ENCOUNTER — Encounter: Payer: Self-pay | Admitting: Optometry

## 2018-01-17 ENCOUNTER — Telehealth: Payer: Self-pay

## 2018-01-17 NOTE — Telephone Encounter (Signed)
Jonelle Sidle Medical laboratory scientific officer) from [place] called to request an order to administer this patient's Shingrix vaccination.   He was in with his wife (as she is their patient) and requested that since our office didn't carry if they could give it to him.  Since this is Dr. Josefine Class patient, she wanted the okay before scheduling his 2 series vaccination.  They are happy to set this up for him if we approve.    Dr. Damita Dunnings, if in agreement, just forward response to Su Hilt, RN.  I will copy her on this message string.  Thanks.

## 2018-01-19 NOTE — Telephone Encounter (Signed)
Agreed.  Please proceed.  Thanks.  Message routed.

## 2018-01-20 NOTE — Telephone Encounter (Signed)
Patient advised of dr Josefine Class instructions, he will call back to schedule nurse visit for 1st and 2nd shingrix vaccine

## 2018-01-26 ENCOUNTER — Other Ambulatory Visit: Payer: Self-pay | Admitting: Family Medicine

## 2018-02-01 ENCOUNTER — Other Ambulatory Visit: Payer: Self-pay | Admitting: Family Medicine

## 2018-02-06 ENCOUNTER — Ambulatory Visit: Payer: Federal, State, Local not specified - PPO

## 2018-02-07 NOTE — Telephone Encounter (Signed)
Mandy RN said to send back to [facility] to schedule this appt at [place] visit. We cannot schedule at another [facility] site for nurse visit. At this point pt thinks has nurse visit at [facility] appt on 02/13/18 at 3:30 for 1st shingrix. Will send skype to Celedonio Savage also.

## 2018-02-13 ENCOUNTER — Ambulatory Visit: Payer: Federal, State, Local not specified - PPO

## 2018-02-13 ENCOUNTER — Ambulatory Visit (INDEPENDENT_AMBULATORY_CARE_PROVIDER_SITE_OTHER): Payer: Federal, State, Local not specified - PPO

## 2018-02-13 DIAGNOSIS — Z23 Encounter for immunization: Secondary | ICD-10-CM

## 2018-03-02 ENCOUNTER — Other Ambulatory Visit: Payer: Self-pay | Admitting: Family Medicine

## 2018-03-02 DIAGNOSIS — Z794 Long term (current) use of insulin: Principal | ICD-10-CM

## 2018-03-02 DIAGNOSIS — E119 Type 2 diabetes mellitus without complications: Secondary | ICD-10-CM

## 2018-03-04 ENCOUNTER — Other Ambulatory Visit (INDEPENDENT_AMBULATORY_CARE_PROVIDER_SITE_OTHER): Payer: Federal, State, Local not specified - PPO

## 2018-03-04 DIAGNOSIS — Z794 Long term (current) use of insulin: Secondary | ICD-10-CM | POA: Diagnosis not present

## 2018-03-04 DIAGNOSIS — E119 Type 2 diabetes mellitus without complications: Secondary | ICD-10-CM | POA: Diagnosis not present

## 2018-03-04 LAB — COMPREHENSIVE METABOLIC PANEL
ALK PHOS: 44 U/L (ref 39–117)
ALT: 39 U/L (ref 0–53)
AST: 33 U/L (ref 0–37)
Albumin: 4.1 g/dL (ref 3.5–5.2)
BILIRUBIN TOTAL: 0.8 mg/dL (ref 0.2–1.2)
BUN: 17 mg/dL (ref 6–23)
CALCIUM: 9.7 mg/dL (ref 8.4–10.5)
CO2: 27 meq/L (ref 19–32)
Chloride: 102 mEq/L (ref 96–112)
Creatinine, Ser: 0.99 mg/dL (ref 0.40–1.50)
GFR: 79.69 mL/min (ref >60.00)
Glucose, Bld: 239 mg/dL — ABNORMAL HIGH (ref 70–99)
Potassium: 4.1 mEq/L (ref 3.5–5.1)
Sodium: 137 mEq/L (ref 135–145)
TOTAL PROTEIN: 7.6 g/dL (ref 6.0–8.3)

## 2018-03-04 LAB — LDL CHOLESTEROL, DIRECT: Direct LDL: 140 mg/dL

## 2018-03-04 LAB — LIPID PANEL
CHOLESTEROL: 204 mg/dL — AB (ref 0–200)
HDL: 35.5 mg/dL — AB (ref >39.00)
NonHDL: 168.39
TRIGLYCERIDES: 263 mg/dL — AB (ref 0.0–149.0)
Total CHOL/HDL Ratio: 6
VLDL: 52.6 mg/dL — ABNORMAL HIGH (ref 0.0–40.0)

## 2018-03-04 LAB — HEMOGLOBIN A1C: Hgb A1c MFr Bld: 9.6 % — ABNORMAL HIGH (ref 4.6–6.5)

## 2018-03-11 ENCOUNTER — Encounter: Payer: Self-pay | Admitting: Family Medicine

## 2018-03-11 ENCOUNTER — Ambulatory Visit (INDEPENDENT_AMBULATORY_CARE_PROVIDER_SITE_OTHER): Payer: Federal, State, Local not specified - PPO | Admitting: Family Medicine

## 2018-03-11 ENCOUNTER — Encounter (INDEPENDENT_AMBULATORY_CARE_PROVIDER_SITE_OTHER): Payer: Self-pay

## 2018-03-11 VITALS — BP 132/82 | HR 75 | Temp 98.5°F | Ht 68.0 in | Wt 200.5 lb

## 2018-03-11 DIAGNOSIS — E119 Type 2 diabetes mellitus without complications: Secondary | ICD-10-CM

## 2018-03-11 DIAGNOSIS — Z23 Encounter for immunization: Secondary | ICD-10-CM | POA: Diagnosis not present

## 2018-03-11 DIAGNOSIS — I10 Essential (primary) hypertension: Secondary | ICD-10-CM

## 2018-03-11 DIAGNOSIS — Z0000 Encounter for general adult medical examination without abnormal findings: Secondary | ICD-10-CM | POA: Diagnosis not present

## 2018-03-11 DIAGNOSIS — E782 Mixed hyperlipidemia: Secondary | ICD-10-CM

## 2018-03-11 DIAGNOSIS — Z7189 Other specified counseling: Secondary | ICD-10-CM

## 2018-03-11 MED ORDER — METOPROLOL TARTRATE 50 MG PO TABS: 50.0000 mg | ORAL_TABLET | Freq: Two times a day (BID) | ORAL | 3 refills | Status: DC

## 2018-03-11 MED ORDER — FENOFIBRIC ACID 135 MG PO CPDR: 1.0000 | DELAYED_RELEASE_CAPSULE | Freq: Every day | ORAL | 3 refills | Status: DC

## 2018-03-11 MED ORDER — PRAVASTATIN SODIUM 20 MG PO TABS: 20.0000 mg | ORAL_TABLET | Freq: Every day | ORAL | 3 refills | Status: DC

## 2018-03-11 MED ORDER — METFORMIN HCL 500 MG PO TABS: ORAL_TABLET | ORAL | 3 refills | Status: DC

## 2018-03-11 MED ORDER — FOLIC ACID 1 MG PO TABS: 1.0000 mg | ORAL_TABLET | Freq: Every day | ORAL | 3 refills | Status: DC

## 2018-03-11 MED ORDER — BENAZEPRIL HCL 20 MG PO TABS: 20.0000 mg | ORAL_TABLET | Freq: Every day | ORAL | 3 refills | Status: DC

## 2018-03-11 NOTE — Patient Instructions (Addendum)
Check to see if the tetanus shot is cheaper at the pharmacy.    The only lab you need to have done for your next diabetic visit is an A1c.  We can do this with a fingerstick test at the office visit.  You do not need a lab visit ahead of time for this.  It does not matter if you are fasting when the lab is done.   Recheck in about 3-4 months.    Let me know about your sugar before and 2 hours after meals in the meantime.  Fill out the grid with your readings.   Flu shot today.    Take care.  Glad to see you.

## 2018-03-11 NOTE — Progress Notes (Signed)
CPE- See plan.  Routine anticipatory guidance given to patient.  See health maintenance.  The possibility exists that previously documented standard health maintenance information may have been brought forward from a previous encounter into this note.  If needed, that same information has been updated to reflect the current situation based on today's encounter.    Tetanus 2009 PNA UTD Shingles shot 1st dose done.   Flu shot 2019 Colonoscopy 2018 Prostate cancer screening and PSA options (with potential risks and benefits of testing vs not testing) were discussed along with recent recs/guidelines.  He declined testing PSA at this point. Diet and exercise d/w pt. Exercise- walking at work. Diet is good.   Living will d/w pt. Wife designated if patient were incapacitated.   Hypertension:    Using medication without problems or lightheadedness: yes Chest pain with exertion:no Edema:no Short of breath:no  Elevated Cholesterol: Using medications without problems:yes Muscle aches: no Diet compliance: yes Exercise: yes  Diabetes:  Using medications without difficulties: yes Hypoglycemic episodes:no Hyperglycemic episodes:no Feet problems:no Blood Sugars averaging: ~120-130 fasting on home checks.   eye exam within last year: yes  Declined hearing aids.  Has been to eye clinic.  No falls.   Memory w/o lapses.  Still working.  3/3 recall.  Can do math, A&Ox3.    PMH and SH reviewed  Meds, vitals, and allergies reviewed.   ROS: Per HPI.  Unless specifically indicated otherwise in HPI, the patient denies:  General: fever. Eyes: acute vision changes ENT: sore throat Cardiovascular: chest pain Respiratory: SOB GI: vomiting GU: dysuria Musculoskeletal: acute back pain Derm: acute rash Neuro: acute motor dysfunction Psych: worsening mood Endocrine: polydipsia Heme: bleeding Allergy: hayfever  GEN: nad, alert and oriented HEENT: mucous membranes moist NECK: supple w/o  LA CV: rrr. PULM: ctab, no inc wob ABD: soft, +bs EXT: no edema SKIN: no acute rash  Diabetic foot exam: Normal inspection No skin breakdown No calluses  Normal DP pulses Normal sensation to light touch and monofilament Nails normal

## 2018-03-12 NOTE — Assessment & Plan Note (Signed)
Living will d/w pt.  Wife designated if patient were incapacitated.   ?

## 2018-03-12 NOTE — Assessment & Plan Note (Signed)
Triglyceride elevation noted.  Needs help on diabetes as that is likely affecting his lipids.  Discussed.Marland Kitchen  No change in meds.  Continue work on diet and exercise.  He agrees.

## 2018-03-12 NOTE — Assessment & Plan Note (Signed)
A1c higher than expected based on his reported home readings.  Discussed with patient.  No change in meds at this point.  I want him to start checking his blood sugar before and after meals.  I gave him agreed to fill out.  He will update me with his sugars.  We can make adjustments at that point.  He will update me in the relatively near future.  See after visit summary.

## 2018-03-12 NOTE — Assessment & Plan Note (Signed)
Tetanus 2009 PNA UTD Shingles shot 1st dose done.   Flu shot 2019 Colonoscopy 2018 Prostate cancer screening and PSA options (with potential risks and benefits of testing vs not testing) were discussed along with recent recs/guidelines.  He declined testing PSA at this point. Diet and exercise d/w pt. Exercise- walking at work. Diet is good.   Living will d/w pt. Wife designated if patient were incapacitated.

## 2018-03-12 NOTE — Assessment & Plan Note (Signed)
Reasonable control.  No change in meds.  Continue work on diet and exercise.  He agrees.

## 2018-04-24 ENCOUNTER — Ambulatory Visit (INDEPENDENT_AMBULATORY_CARE_PROVIDER_SITE_OTHER): Payer: Federal, State, Local not specified - PPO | Admitting: *Deleted

## 2018-04-24 DIAGNOSIS — Z23 Encounter for immunization: Secondary | ICD-10-CM

## 2018-06-12 ENCOUNTER — Other Ambulatory Visit: Payer: Self-pay | Admitting: Family Medicine

## 2018-07-01 ENCOUNTER — Ambulatory Visit: Payer: Federal, State, Local not specified - PPO | Admitting: Family Medicine

## 2018-07-01 ENCOUNTER — Encounter: Payer: Self-pay | Admitting: Family Medicine

## 2018-07-01 VITALS — BP 150/76 | HR 66 | Temp 98.0°F | Ht 68.0 in | Wt 194.4 lb

## 2018-07-01 DIAGNOSIS — E119 Type 2 diabetes mellitus without complications: Secondary | ICD-10-CM

## 2018-07-01 LAB — POCT GLYCOSYLATED HEMOGLOBIN (HGB A1C): Hemoglobin A1C: 8.5 % — AB (ref 4.0–5.6)

## 2018-07-01 NOTE — Progress Notes (Signed)
Diabetes:  Using medications without difficulties: yes Hypoglycemic episodes: no Hyperglycemic episodes: no Feet problems: no Blood Sugars averaging: ~130s eye exam within last year: yes A1c down to 8.5.   He is working on diet and exercise.  D/w pt.   He feels better in general.    Meds, vitals, and allergies reviewed.   ROS: Per HPI unless specifically indicated in ROS section   GEN: nad, alert and oriented HEENT: mucous membranes moist NECK: supple w/o LA CV: rrr. PULM: ctab, no inc wob ABD: soft, +bs EXT: no edema SKIN: no acute rash

## 2018-07-01 NOTE — Patient Instructions (Signed)
Thank you for your effort.  Don't change your meds for now.  Take care.  Glad to see you.  Update me as needed.  If you have low sugars, then cut the metformin back to 1 pill a day.   Recheck in about 3 months with A1c at the visit.

## 2018-07-02 NOTE — Assessment & Plan Note (Signed)
A1c down to 8.5.   Not at goal but better. He is working on diet and exercise.  D/w pt.   He feels better in general.   No change in meds for now. Update me as needed.  If having low sugars, then cut the metformin back to 1 pill a day.   Recheck in about 3 months with A1c at the visit.  He agrees with plan.  His A1c may continue to improve with diet and exercise without additional medication.  I want to avoid hypoglycemia.

## 2018-10-07 ENCOUNTER — Ambulatory Visit: Payer: Federal, State, Local not specified - PPO | Admitting: Family Medicine

## 2018-10-07 ENCOUNTER — Other Ambulatory Visit: Payer: Self-pay

## 2018-10-07 ENCOUNTER — Encounter: Payer: Self-pay | Admitting: Family Medicine

## 2018-10-07 VITALS — BP 142/80 | HR 65 | Temp 98.4°F | Ht 68.0 in | Wt 192.5 lb

## 2018-10-07 DIAGNOSIS — E119 Type 2 diabetes mellitus without complications: Secondary | ICD-10-CM

## 2018-10-07 LAB — POCT GLYCOSYLATED HEMOGLOBIN (HGB A1C): Hemoglobin A1C: 8.5 % — AB (ref 4.0–5.6)

## 2018-10-07 NOTE — Patient Instructions (Addendum)
Recheck in about 4 months.  A1c at the visit.   It may be reasonable to try checking your sugar before and then 2 hours after a meal, just to see how much elevation you have.  If your sugars are progressively higher, then let me know.   Take care.  Glad to see you.

## 2018-10-07 NOTE — Progress Notes (Signed)
Diabetes:  Using medications without difficulties: yes Hypoglycemic episodes: no Hyperglycemic episodes: no Feet problems: no Blood Sugars averaging: ~130s eye exam within last year: yes Taking metformin 500mg  BID.   A1c similar to prev at 8.5.   He hopes to get more exercise in the next few months.    Pandemic considerations d/w pt.  He was at home initially but has been back at part time work for the last few weeks. He is masking etc.    Meds, vitals, and allergies reviewed.   ROS: Per HPI unless specifically indicated in ROS section   GEN: nad, alert and oriented HEENT: ncat NECK: supple w/o LA CV: rrr. PULM: ctab, no inc wob ABD: soft, +bs EXT: no edema SKIN: well perfused.

## 2018-10-08 NOTE — Assessment & Plan Note (Signed)
Taking metformin 500mg  BID.   A1c similar to prev at 8.5.   He hopes to get more exercise in the next few months.   Advised the following-  Recheck in about 4 months.  A1c at the visit.   It may be reasonable to try checking your sugar before and then 2 hours after a meal, just to see how much elevation you have.  If your sugars are progressively higher, then let me know.

## 2019-01-05 ENCOUNTER — Other Ambulatory Visit: Payer: Self-pay

## 2019-01-05 ENCOUNTER — Ambulatory Visit (INDEPENDENT_AMBULATORY_CARE_PROVIDER_SITE_OTHER): Payer: Federal, State, Local not specified - PPO

## 2019-01-05 DIAGNOSIS — Z23 Encounter for immunization: Secondary | ICD-10-CM | POA: Diagnosis not present

## 2019-01-20 LAB — HM DIABETES EYE EXAM

## 2019-02-10 ENCOUNTER — Other Ambulatory Visit: Payer: Self-pay

## 2019-02-10 ENCOUNTER — Ambulatory Visit: Payer: Federal, State, Local not specified - PPO | Admitting: Family Medicine

## 2019-02-10 ENCOUNTER — Encounter: Payer: Self-pay | Admitting: Family Medicine

## 2019-02-10 VITALS — BP 136/74 | HR 84 | Temp 97.3°F | Ht 68.0 in | Wt 188.1 lb

## 2019-02-10 DIAGNOSIS — Z23 Encounter for immunization: Secondary | ICD-10-CM | POA: Diagnosis not present

## 2019-02-10 DIAGNOSIS — E119 Type 2 diabetes mellitus without complications: Secondary | ICD-10-CM

## 2019-02-10 LAB — POCT GLYCOSYLATED HEMOGLOBIN (HGB A1C): Hemoglobin A1C: 8.9 % — AB (ref 4.0–5.6)

## 2019-02-10 MED ORDER — METFORMIN HCL 500 MG PO TABS: ORAL_TABLET | ORAL | 3 refills | Status: DC

## 2019-02-10 NOTE — Patient Instructions (Addendum)
Thanks for your effort.  Try taking 2 metformin in the AM and 1 at night.  Then try 2 and 2 if tolerated.  PNA 23 shot today.  Thanks for getting a flu shot.  Keep working on diet and exercise.  Recheck in about 4 months with labs ahead of a physical.  Take care.  Glad to see you.

## 2019-02-10 NOTE — Progress Notes (Signed)
Diabetes:  Using medications without difficulties: yes Hypoglycemic episodes:no Hyperglycemic episodes:  no Feet problems:no Blood Sugars averaging: ~130s eye exam within last year: done 12/2018.  Done by Dr. Posey Pronto at Dr. Rachael Fee office.  Intentional weight loss with diet and exercise.   A1c done at OV.    Meds, vitals, and allergies reviewed.  ROS: Per HPI unless specifically indicated in ROS section   GEN: nad, alert and oriented HEENT: ncat NECK: supple w/o LA CV: rrr. PULM: ctab, no inc wob ABD: soft, +bs EXT: no edema SKIN:  Well perfused.   Diabetic foot exam: Normal inspection No skin breakdown No calluses  Normal DP pulses Normal sensation to light touch and monofilament Nails normal

## 2019-02-11 NOTE — Assessment & Plan Note (Signed)
Intentional weight loss with diet and exercise.   A1c done at OV.   Discussed increasing Metformin.  Try taking 2 metformin in the AM and 1 at night.  Then try 2 and 2 if tolerated.  PNA 23 shot today.  Keep working on diet and exercise.  Recheck in about 4 months with labs ahead of a physical.  He agrees to plan.

## 2019-02-26 ENCOUNTER — Encounter: Payer: Self-pay | Admitting: Optometry

## 2019-03-08 ENCOUNTER — Other Ambulatory Visit: Payer: Self-pay | Admitting: Family Medicine

## 2019-04-09 ENCOUNTER — Other Ambulatory Visit: Payer: Self-pay | Admitting: Family Medicine

## 2019-04-13 ENCOUNTER — Other Ambulatory Visit: Payer: Self-pay | Admitting: Family Medicine

## 2019-04-13 DIAGNOSIS — I10 Essential (primary) hypertension: Secondary | ICD-10-CM

## 2019-04-14 ENCOUNTER — Other Ambulatory Visit: Payer: Self-pay | Admitting: Family Medicine

## 2019-04-23 ENCOUNTER — Other Ambulatory Visit: Payer: Self-pay | Admitting: Family Medicine

## 2019-05-01 ENCOUNTER — Other Ambulatory Visit: Payer: Self-pay | Admitting: Family Medicine

## 2019-06-04 ENCOUNTER — Other Ambulatory Visit: Payer: Self-pay | Admitting: Family Medicine

## 2019-06-04 DIAGNOSIS — E119 Type 2 diabetes mellitus without complications: Secondary | ICD-10-CM

## 2019-06-09 ENCOUNTER — Other Ambulatory Visit: Payer: Federal, State, Local not specified - PPO

## 2019-06-13 ENCOUNTER — Ambulatory Visit: Payer: Federal, State, Local not specified - PPO | Attending: Internal Medicine

## 2019-06-13 DIAGNOSIS — Z23 Encounter for immunization: Secondary | ICD-10-CM

## 2019-06-13 NOTE — Progress Notes (Signed)
   Covid-19 Vaccination Clinic  Name:  RADU MISIASZEK    MRN: [id number] DOB: [birth year]  06/13/2019  Mr. Maritato was observed post Covid-19 immunization for 15 minutes without incidence. He was provided with Vaccine Information Sheet and instruction to access the V-Safe system.   Mr. Keniston was instructed to call 911 with any severe reactions post vaccine: Marland Kitchen Difficulty breathing  . Swelling of your face and throat  . A fast heartbeat  . A bad rash all over your body  . Dizziness and weakness    Immunizations Administered    Name Date Dose VIS Date Route   Pfizer COVID-19 Vaccine 06/13/2019 11:03 AM 0.3 mL 04/03/2019 Intramuscular   Manufacturer: [place]   Lot: [id number]   [place]: [id number]

## 2019-06-15 ENCOUNTER — Other Ambulatory Visit: Payer: Federal, State, Local not specified - PPO

## 2019-06-15 ENCOUNTER — Encounter: Payer: Federal, State, Local not specified - PPO | Admitting: Family Medicine

## 2019-06-22 ENCOUNTER — Encounter: Payer: Federal, State, Local not specified - PPO | Admitting: Family Medicine

## 2019-06-23 ENCOUNTER — Telehealth: Payer: Self-pay

## 2019-06-23 NOTE — Telephone Encounter (Signed)
LVM to call clinic, needs COVID screen, front door and back lab info 3.2.2021 TLJ

## 2019-06-25 ENCOUNTER — Other Ambulatory Visit (INDEPENDENT_AMBULATORY_CARE_PROVIDER_SITE_OTHER): Payer: Federal, State, Local not specified - PPO

## 2019-06-25 ENCOUNTER — Encounter: Payer: Federal, State, Local not specified - PPO | Admitting: Family Medicine

## 2019-06-25 ENCOUNTER — Other Ambulatory Visit: Payer: Self-pay

## 2019-06-25 DIAGNOSIS — E119 Type 2 diabetes mellitus without complications: Secondary | ICD-10-CM | POA: Diagnosis not present

## 2019-06-25 LAB — LIPID PANEL
Cholesterol: 197 mg/dL (ref 0–200)
HDL: 35.5 mg/dL — ABNORMAL LOW (ref >39.00)
LDL Cholesterol: 123 mg/dL — ABNORMAL HIGH (ref 0–99)
NonHDL: 161.03
Total CHOL/HDL Ratio: 6
Triglycerides: 189 mg/dL — ABNORMAL HIGH (ref 0.0–149.0)
VLDL: 37.8 mg/dL (ref 0.0–40.0)

## 2019-06-25 LAB — COMPREHENSIVE METABOLIC PANEL
ALT: 29 U/L (ref 0–53)
AST: 26 U/L (ref 0–37)
Albumin: 4.1 g/dL (ref 3.5–5.2)
Alkaline Phosphatase: 37 U/L — ABNORMAL LOW (ref 39–117)
BUN: 23 mg/dL (ref 6–23)
CO2: 27 mEq/L (ref 19–32)
Calcium: 10.1 mg/dL (ref 8.4–10.5)
Chloride: 100 mEq/L (ref 96–112)
Creatinine, Ser: 1.06 mg/dL (ref 0.40–1.50)
GFR: 69.03 mL/min (ref >60.00)
Glucose, Bld: 151 mg/dL — ABNORMAL HIGH (ref 70–99)
Potassium: 3.9 mEq/L (ref 3.5–5.1)
Sodium: 136 mEq/L (ref 135–145)
Total Bilirubin: 0.8 mg/dL (ref 0.2–1.2)
Total Protein: 7.4 g/dL (ref 6.0–8.3)

## 2019-06-25 LAB — HEMOGLOBIN A1C: Hgb A1c MFr Bld: 7 % — ABNORMAL HIGH (ref 4.6–6.5)

## 2019-07-02 ENCOUNTER — Ambulatory Visit (INDEPENDENT_AMBULATORY_CARE_PROVIDER_SITE_OTHER): Payer: Federal, State, Local not specified - PPO | Admitting: Family Medicine

## 2019-07-02 ENCOUNTER — Encounter: Payer: Self-pay | Admitting: Family Medicine

## 2019-07-02 ENCOUNTER — Other Ambulatory Visit: Payer: Self-pay

## 2019-07-02 VITALS — BP 142/82 | HR 81 | Temp 97.0°F | Ht 68.0 in | Wt 189.6 lb

## 2019-07-02 DIAGNOSIS — E119 Type 2 diabetes mellitus without complications: Secondary | ICD-10-CM

## 2019-07-02 DIAGNOSIS — Z0000 Encounter for general adult medical examination without abnormal findings: Secondary | ICD-10-CM

## 2019-07-02 DIAGNOSIS — Z7189 Other specified counseling: Secondary | ICD-10-CM

## 2019-07-02 DIAGNOSIS — I10 Essential (primary) hypertension: Secondary | ICD-10-CM

## 2019-07-02 DIAGNOSIS — E782 Mixed hyperlipidemia: Secondary | ICD-10-CM

## 2019-07-02 MED ORDER — METFORMIN HCL 500 MG PO TABS: ORAL_TABLET | ORAL | 3 refills | Status: DC

## 2019-07-02 MED ORDER — FENOFIBRIC ACID 135 MG PO CPDR: 1.0000 | DELAYED_RELEASE_CAPSULE | Freq: Every day | ORAL | 3 refills | Status: DC

## 2019-07-02 NOTE — Patient Instructions (Addendum)
If you have any low sugars, then cut back on metformin and update me.  Thanks for your effort.   If you get lightheaded, then let me know and cut the benazepril in half.   Recheck in about 6 months.  A1c at the visit.  You don't have to fast.  Update me sooner if needed.  Take care.  Glad to see you.

## 2019-07-02 NOTE — Progress Notes (Signed)
CPE- See plan.  Routine anticipatory guidance given to patient.  See health maintenance.  The possibility exists that previously documented standard health maintenance information may have been brought forward from a previous encounter into this note.  If needed, that same information has been updated to reflect the current situation based on today's encounter.    Diabetes:  Using medications without difficulties: no Hypoglycemic episodes:no Hyperglycemic episodes:no Feet problems: no Blood Sugars averaging: 120s.   eye exam within last year: yes A1c clearly lower, down to 7.   He has been working hard on diet and exercise.  I thanked him for his effort  Hypertension:    Using medication without problems or lightheadedness: yes Chest pain with exertion:no Edema:no Short of breath:no  Elevated Cholesterol: Using medications without problems: yes Muscle aches: no Diet compliance: yes Exercise:yes  covid vaccine d/w pt.   He has 2nd vaccine pending.  Tetanus 2009 shingrix 2019 Flu 2020 PNA up to date.  Colonoscopy 2018 Prostate cancer screening and PSA options(with potential risks and benefits of testing vs not testing) were discussed along with recent recs/guidelines. He declined testing PSAat this point. Diet and exercise d/w pt. Exercise- walking at work. Diet is good.  Living will d/w pt. Wife designated if patient were incapacitated.   PMH and SH reviewed  Meds, vitals, and allergies reviewed.   ROS: Per HPI.  Unless specifically indicated otherwise in HPI, the patient denies:  General: fever. Eyes: acute vision changes ENT: sore throat Cardiovascular: chest pain Respiratory: SOB GI: vomiting GU: dysuria Musculoskeletal: acute back pain Derm: acute rash Neuro: acute motor dysfunction Psych: worsening mood Endocrine: polydipsia Heme: bleeding Allergy: hayfever  GEN: nad, alert and oriented HEENT: ncat NECK: supple w/o LA CV: rrr. PULM: ctab, no inc  wob ABD: soft, +bs EXT: no edema SKIN: no acute rash  Diabetic foot exam: Normal inspection No skin breakdown No calluses  Normal DP pulses Normal sensation to light touch and monofilament Nails normal

## 2019-07-03 NOTE — Assessment & Plan Note (Signed)
No change in meds at this point.  If he continues with work on diet and exercise he may end up having lower blood pressures.  If any symptoms of hypotension then cut benazepril in half and update me.  He agrees.  Labs discussed with patient.

## 2019-07-03 NOTE — Assessment & Plan Note (Signed)
covid vaccine d/w pt.   He has 2nd vaccine pending.  Tetanus 2009 shingrix 2019 Flu 2020 PNA up to date.  Colonoscopy 2018 Prostate cancer screening and PSA options(with potential risks and benefits of testing vs not testing) were discussed along with recent recs/guidelines. He declined testing PSAat this point. Diet and exercise d/w pt. Exercise- walking at work. Diet is good.  Living will d/w pt. Wife designated if patient were incapacitated.

## 2019-07-03 NOTE — Assessment & Plan Note (Signed)
Triglycerides improved.  Would continue statin.  Continue work on diet and exercise.  He agrees.  Labs discussed with patient.

## 2019-07-03 NOTE — Assessment & Plan Note (Signed)
A1c clearly lower, down to 7.  He has been working hard on diet and exercise.  No change in meds at this point.  If he does have any low sugars then I want him to cut back on his Metformin and update me.  Recheck in 6 months, sooner if needed.  He agrees.

## 2019-07-03 NOTE — Assessment & Plan Note (Signed)
Living will d/w pt.  Wife designated if patient were incapacitated.   ?

## 2019-07-07 ENCOUNTER — Ambulatory Visit: Payer: Federal, State, Local not specified - PPO | Attending: Internal Medicine

## 2019-07-07 DIAGNOSIS — Z23 Encounter for immunization: Secondary | ICD-10-CM

## 2019-07-07 NOTE — Progress Notes (Signed)
   Covid-19 Vaccination Clinic  Name:  [name]    MRN: [id number] DOB: [birth year]  07/07/2019  [name] was observed post Covid-19 immunization for 15 minutes without incident. He was provided with Vaccine Information Sheet and instruction to access the V-Safe system.   [name] was instructed to call 911 with any severe reactions post vaccine: Marland Kitchen Difficulty breathing  . Swelling of face and throat  . A fast heartbeat  . A bad rash all over body  . Dizziness and weakness   Immunizations Administered    Name Date Dose VIS Date Route   Pfizer COVID-19 Vaccine 07/07/2019 10:46 AM 0.3 mL 04/03/2019 Intramuscular   Manufacturer: [place]   Lot: [id number]   [place]: [id number]

## 2019-09-25 ENCOUNTER — Other Ambulatory Visit: Payer: Self-pay

## 2019-09-25 ENCOUNTER — Ambulatory Visit: Payer: Federal, State, Local not specified - PPO | Admitting: Family Medicine

## 2019-09-25 ENCOUNTER — Encounter: Payer: Self-pay | Admitting: Family Medicine

## 2019-09-25 DIAGNOSIS — S76212A Strain of adductor muscle, fascia and tendon of left thigh, initial encounter: Secondary | ICD-10-CM

## 2019-09-25 NOTE — Progress Notes (Signed)
This visit occurred during the SARS-CoV-2 public health emergency.  Safety protocols were in place, including screening questions prior to the visit, additional usage of staff PPE, and extensive cleaning of exam room while observing appropriate contact time as indicated for disinfecting solutions.  Possibly groin strain.  Was picking up a 40 pack of water, then a 28 pack of water.  Has also been working in the yard.  Started with sx a few days ago, not at the time of lifting.  L groin discomfort, not all the time.  Some stinging.  No R sided pain.  No FCNAV. No mass or bulge or lump.  L testicle feels a little sore.  No dysuria.  Didn't feel a pop or snap.  Normal BMs.   He had his covid vaccine.  Taking tylenol for pain, with relief.  Meds, vitals, and allergies reviewed.   ROS: Per HPI unless specifically indicated in ROS section   nad Abdomen not tender to palpation.  Normal bowel sounds. No testicular mass noted bilaterally.  Testicles are symmetric. He is slightly tender in the medial left groin without bruising or hernia that I can appreciate.  I do not feel any abdominal mass, none that can move with cough. Normal hip range of motion. Able to bear weight. No spreading redness or other skin changes locally.

## 2019-09-25 NOTE — Patient Instructions (Signed)
Likely a strain but I don't feel a hernia at this point.   Ice for 5 minutes at a time.  Tylenol for pain.  Limit lifting.   Use the exercises on the hand out.   Let me know if not better.  Take care.  Glad to see you.

## 2019-09-27 DIAGNOSIS — S76219A Strain of adductor muscle, fascia and tendon of unspecified thigh, initial encounter: Secondary | ICD-10-CM | POA: Insufficient documentation

## 2019-09-27 NOTE — Assessment & Plan Note (Signed)
Discussed options. Likely a strain but I don't feel a hernia at this point.   Ice for 5 minutes at a time.  Tylenol for pain.  Limit lifting.   He could have a strain at the abdominal wall insertion near his pubic bone or near the origin of muscles in the left groin.  Either way he can use the exercises on the handout I gave him an update me as needed.  He agrees.

## 2020-01-08 ENCOUNTER — Encounter: Payer: Self-pay | Admitting: Family Medicine

## 2020-01-08 ENCOUNTER — Other Ambulatory Visit: Payer: Self-pay

## 2020-01-08 ENCOUNTER — Ambulatory Visit: Payer: Federal, State, Local not specified - PPO | Admitting: Family Medicine

## 2020-01-08 VITALS — BP 156/84 | HR 67 | Temp 97.1°F | Ht 68.0 in | Wt 189.2 lb

## 2020-01-08 DIAGNOSIS — E119 Type 2 diabetes mellitus without complications: Secondary | ICD-10-CM | POA: Diagnosis not present

## 2020-01-08 LAB — POCT GLYCOSYLATED HEMOGLOBIN (HGB A1C): Hemoglobin A1C: 8.6 % — AB (ref 4.0–5.6)

## 2020-01-08 MED ORDER — METFORMIN HCL 500 MG PO TABS: ORAL_TABLET | ORAL | 3 refills | Status: DC

## 2020-01-08 NOTE — Progress Notes (Signed)
This visit occurred during the SARS-CoV-2 public health emergency.  Safety protocols were in place, including screening questions prior to the visit, additional usage of staff PPE, and extensive cleaning of exam room while observing appropriate contact time as indicated for disinfecting solutions.  Diabetes:  Using medications without difficulties: still on metformin 1 tab BID.  No ADE on med.   Hypoglycemic episodes: no Hyperglycemic episodes: no Feet problems: no Blood Sugars averaging: ~130s.   eye exam within last year:  Has f/u pending for next month.   He is working on his weight with diet and exercise.  D/w pt.   A1c discussed with patient at office visit.  He had his covid vaccine.  Flu shot today.    Meds, vitals, and allergies reviewed.  ROS: Per HPI unless specifically indicated in ROS section   GEN: nad, alert and oriented HEENT: ncat NECK: supple w/o LA CV: rrr. PULM: ctab, no inc wob ABD: soft, +bs EXT: no edema SKIN: no acute rash

## 2020-01-08 NOTE — Patient Instructions (Signed)
Flu shot today.  Thanks for getting that done.  Plan on recheck in about 6 months at a yearly visit with labs ahead of time.   Try increasing the dose of metformin as tolerated.  Update me as needed.  Thanks for your effort.  Take care.  Glad to see you.

## 2020-01-10 NOTE — Assessment & Plan Note (Signed)
Continue work on diet and exercise. Plan on recheck in about 6 months at a yearly visit with labs ahead of time.  Try increasing the dose of metformin as tolerated.  Update me as needed.  Routine cautions given to patient.  He agrees.

## 2020-01-26 LAB — HM DIABETES EYE EXAM

## 2020-02-05 ENCOUNTER — Encounter: Payer: Self-pay | Admitting: Family Medicine

## 2020-02-06 ENCOUNTER — Ambulatory Visit: Payer: Federal, State, Local not specified - PPO | Attending: Internal Medicine

## 2020-02-06 ENCOUNTER — Other Ambulatory Visit: Payer: Self-pay

## 2020-02-06 DIAGNOSIS — Z23 Encounter for immunization: Secondary | ICD-10-CM

## 2020-02-06 NOTE — Progress Notes (Signed)
   Covid-19 Vaccination Clinic  Name:  VALE MOUSSEAU    MRN: [id number] DOB: 02-23-1951  02/06/2020  Mr. Simson was observed post Covid-19 immunization for 15 minutes without incident. He was provided with Vaccine Information Sheet and instruction to access the V-Safe system.   Mr. Darling was instructed to call 911 with any severe reactions post vaccine: Marland Kitchen Difficulty breathing  . Swelling of face and throat  . A fast heartbeat  . A bad rash all over body  . Dizziness and weakness

## 2020-03-10 ENCOUNTER — Other Ambulatory Visit: Payer: Self-pay | Admitting: Family Medicine

## 2020-03-11 NOTE — Telephone Encounter (Signed)
Pharmacy requests refill on: Benazepril HCL 20 mg  LAST REFILL: 12/11/2019 LAST OV: 01/08/2020 NEXT OV: Not Scheduled  PHARMACY: [place], [place]

## 2020-03-30 ENCOUNTER — Other Ambulatory Visit: Payer: Self-pay | Admitting: Family Medicine

## 2020-04-07 ENCOUNTER — Other Ambulatory Visit: Payer: Self-pay | Admitting: Family Medicine

## 2020-04-07 DIAGNOSIS — I10 Essential (primary) hypertension: Secondary | ICD-10-CM

## 2020-04-21 ENCOUNTER — Other Ambulatory Visit: Payer: Self-pay | Admitting: Family Medicine

## 2020-05-29 ENCOUNTER — Other Ambulatory Visit: Payer: Self-pay | Admitting: Family Medicine

## 2020-06-25 ENCOUNTER — Other Ambulatory Visit: Payer: Self-pay | Admitting: Family Medicine

## 2020-06-25 DIAGNOSIS — E119 Type 2 diabetes mellitus without complications: Secondary | ICD-10-CM

## 2020-07-07 ENCOUNTER — Other Ambulatory Visit: Payer: Self-pay

## 2020-07-07 ENCOUNTER — Other Ambulatory Visit (INDEPENDENT_AMBULATORY_CARE_PROVIDER_SITE_OTHER): Payer: Federal, State, Local not specified - PPO

## 2020-07-07 DIAGNOSIS — E119 Type 2 diabetes mellitus without complications: Secondary | ICD-10-CM

## 2020-07-07 LAB — COMPREHENSIVE METABOLIC PANEL
ALT: 28 U/L (ref 0–53)
AST: 27 U/L (ref 0–37)
Albumin: 3.9 g/dL (ref 3.5–5.2)
Alkaline Phosphatase: 36 U/L — ABNORMAL LOW (ref 39–117)
BUN: 18 mg/dL (ref 6–23)
CO2: 26 mEq/L (ref 19–32)
Calcium: 9.5 mg/dL (ref 8.4–10.5)
Chloride: 103 mEq/L (ref 96–112)
Creatinine, Ser: 0.93 mg/dL (ref 0.40–1.50)
GFR: 82.77 mL/min (ref >60.00)
Glucose, Bld: 150 mg/dL — ABNORMAL HIGH (ref 70–99)
Potassium: 3.5 mEq/L (ref 3.5–5.1)
Sodium: 137 mEq/L (ref 135–145)
Total Bilirubin: 0.7 mg/dL (ref 0.2–1.2)
Total Protein: 7.2 g/dL (ref 6.0–8.3)

## 2020-07-07 LAB — LIPID PANEL
Cholesterol: 185 mg/dL (ref 0–200)
HDL: 34.2 mg/dL — ABNORMAL LOW (ref >39.00)
NonHDL: 150.67
Total CHOL/HDL Ratio: 5
Triglycerides: 203 mg/dL — ABNORMAL HIGH (ref 0.0–149.0)
VLDL: 40.6 mg/dL — ABNORMAL HIGH (ref 0.0–40.0)

## 2020-07-07 LAB — LDL CHOLESTEROL, DIRECT: Direct LDL: 127 mg/dL

## 2020-07-07 LAB — HEMOGLOBIN A1C: Hgb A1c MFr Bld: 7.5 % — ABNORMAL HIGH (ref 4.6–6.5)

## 2020-07-28 ENCOUNTER — Ambulatory Visit (INDEPENDENT_AMBULATORY_CARE_PROVIDER_SITE_OTHER): Payer: Federal, State, Local not specified - PPO | Admitting: Family Medicine

## 2020-07-28 ENCOUNTER — Other Ambulatory Visit: Payer: Self-pay

## 2020-07-28 ENCOUNTER — Encounter: Payer: Self-pay | Admitting: Family Medicine

## 2020-07-28 VITALS — BP 190/88 | HR 92 | Temp 97.2°F | Ht 68.0 in | Wt 190.0 lb

## 2020-07-28 DIAGNOSIS — Z0000 Encounter for general adult medical examination without abnormal findings: Secondary | ICD-10-CM

## 2020-07-28 DIAGNOSIS — E782 Mixed hyperlipidemia: Secondary | ICD-10-CM

## 2020-07-28 DIAGNOSIS — Z7189 Other specified counseling: Secondary | ICD-10-CM

## 2020-07-28 DIAGNOSIS — E119 Type 2 diabetes mellitus without complications: Secondary | ICD-10-CM

## 2020-07-28 DIAGNOSIS — I10 Essential (primary) hypertension: Secondary | ICD-10-CM

## 2020-07-28 MED ORDER — METFORMIN HCL 500 MG PO TABS: ORAL_TABLET | ORAL | 3 refills | Status: DC

## 2020-07-28 MED ORDER — PRAVASTATIN SODIUM 20 MG PO TABS: 20.0000 mg | ORAL_TABLET | Freq: Every day | ORAL | 3 refills | Status: DC

## 2020-07-28 MED ORDER — BENAZEPRIL HCL 20 MG PO TABS: 20.0000 mg | ORAL_TABLET | Freq: Every day | ORAL | 3 refills | Status: DC

## 2020-07-28 MED ORDER — METOPROLOL TARTRATE 50 MG PO TABS: 50.0000 mg | ORAL_TABLET | Freq: Two times a day (BID) | ORAL | 3 refills | Status: DC

## 2020-07-28 NOTE — Patient Instructions (Addendum)
Plan on recheck A1c at a visit with Damita Dunnings in about 6 months.  You don't have to fast.   Update me about your BP out of clinic.  Take care.  Glad to see you.

## 2020-07-28 NOTE — Progress Notes (Signed)
This visit occurred during the SARS-CoV-2 public health emergency.  Safety protocols were in place, including screening questions prior to the visit, additional usage of staff PPE, and extensive cleaning of exam room while observing appropriate contact time as indicated for disinfecting solutions.  CPE- See plan.  Routine anticipatory guidance given to patient.  See health maintenance.  The possibility exists that previously documented standard health maintenance information may have been brought forward from a previous encounter into this note.  If needed, that same information has been updated to reflect the current situation based on today's encounter.    covid vaccine 2021 Tetanus 2009, d/w pt.   shingrix 2020 Flu prev done.   PNA up to date.  Colonoscopy 2018 Prostate cancer screening and PSA options(with potential risks and benefits of testing vs not testing) were discussed along with recent recs/guidelines. He declined testing PSAat this point. Diet and exercise d/w pt. Exercise- walking at work. Diet is good. he is going to the gym.   Living will d/w pt. Wife designated if patient were incapacitated.   Diabetes:  Using medications without difficulties:  Usually taking 1 metformin BID.  No ADE on med.   Hypoglycemic episodes:no Hyperglycemic episodes:no Feet problems: no Blood Sugars averaging: 120-130s.   eye exam within last year: yes  Hypertension:    Using medication without problems or lightheadedness: yes Chest pain with exertion:no Edema:no Short of breath:no He can exert on treadmill w/o troubles.   See AVS.  Likely white coat HTN and he'll check BP out of clinic.    Elevated Cholesterol: Using medications without problems: yes Muscle aches: no Diet compliance:yes Exercise:yes  Labs d/w pt.    PMH and SH reviewed  Meds, vitals, and allergies reviewed.   ROS: Per HPI.  Unless specifically indicated otherwise in HPI, the patient denies:  General:  fever. Eyes: acute vision changes ENT: sore throat Cardiovascular: chest pain Respiratory: SOB GI: vomiting GU: dysuria Musculoskeletal: acute back pain Derm: acute rash Neuro: acute motor dysfunction Psych: worsening mood Endocrine: polydipsia Heme: bleeding Allergy: hayfever  GEN: nad, alert and oriented HEENT: ncat NECK: supple w/o LA CV: rrr. PULM: ctab, no inc wob ABD: soft, +bs EXT: no edema SKIN: Well-perfused.  Diabetic foot exam: Normal inspection No skin breakdown No calluses  Normal DP pulses Normal sensation to light touch and monofilament Nails normal

## 2020-07-31 NOTE — Assessment & Plan Note (Signed)
Living will d/w pt.  Wife designated if patient were incapacitated.   ?

## 2020-07-31 NOTE — Assessment & Plan Note (Signed)
See after visit summary regarding likely whitecoat hypertension.  He will check his blood pressure out of clinic.  I am worried that if we put him on a blood pressure medication to normalize his pressure here he will be hypotensive out of clinic.  Labs discussed with patient.  Continue benazepril

## 2020-07-31 NOTE — Assessment & Plan Note (Signed)
covid vaccine 2021 Tetanus 2009, d/w pt.   shingrix 2020 Flu prev done.   PNA up to date.  Colonoscopy 2018 Prostate cancer screening and PSA options(with potential risks and benefits of testing vs not testing) were discussed along with recent recs/guidelines. He declined testing PSAat this point. Diet and exercise d/w pt. Exercise- walking at work. Diet is good. he is going to the gym.   Living will d/w pt. Wife designated if patient were incapacitated.

## 2020-07-31 NOTE — Assessment & Plan Note (Signed)
Continue Metformin.  Continue work on diet and exercise.  He will update me as needed.  Recheck periodically.  He agrees with plan.

## 2020-07-31 NOTE — Assessment & Plan Note (Signed)
Labs discussed with patient.  Continue pravastatin. 

## 2020-08-14 ENCOUNTER — Encounter: Payer: Self-pay | Admitting: Internal Medicine

## 2020-09-01 ENCOUNTER — Telehealth: Payer: Self-pay | Admitting: Family Medicine

## 2020-09-01 ENCOUNTER — Telehealth: Payer: Self-pay | Admitting: Internal Medicine

## 2020-09-01 NOTE — Telephone Encounter (Signed)
Given his history of polyps, colonoscopy is recommended instead of cologuard.  I would have him f/u with GI.  Thanks.

## 2020-09-01 NOTE — Telephone Encounter (Signed)
Good afternoon Dr. Carlean Purl, patient called in regards to his reminder letter to have a repeat colonoscopy but patient wants to know if he can do cologuard instead.  Please advise.  Thank you.

## 2020-09-01 NOTE — Telephone Encounter (Signed)
[name] called in he received a letter about its time for him to do his colonoscopy and wanted to know if he could be sent a cologuard box to his home.  Please advise

## 2020-09-02 ENCOUNTER — Encounter: Payer: Self-pay | Admitting: Internal Medicine

## 2020-09-02 NOTE — Telephone Encounter (Signed)
Left message for patient to call back  

## 2020-09-02 NOTE — Telephone Encounter (Signed)
Called patient and informed him of Dr. Carlean Purl response.  Patient is scheduled for procedure on 11/24/2020.

## 2020-09-02 NOTE — Telephone Encounter (Signed)
Please explained to the patient that I have reviewed things but a Cologuard is not appropriate in somebody who has had colon polyps before.  He should repeat a colonoscopy.  We are doing polyp surveillance.  Cologuard is only appropriate when someone has never had polyps.

## 2020-09-06 NOTE — Telephone Encounter (Signed)
Patient notified to have colonoscopy done and he already has scheduled for July.

## 2020-11-10 ENCOUNTER — Other Ambulatory Visit: Payer: Self-pay

## 2020-11-10 ENCOUNTER — Ambulatory Visit (AMBULATORY_SURGERY_CENTER): Payer: Self-pay | Admitting: *Deleted

## 2020-11-10 VITALS — Ht 68.0 in | Wt 188.0 lb

## 2020-11-10 DIAGNOSIS — Z86010 Personal history of colonic polyps: Secondary | ICD-10-CM

## 2020-11-10 NOTE — Progress Notes (Signed)
Patient is here in-person for PV. Patient denies any allergies to eggs or soy. Patient denies any problems with anesthesia/sedation. Patient denies any oxygen use at home. Patient denies taking any diet/weight loss medications or blood thinners. Patient is not being treated for MRSA or C-diff. Patient is aware of our care-partner policy and [id number] safety protocol. Patient is COVID-19 vaccinated, per patient.

## 2020-11-21 HISTORY — PX: COLONOSCOPY: SHX174

## 2020-11-24 ENCOUNTER — Encounter: Payer: Self-pay | Admitting: Internal Medicine

## 2020-11-24 ENCOUNTER — Ambulatory Visit (AMBULATORY_SURGERY_CENTER): Payer: Federal, State, Local not specified - PPO | Admitting: Internal Medicine

## 2020-11-24 ENCOUNTER — Other Ambulatory Visit: Payer: Self-pay

## 2020-11-24 VITALS — BP 128/56 | HR 59 | Temp 97.1°F | Resp 10 | Ht 68.0 in | Wt 188.0 lb

## 2020-11-24 DIAGNOSIS — D123 Benign neoplasm of transverse colon: Secondary | ICD-10-CM

## 2020-11-24 DIAGNOSIS — Z86010 Personal history of colonic polyps: Secondary | ICD-10-CM | POA: Diagnosis present

## 2020-11-24 DIAGNOSIS — K635 Polyp of colon: Secondary | ICD-10-CM | POA: Diagnosis not present

## 2020-11-24 DIAGNOSIS — D120 Benign neoplasm of cecum: Secondary | ICD-10-CM

## 2020-11-24 DIAGNOSIS — K621 Rectal polyp: Secondary | ICD-10-CM | POA: Diagnosis not present

## 2020-11-24 DIAGNOSIS — D128 Benign neoplasm of rectum: Secondary | ICD-10-CM

## 2020-11-24 DIAGNOSIS — D124 Benign neoplasm of descending colon: Secondary | ICD-10-CM

## 2020-11-24 MED ORDER — SODIUM CHLORIDE 0.9 % IV SOLN: 500.0000 mL | INTRAVENOUS | Status: DC

## 2020-11-24 NOTE — Progress Notes (Signed)
Called to room to assist during endoscopic procedure.  Patient ID and intended procedure confirmed with present staff. Received instructions for my participation in the procedure from the performing physician.  

## 2020-11-24 NOTE — Patient Instructions (Addendum)
I found and removed 9 tiny polyps - all look benign.  I will let you know pathology results and when to have another routine colonoscopy by mail and/or My Chart.  I appreciate the opportunity to care for you. Gatha Mayer, MD, [facility]  Thank you for letting us take care of your healthcare needs today. Please see handouts given to you on Polyps.  YOU HAD AN ENDOSCOPIC PROCEDURE TODAY AT [facility]:   Refer to the procedure report that was given to you for any specific questions about what was found during the examination.  If the procedure report does not answer your questions, please call your gastroenterologist to clarify.  If you requested that your care partner not be given the details of your procedure findings, then the procedure report has been included in a sealed envelope for you to review at your convenience later.  YOU SHOULD EXPECT: Some feelings of bloating in the abdomen. Passage of more gas than usual.  Walking can help get rid of the air that was put into your GI tract during the procedure and reduce the bloating. If you had a lower endoscopy (such as a colonoscopy or flexible sigmoidoscopy) you may notice spotting of blood in your stool or on the toilet paper. If you underwent a bowel prep for your procedure, you may not have a normal bowel movement for a few days.  Please Note:  You might notice some irritation and congestion in your nose or some drainage.  This is from the oxygen used during your procedure.  There is no need for concern and it should clear up in a day or so.  SYMPTOMS TO REPORT IMMEDIATELY:  Following lower endoscopy (colonoscopy or flexible sigmoidoscopy):  Excessive amounts of blood in the stool  Significant tenderness or worsening of abdominal pains  Swelling of the abdomen that is new, acute  Fever of 100F or higher  For urgent or emergent issues, a gastroenterologist can be reached at any hour by calling [phone]. Do not use  MyChart messaging for urgent concerns.    DIET:  We do recommend a small meal at first, but then you may proceed to your regular diet.  Drink plenty of fluids but you should avoid alcoholic beverages for 24 hours.  ACTIVITY:  You should plan to take it easy for the rest of today and you should NOT DRIVE or use heavy machinery until tomorrow (because of the sedation medicines used during the test).    FOLLOW UP: Our staff will call the number listed on your records 48-72 hours following your procedure to check on you and address any questions or concerns that you may have regarding the information given to you following your procedure. If we do not reach you, we will leave a message.  We will attempt to reach you two times.  During this call, we will ask if you have developed any symptoms of COVID 19. If you develop any symptoms (ie: fever, flu-like symptoms, shortness of breath, cough etc.) before then, please call [phone].  If you test positive for Covid 19 in the 2 weeks post procedure, please call and report this information to [place].    If any biopsies were taken you will be contacted by phone or by letter within the next 1-3 weeks.  Please call us at [phone] if you have not heard about the biopsies in 3 weeks.    SIGNATURES/CONFIDENTIALITY: You and/or your care partner have signed paperwork which  will be entered into your electronic medical record.  These signatures attest to the fact that that the information above on your After Visit Summary has been reviewed and is understood.  Full responsibility of the confidentiality of this discharge information lies with you and/or your care-partner.

## 2020-11-24 NOTE — Op Note (Signed)
[facility] Patient Name: [name] Procedure Date: 11/24/2020 10:45 AM MRN: [id number] Endoscopist: Gatha Mayer , MD Age: 70 Referring MD:  Date of Birth: Dec 07, 1950 Gender: Male Account #: [id number] Procedure:                Colonoscopy Indications:              Surveillance: Personal history of adenomatous                            polyps on last colonoscopy > 3 years ago, Last                            colonoscopy: 2018 Medicines:                Propofol per Anesthesia, Monitored Anesthesia Care Procedure:                Pre-Anesthesia Assessment:                           - Prior to the procedure, a History and Physical                            was performed, and patient medications and                            allergies were reviewed. The patient's tolerance of                            previous anesthesia was also reviewed. The risks                            and benefits of the procedure and the sedation                            options and risks were discussed with the patient.                            All questions were answered, and informed consent                            was obtained. Prior Anticoagulants: The patient has                            taken no previous anticoagulant or antiplatelet                            agents. ASA Grade Assessment: II - A patient with                            mild systemic disease. After reviewing the risks                            and benefits, the patient was deemed in  satisfactory condition to undergo the procedure.                           After obtaining informed consent, the colonoscope                            was passed under direct vision. Throughout the                            procedure, the patient's blood pressure, pulse, and                            oxygen saturations were monitored continuously. The                            CF [id number] was  introduced through the anus                            and advanced to the the cecum, identified by                            appendiceal orifice and ileocecal valve. The                            colonoscopy was performed without difficulty. The                            patient tolerated the procedure well. The quality                            of the bowel preparation was excellent. The                            ileocecal valve, appendiceal orifice, and rectum                            were photographed. The bowel preparation used was                            Miralax via split dose instruction. Scope In: 10:58:03 AM Scope Out: 11:19:20 AM Scope Withdrawal Time: 0 hours 18 minutes 53 seconds  Total Procedure Duration: 0 hours 21 minutes 17 seconds  Findings:                 The perianal and digital rectal examinations were                            normal. Pertinent negatives include normal prostate                            (size, shape, and consistency).                           Nine sessile polyps were found in the rectum,  descending colon, transverse colon and cecum. The                            polyps were diminutive in size. These polyps were                            removed with a cold snare. Resection and retrieval                            were complete. Verification of patient                            identification for the specimen was done. Estimated                            blood loss was minimal.                           The exam was otherwise without abnormality on                            direct and retroflexion views. Complications:            No immediate complications. Estimated Blood Loss:     Estimated blood loss was minimal. Impression:               - Nine diminutive polyps in the rectum, in the                            descending colon, in the transverse colon and in                            the cecum,  removed with a cold snare. Resected and                            retrieved.                           - The examination was otherwise normal on direct                            and retroflexion views.                           - Personal history of colonic polyps. 4 adenomas 1                            ssp 2018 Recommendation:           - Patient has a contact number available for                            emergencies. The signs and symptoms of potential                            delayed complications were  discussed with the                            patient. Return to normal activities tomorrow.                            Written discharge instructions were provided to the                            patient.                           - Resume previous diet.                           - Continue present medications.                           - Repeat colonoscopy is recommended for                            surveillance. The colonoscopy date will be                            determined after pathology results from today's                            exam become available for review. Gatha Mayer, MD 11/24/2020 11:30:45 AM This report has been signed electronically.

## 2020-11-24 NOTE — Progress Notes (Signed)
To pacu, VSS. Report to [email or web address] 

## 2020-11-24 NOTE — Progress Notes (Signed)
Vs CW I have reviewed the patient's medical history in detail and updated the computerized patient record.   

## 2020-11-28 ENCOUNTER — Telehealth: Payer: Self-pay

## 2020-11-28 ENCOUNTER — Telehealth: Payer: Self-pay | Admitting: *Deleted

## 2020-11-28 NOTE — Telephone Encounter (Signed)
Attempted f/u call back. No answer, left VM. 

## 2020-11-28 NOTE — Telephone Encounter (Signed)
Left message on f/u call 

## 2020-12-07 ENCOUNTER — Encounter: Payer: Self-pay | Admitting: Nurse Practitioner

## 2020-12-07 ENCOUNTER — Telehealth (INDEPENDENT_AMBULATORY_CARE_PROVIDER_SITE_OTHER): Payer: Federal, State, Local not specified - PPO | Admitting: Nurse Practitioner

## 2020-12-07 VITALS — Temp 98.1°F | Ht 68.0 in

## 2020-12-07 DIAGNOSIS — U071 COVID-19: Secondary | ICD-10-CM | POA: Insufficient documentation

## 2020-12-07 MED ORDER — MOLNUPIRAVIR EUA 200MG CAPSULE: 4.0000 | ORAL_CAPSULE | Freq: Two times a day (BID) | ORAL | 0 refills | Status: AC

## 2020-12-07 NOTE — Progress Notes (Signed)
Patient ID: [name], male    DOB: October 05, 1950, 70 y.o.   MRN: [id number]  Virtual visit completed through Telephone. Due to national recommendations of social distancing due to COVID-19, a virtual visit is felt to be most appropriate for this patient at this time. Reviewed limitations, risks, security and privacy concerns of performing a virtual visit and the availability of in person appointments. I also reviewed that there may be a patient responsible charge related to this service. The patient agreed to proceed.   Patient location: home Provider location: [place] at [facility], office Persons participating in this virtual visit: patient, provider Spouse  If any vitals were documented, they were collected by patient at home unless specified below.    Temp 98.1 F (36.7 C) Comment: per patient  Ht '5\' 8"'$  (1.727 m)   BMI 28.59 kg/m    CC: Covid 19 infection  Subjective:   HPI: [name] is a 70 y.o. male presenting on 12/07/2020 for acute telephone visit. Patient was tested at local [facility] on 0/14/2022 with positive results. He started having symptoms on 12/02/2020. Patient has been using Robitussin D and mucinex OTC with mild relief   Relevant past medical, surgical, family and social history reviewed and updated as indicated. Interim medical history since our last visit reviewed. Allergies and medications reviewed and updated. Outpatient Medications Prior to Visit  Medication Sig Dispense Refill   benazepril (LOTENSIN) 20 MG tablet Take 1 tablet (20 mg total) by mouth daily. 90 tablet 3   Choline Fenofibrate (FENOFIBRIC ACID) 135 MG CPDR TAKE 1 CAPSULE BY MOUTH EVERY DAY 90 capsule 3   folic acid (FOLVITE) 1 MG tablet TAKE 1 TABLET BY MOUTH EVERY DAY 90 tablet 3   glucose blood test strip USE DAILY TO CHECK SUGAR 100 each 3   Lancets (ONETOUCH ULTRASOFT) lancets Use as instructed 100 each 12   metFORMIN (GLUCOPHAGE) 500 MG tablet TAKE 1-2 TABLETS BY MOUTH IN THE  MORNING AND 1-2 IN THE EVENING 360 tablet 3   metoprolol tartrate (LOPRESSOR) 50 MG tablet Take 1 tablet (50 mg total) by mouth 2 (two) times daily. 180 tablet 3   pravastatin (PRAVACHOL) 20 MG tablet Take 1 tablet (20 mg total) by mouth daily. 90 tablet 3   Soft Lens Products (REFRESH CONTACTS DROPS) SOLN Place 1 drop into both eyes daily.     No facility-administered medications prior to visit.     Per HPI unless specifically indicated in ROS section below Review of Systems  Constitutional:  Negative for chills and fever.  HENT:  Positive for congestion, sore throat and voice change.   Respiratory:  Positive for cough (with yellow phlegm production). Negative for shortness of breath.   Cardiovascular:  Negative for chest pain.  Gastrointestinal:  Negative for abdominal pain, constipation, diarrhea, nausea and vomiting.  Neurological:  Positive for headaches.  Objective:  Temp 98.1 F (36.7 C) Comment: per patient  Ht '5\' 8"'$  (1.727 m)   BMI 28.59 kg/m   Wt Readings from Last 3 Encounters:  11/24/20 188 lb (85.3 kg)  11/10/20 188 lb (85.3 kg)  07/28/20 190 lb (86.2 kg)       Physical exam: Gen: alert, NAD, not ill appearing Pulm: speaks in complete sentences without increased work of breathing Psych: normal mood, normal thought content      Results for orders placed or performed in visit on 07/07/20  Hemoglobin A1c  Result Value Ref Range   Hgb A1c MFr  Bld 7.5 (H) 4.6 - 6.5 %  Lipid panel  Result Value Ref Range   Cholesterol 185 0 - 200 mg/dL   Triglycerides 203.0 (H) 0.0 - 149.0 mg/dL   HDL 34.20 (L) >39.00 mg/dL   VLDL 40.6 (H) 0.0 - 40.0 mg/dL   Total CHOL/HDL Ratio 5    NonHDL 150.67   Comprehensive metabolic panel  Result Value Ref Range   Sodium 137 135 - 145 mEq/L   Potassium 3.5 3.5 - 5.1 mEq/L   Chloride 103 96 - 112 mEq/L   CO2 26 19 - 32 mEq/L   Glucose, Bld 150 (H) 70 - 99 mg/dL   BUN 18 6 - 23 mg/dL   Creatinine, Ser 0.93 0.40 - 1.50 mg/dL   Total  Bilirubin 0.7 0.2 - 1.2 mg/dL   Alkaline Phosphatase 36 (L) 39 - 117 U/L   AST 27 0 - 37 U/L   ALT 28 0 - 53 U/L   Total Protein 7.2 6.0 - 8.3 g/dL   Albumin 3.9 3.5 - 5.2 g/dL   GFR 82.77 >60.00 mL/min   Calcium 9.5 8.4 - 10.5 mg/dL  LDL cholesterol, direct  Result Value Ref Range   Direct LDL 127.0 mg/dL   Assessment & Plan:   Problem List Items Addressed This Visit       Other   COVID-19 virus infection - Primary    Discussed treatment options with patient.  We decided to go with molnupiravir.  Sent Rx to pharmacy of choice.  Patient to begin immediately. Start molnupiravir. Signs and symptoms reviewed as when to seek urgent or emergent evaluation.       Relevant Medications   molnupiravir EUA 200 mg CAPS     No orders of the defined types were placed in this encounter.  No orders of the defined types were placed in this encounter.   I discussed the assessment and treatment plan with the patient. The patient was provided an opportunity to ask questions and all were answered. The patient agreed with the plan and demonstrated an understanding of the instructions. The patient was advised to call back or seek an in-person evaluation if the symptoms worsen or if the condition fails to improve as anticipated.  Follow up plan: No follow-ups on file.  Romilda Garret, NP

## 2020-12-07 NOTE — Assessment & Plan Note (Signed)
Discussed treatment options with patient.  We decided to go with molnupiravir.  Sent Rx to pharmacy of choice.  Patient to begin immediately. Start molnupiravir. Signs and symptoms reviewed as when to seek urgent or emergent evaluation.

## 2020-12-10 ENCOUNTER — Encounter: Payer: Self-pay | Admitting: Internal Medicine

## 2020-12-10 DIAGNOSIS — Z86010 Personal history of colonic polyps: Secondary | ICD-10-CM

## 2020-12-15 ENCOUNTER — Telehealth: Payer: Self-pay

## 2020-12-15 NOTE — Telephone Encounter (Signed)
Patient called stating that he had a visit with Romilda Garret on 12/07/20 for Covid, he finished anti-viral medication and he feels  "pretty good" no cough or temperature. He re tested himself on 12/12/20 with home kit and the control line was dark and the second line was faint. He re checked today 12/15/20 and the control line was faint and the second line was dark. They wanted to confirm that it means he is still positive for Covid? How long is he considered contagious? Does he need to re check again at a later time?  I confirmed with patient and his wife that they are checking the results at the right time as instructed on the home kit box and they verified yes.

## 2020-12-16 NOTE — Telephone Encounter (Signed)
Patient advised and verbalized understanding 

## 2021-01-27 ENCOUNTER — Ambulatory Visit: Payer: Federal, State, Local not specified - PPO | Admitting: Family Medicine

## 2021-01-27 ENCOUNTER — Other Ambulatory Visit: Payer: Self-pay

## 2021-01-27 ENCOUNTER — Encounter: Payer: Self-pay | Admitting: Family Medicine

## 2021-01-27 VITALS — BP 122/82 | HR 85 | Temp 98.1°F | Ht 68.0 in | Wt 179.0 lb

## 2021-01-27 DIAGNOSIS — E119 Type 2 diabetes mellitus without complications: Secondary | ICD-10-CM

## 2021-01-27 LAB — POCT GLYCOSYLATED HEMOGLOBIN (HGB A1C): Hemoglobin A1C: 6.9 % — AB (ref 4.0–5.6)

## 2021-01-27 MED ORDER — FOLIC ACID 1 MG PO TABS: 1.0000 mg | ORAL_TABLET | Freq: Every day | ORAL | 3 refills | Status: DC

## 2021-01-27 MED ORDER — ONETOUCH ULTRASOFT LANCETS MISC: 12 refills | Status: DC

## 2021-01-27 MED ORDER — ALCOHOL SWABS 70 % PADS: MEDICATED_PAD | 12 refills | Status: DC

## 2021-01-27 NOTE — Patient Instructions (Addendum)
If you get lightheaded, but the benazepril in half.   If any low sugars, then cut metformin back to 1 tab twice a day.  Take care.  Glad to see you.  Plan on a yearly visit in about 6 months with labs ahead of time.   Thanks for your effort.

## 2021-01-27 NOTE — Progress Notes (Signed)
This visit occurred during the SARS-CoV-2 public health emergency.  Safety protocols were in place, including screening questions prior to the visit, additional usage of staff PPE, and extensive cleaning of exam room while observing appropriate contact time as indicated for disinfecting solutions.  Diabetes:  Using medications without difficulties: yes Hypoglycemic episodes: no Hyperglycemic episodes:no Feet problems: no Blood Sugars averaging: 98-113 recently.   eye exam within last year: f/u pending for next week.   A1c 6.9, d/w pt at OV.   He has intentional weight loss, 1 mile walk most days.  Paying attention to diet.  He feels good.   Taking metformin 1 tab AM and 2 tabs PM.    He isn't lightheaded.  Cautions d/w pt re: BP, see avs.    Meds, vitals, and allergies reviewed.  ROS: Per HPI unless specifically indicated in ROS section   GEN: nad, alert and oriented HEENT: ncat NECK: supple w/o LA CV: rrr. PULM: ctab, no inc wob ABD: soft, +bs EXT: no edema SKIN: no acute rash

## 2021-01-29 NOTE — Assessment & Plan Note (Signed)
A1c 6.9, d/w pt at OV.   He has intentional weight loss, 1 mile walk most days.  Paying attention to diet.  He feels good.   Taking metformin 1 tab AM and 2 tabs PM.  He can cut back if he has any low sugars. He isn't lightheaded.  Cautions d/w pt re: BP, see avs.   Recheck in about 6 months at routine yearly visit.

## 2021-04-12 ENCOUNTER — Encounter: Payer: Self-pay | Admitting: Family Medicine

## 2021-04-12 ENCOUNTER — Other Ambulatory Visit: Payer: Self-pay

## 2021-04-12 ENCOUNTER — Ambulatory Visit: Payer: Federal, State, Local not specified - PPO | Admitting: Family Medicine

## 2021-04-12 VITALS — BP 160/80 | HR 91 | Temp 97.7°F | Ht 68.0 in | Wt 184.0 lb

## 2021-04-12 DIAGNOSIS — L600 Ingrowing nail: Secondary | ICD-10-CM | POA: Diagnosis not present

## 2021-04-12 NOTE — Progress Notes (Signed)
[name] Mander, MD, [name] at [facility] [place], [address]  Phone: [phone]   FAX: [place] - 70 y.o. male   MRN [id number]   Date of Birth: 11-26-1950  Date: 04/12/2021   PCP: Tonia Ghent, MD   Referral: Tonia Ghent, MD  Chief Complaint  Patient presents with   Toe Pain    Right Big Toe    This visit occurred during the SARS-CoV-2 public health emergency.  Safety protocols were in place, including screening questions prior to the visit, additional usage of staff PPE, and extensive cleaning of exam room while observing appropriate contact time as indicated for disinfecting solutions.   Subjective:   [name] is a 70 y.o. very pleasant male patient with Body mass index is 27.98 kg/m. who presents with the following:  R big toe pain: Patient is having some medial great toe pain.  This is been present for a couple of days.  There is no redness or warmth associated with it.  He does work at [employer] and repetitively does use this foot.  There is been no trauma.  Review of Systems is noted in the HPI, as appropriate  Objective:   BP (!) 160/80    Pulse 91    Temp 97.7 F (36.5 C) (Temporal)    Ht 5\' 8"  (1.727 m)    Wt 184 lb (83.5 kg)    SpO2 98%    BMI 27.98 kg/m   GEN: No acute distress; alert,appropriate. PULM: Breathing comfortably in no respiratory distress PSYCH: Normally interactive.   Right great toe with some tenderness to palpate along the medial border of the great toe.  There is no redness.  There is no pus.  It is not warm at all.  Laboratory and Imaging Data:  Assessment and Plan:     ICD-10-CM   1. Ingrown toenail of right foot  L60.0      Very early stages of an ingrown toenail likely exacerbated by work.  Showed him out to put some traction on the medial toe adjacent to the nail and also used a small piece of cotton or dental  floss to slightly elevate.  No sign of infection.  Dragon [employer] One speech-to-text software was used for transcription in this dictation.  Possible transcriptional errors can occur using Editor, commissioning.   Signed,  Maud Deed. Stevie Charter, MD   Outpatient Encounter Medications as of 04/12/2021  Medication Sig   Alcohol Swabs 70 % PADS Use as directed to check sugar. E11.9.   benazepril (LOTENSIN) 20 MG tablet Take 1 tablet (20 mg total) by mouth daily.   Choline Fenofibrate (FENOFIBRIC ACID) 135 MG CPDR TAKE 1 CAPSULE BY MOUTH EVERY DAY   folic acid (FOLVITE) 1 MG tablet Take 1 tablet (1 mg total) by mouth daily.   glucose blood test strip USE DAILY TO CHECK SUGAR   Lancets (ONETOUCH ULTRASOFT) lancets Use as instructed to check sugar daily if needed.  E11.9.   metFORMIN (GLUCOPHAGE) 500 MG tablet TAKE 1-2 TABLETS BY MOUTH IN THE MORNING AND 1-2 IN THE EVENING   metoprolol tartrate (LOPRESSOR) 50 MG tablet Take 1 tablet (50 mg total) by mouth 2 (two) times daily.   pravastatin (PRAVACHOL) 20 MG tablet Take 1 tablet (20 mg total) by mouth daily.   Soft Lens Products (REFRESH CONTACTS DROPS) SOLN Place 1 drop into both eyes daily.  No facility-administered encounter medications on file as of 04/12/2021.

## 2021-05-12 ENCOUNTER — Other Ambulatory Visit: Payer: Self-pay | Admitting: Family Medicine

## 2021-07-16 ENCOUNTER — Other Ambulatory Visit: Payer: Self-pay | Admitting: Family Medicine

## 2021-07-31 ENCOUNTER — Ambulatory Visit: Payer: Federal, State, Local not specified - PPO | Admitting: Family Medicine

## 2021-07-31 ENCOUNTER — Encounter: Payer: Self-pay | Admitting: Family Medicine

## 2021-07-31 VITALS — BP 122/80 | HR 61 | Temp 97.2°F | Ht 68.0 in | Wt 179.0 lb

## 2021-07-31 DIAGNOSIS — E119 Type 2 diabetes mellitus without complications: Secondary | ICD-10-CM | POA: Diagnosis not present

## 2021-07-31 DIAGNOSIS — E782 Mixed hyperlipidemia: Secondary | ICD-10-CM

## 2021-07-31 DIAGNOSIS — Z0000 Encounter for general adult medical examination without abnormal findings: Secondary | ICD-10-CM

## 2021-07-31 DIAGNOSIS — Z7189 Other specified counseling: Secondary | ICD-10-CM

## 2021-07-31 DIAGNOSIS — I10 Essential (primary) hypertension: Secondary | ICD-10-CM

## 2021-07-31 LAB — COMPREHENSIVE METABOLIC PANEL
ALT: 26 U/L (ref 0–53)
AST: 27 U/L (ref 0–37)
Albumin: 4.3 g/dL (ref 3.5–5.2)
Alkaline Phosphatase: 41 U/L (ref 39–117)
BUN: 20 mg/dL (ref 6–23)
CO2: 26 mEq/L (ref 19–32)
Calcium: 9.8 mg/dL (ref 8.4–10.5)
Chloride: 101 mEq/L (ref 96–112)
Creatinine, Ser: 0.95 mg/dL (ref 0.40–1.50)
GFR: 80.08 mL/min (ref >60.00)
Glucose, Bld: 143 mg/dL — ABNORMAL HIGH (ref 70–99)
Potassium: 3.8 mEq/L (ref 3.5–5.1)
Sodium: 136 mEq/L (ref 135–145)
Total Bilirubin: 0.7 mg/dL (ref 0.2–1.2)
Total Protein: 7.7 g/dL (ref 6.0–8.3)

## 2021-07-31 LAB — LIPID PANEL
Cholesterol: 196 mg/dL (ref 0–200)
HDL: 40.2 mg/dL (ref >39.00)
LDL Cholesterol: 122 mg/dL — ABNORMAL HIGH (ref 0–99)
NonHDL: 155.43
Total CHOL/HDL Ratio: 5
Triglycerides: 169 mg/dL — ABNORMAL HIGH (ref 0.0–149.0)
VLDL: 33.8 mg/dL (ref 0.0–40.0)

## 2021-07-31 LAB — HEMOGLOBIN A1C: Hgb A1c MFr Bld: 7.1 % — ABNORMAL HIGH (ref 4.6–6.5)

## 2021-07-31 MED ORDER — METOPROLOL TARTRATE 50 MG PO TABS: 50.0000 mg | ORAL_TABLET | Freq: Two times a day (BID) | ORAL | 3 refills | Status: DC

## 2021-07-31 MED ORDER — FOLIC ACID 1 MG PO TABS: 1.0000 mg | ORAL_TABLET | Freq: Every day | ORAL | 3 refills | Status: DC

## 2021-07-31 MED ORDER — BENAZEPRIL HCL 20 MG PO TABS: 20.0000 mg | ORAL_TABLET | Freq: Every day | ORAL | 3 refills | Status: DC

## 2021-07-31 MED ORDER — PRAVASTATIN SODIUM 20 MG PO TABS: 20.0000 mg | ORAL_TABLET | Freq: Every day | ORAL | 3 refills | Status: DC

## 2021-07-31 NOTE — Patient Instructions (Signed)
Please ask the front for a record release from [facility] clinic.  ?Go to the lab on the way out.   If you have mychart we'll likely use that to update you.    ?Take care.  Glad to see you. ?Recheck A1c in about 6 months at a visit.   ?

## 2021-07-31 NOTE — Progress Notes (Signed)
CPE- See plan.  Routine anticipatory guidance given to patient.  See health maintenance.  The possibility exists that previously documented standard health maintenance information may have been brought forward from a previous encounter into this note.  If needed, that same information has been updated to reflect the current situation based on today's encounter.   ? ?covid vaccine prev done.   ?Tetanus 2009, d/w pt.   ?shingrix 2020 ?Flu prev done.   ?PNA up to date.  ?Colonoscopy 2022 ?Prostate cancer screening and PSA options (with potential risks and benefits of testing vs not testing) were discussed along with recent recs/guidelines.  He declined testing PSA at this point. ?Diet and exercise d/w pt.  Exercise- walking at work.  Diet is good.    ?Living will d/w pt.  Wife designated if patient were incapacitated.  ? ? ?Diabetes:  ?Using medications without difficulties: he has been taking extra metformin if inc sugar.  Max 4 pills in a day.   ?Hypoglycemic episodes: no ?Hyperglycemic episodes:no ?Feet problems:no ?Blood Sugars averaging: ~120s ?eye exam within last year: done 01/2021.   ?Labs pending.  ? ?Hypertension:    ?Using medication without problems or lightheadedness:  yes ?Chest pain with exertion:no ?Edema:no ?Short of breath:no ?Walking 1 mile a day.   ? ?Elevated Cholesterol: ?Using medications without problems: yes ?Muscle aches: no ?Diet compliance: yes ?Exercise: yes ? ?Still working at the [employer].   ? ?PMH and SH reviewed ? ?Meds, vitals, and allergies reviewed.  ? ?ROS: Per HPI.  Unless specifically indicated otherwise in HPI, the patient denies: ? ?General: fever. ?Eyes: acute vision changes ?ENT: sore throat ?Cardiovascular: chest pain ?Respiratory: SOB ?GI: vomiting ?GU: dysuria ?Musculoskeletal: acute back pain ?Derm: acute rash ?Neuro: acute motor dysfunction ?Psych: worsening mood ?Endocrine: polydipsia ?Heme: bleeding ?Allergy: hayfever ? ?GEN: nad, alert and oriented ?HEENT:  ncat ?NECK: supple w/o LA ?CV: rrr. ?PULM: ctab, no inc wob ?ABD: soft, +bs ?EXT: no edema ?SKIN: no acute rash ? ?Diabetic foot exam: ?Normal inspection ?No skin breakdown ?No calluses  ?Normal DP pulses ?Normal sensation to light touch and monofilament ?Nails normal ?

## 2021-08-02 NOTE — Assessment & Plan Note (Addendum)
Continue pravastatin and fenofibrate. ?See notes on labs.  Continue work on diet and exercise. ?

## 2021-08-02 NOTE — Assessment & Plan Note (Signed)
Continue metformin.  See notes on labs.  Continue work on diet and exercise. 

## 2021-08-02 NOTE — Assessment & Plan Note (Signed)
Continue benazepril and metoprolol.  See notes on labs.  Continue work on diet and exercise. ?

## 2021-08-02 NOTE — Assessment & Plan Note (Signed)
Living will d/w pt.  Wife designated if patient were incapacitated.   ?

## 2021-08-02 NOTE — Assessment & Plan Note (Signed)
covid vaccine prev done.   ?Tetanus 2009, d/w pt.   ?shingrix 2020 ?Flu prev done.   ?PNA up to date.  ?Colonoscopy 2022 ?Prostate cancer screening and PSA options (with potential risks and benefits of testing vs not testing) were discussed along with recent recs/guidelines.  He declined testing PSA at this point. ?Diet and exercise d/w pt.  Exercise- walking at work.  Diet is good.    ?Living will d/w pt.  Wife designated if patient were incapacitated.  ?

## 2021-09-15 ENCOUNTER — Other Ambulatory Visit: Payer: Self-pay

## 2021-09-15 ENCOUNTER — Encounter (HOSPITAL_COMMUNITY): Payer: Self-pay

## 2021-09-15 ENCOUNTER — Emergency Department (HOSPITAL_COMMUNITY): Payer: Federal, State, Local not specified - PPO

## 2021-09-15 ENCOUNTER — Observation Stay (HOSPITAL_COMMUNITY)
Admission: EM | Admit: 2021-09-15 | Discharge: 2021-09-16 | Disposition: A | Discharge status: Home / Self Care | Payer: Federal, State, Local not specified - PPO | Attending: Family Medicine | Admitting: Family Medicine

## 2021-09-15 ENCOUNTER — Other Ambulatory Visit (HOSPITAL_COMMUNITY): Payer: Federal, State, Local not specified - PPO

## 2021-09-15 ENCOUNTER — Inpatient Hospital Stay (HOSPITAL_COMMUNITY): Payer: Federal, State, Local not specified - PPO

## 2021-09-15 DIAGNOSIS — Z87891 Personal history of nicotine dependence: Secondary | ICD-10-CM | POA: Insufficient documentation

## 2021-09-15 DIAGNOSIS — M4804 Spinal stenosis, thoracic region: Secondary | ICD-10-CM | POA: Insufficient documentation

## 2021-09-15 DIAGNOSIS — Z79899 Other long term (current) drug therapy: Secondary | ICD-10-CM | POA: Diagnosis not present

## 2021-09-15 DIAGNOSIS — E782 Mixed hyperlipidemia: Secondary | ICD-10-CM

## 2021-09-15 DIAGNOSIS — M4800 Spinal stenosis, site unspecified: Secondary | ICD-10-CM

## 2021-09-15 DIAGNOSIS — R220 Localized swelling, mass and lump, head: Secondary | ICD-10-CM

## 2021-09-15 DIAGNOSIS — Z8673 Personal history of transient ischemic attack (TIA), and cerebral infarction without residual deficits: Secondary | ICD-10-CM | POA: Diagnosis present

## 2021-09-15 DIAGNOSIS — I10 Essential (primary) hypertension: Secondary | ICD-10-CM | POA: Diagnosis not present

## 2021-09-15 DIAGNOSIS — I639 Cerebral infarction, unspecified: Secondary | ICD-10-CM | POA: Diagnosis not present

## 2021-09-15 DIAGNOSIS — Z7984 Long term (current) use of oral hypoglycemic drugs: Secondary | ICD-10-CM | POA: Diagnosis not present

## 2021-09-15 DIAGNOSIS — E119 Type 2 diabetes mellitus without complications: Secondary | ICD-10-CM | POA: Insufficient documentation

## 2021-09-15 DIAGNOSIS — I441 Atrioventricular block, second degree: Secondary | ICD-10-CM | POA: Diagnosis not present

## 2021-09-15 DIAGNOSIS — Z85828 Personal history of other malignant neoplasm of skin: Secondary | ICD-10-CM | POA: Diagnosis not present

## 2021-09-15 DIAGNOSIS — R200 Anesthesia of skin: Secondary | ICD-10-CM | POA: Diagnosis present

## 2021-09-15 DIAGNOSIS — R202 Paresthesia of skin: Principal | ICD-10-CM

## 2021-09-15 LAB — CBC WITH DIFFERENTIAL/PLATELET
Abs Immature Granulocytes: 0.03 10*3/uL (ref 0.00–0.07)
Basophils Absolute: 0 10*3/uL (ref 0.0–0.1)
Basophils Relative: 1 %
Eosinophils Absolute: 0.1 10*3/uL (ref 0.0–0.5)
Eosinophils Relative: 2 %
HCT: 42.3 % (ref 39.0–52.0)
Hemoglobin: 14.5 g/dL (ref 13.0–17.0)
Immature Granulocytes: 1 %
Lymphocytes Relative: 21 %
Lymphs Abs: 1.4 10*3/uL (ref 0.7–4.0)
MCH: 31.9 pg (ref 26.0–34.0)
MCHC: 34.3 g/dL (ref 30.0–36.0)
MCV: 93.2 fL (ref 80.0–100.0)
Monocytes Absolute: 0.5 10*3/uL (ref 0.1–1.0)
Monocytes Relative: 8 %
Neutro Abs: 4.6 10*3/uL (ref 1.7–7.7)
Neutrophils Relative %: 67 %
Platelets: 223 10*3/uL (ref 150–400)
RBC: 4.54 MIL/uL (ref 4.22–5.81)
RDW: 13.2 % (ref 11.5–15.5)
WBC: 6.6 10*3/uL (ref 4.0–10.5)
nRBC: 0 % (ref 0.0–0.2)

## 2021-09-15 LAB — COMPREHENSIVE METABOLIC PANEL
ALT: 27 U/L (ref 0–44)
AST: 28 U/L (ref 15–41)
Albumin: 3.7 g/dL (ref 3.5–5.0)
Alkaline Phosphatase: 36 U/L — ABNORMAL LOW (ref 38–126)
Anion gap: 10 (ref 5–15)
BUN: 22 mg/dL (ref 8–23)
CO2: 20 mmol/L — ABNORMAL LOW (ref 22–32)
Calcium: 9.3 mg/dL (ref 8.9–10.3)
Chloride: 106 mmol/L (ref 98–111)
Creatinine, Ser: 1.06 mg/dL (ref 0.61–1.24)
GFR, Estimated: >60 mL/min (ref >60)
Glucose, Bld: 228 mg/dL — ABNORMAL HIGH (ref 70–99)
Potassium: 4.1 mmol/L (ref 3.5–5.1)
Sodium: 136 mmol/L (ref 135–145)
Total Bilirubin: 0.8 mg/dL (ref 0.3–1.2)
Total Protein: 7.1 g/dL (ref 6.5–8.1)

## 2021-09-15 LAB — GLUCOSE, CAPILLARY
Glucose-Capillary: 202 mg/dL — ABNORMAL HIGH (ref 70–99)
Glucose-Capillary: 179 mg/dL — ABNORMAL HIGH (ref 70–99)

## 2021-09-15 LAB — TROPONIN I (HIGH SENSITIVITY)
Troponin I (High Sensitivity): 8 ng/L (ref <18)
Troponin I (High Sensitivity): 9 ng/L (ref <18)

## 2021-09-15 MED ORDER — INSULIN ASPART 100 UNIT/ML IJ SOLN
0.0000 [IU] | Freq: Three times a day (TID) | INTRAMUSCULAR | Status: DC
  Administered 2021-09-15 – 2021-09-16 (×2): 3 [IU] via SUBCUTANEOUS
  Administered 2021-09-16: 2 [IU] via SUBCUTANEOUS

## 2021-09-15 MED ORDER — FOLIC ACID 1 MG PO TABS
1.0000 mg | ORAL_TABLET | Freq: Every day | ORAL | Status: DC
  Administered 2021-09-15 – 2021-09-16 (×2): 1 mg via ORAL
  Filled 2021-09-15 (×2): qty 1

## 2021-09-15 MED ORDER — ACETAMINOPHEN 160 MG/5ML PO SOLN: 650.0000 mg | ORAL | Status: DC | PRN

## 2021-09-15 MED ORDER — FENOFIBRATE 160 MG PO TABS
160.0000 mg | ORAL_TABLET | Freq: Every day | ORAL | Status: DC
  Administered 2021-09-15 – 2021-09-16 (×2): 160 mg via ORAL
  Filled 2021-09-15 (×2): qty 1

## 2021-09-15 MED ORDER — ASPIRIN 81 MG PO CHEW
81.0000 mg | CHEWABLE_TABLET | Freq: Every day | ORAL | Status: DC
  Administered 2021-09-16: 81 mg via ORAL
  Filled 2021-09-15: qty 1

## 2021-09-15 MED ORDER — ACETAMINOPHEN 325 MG PO TABS: 650.0000 mg | ORAL_TABLET | ORAL | Status: DC | PRN

## 2021-09-15 MED ORDER — SENNOSIDES-DOCUSATE SODIUM 8.6-50 MG PO TABS: 1.0000 | ORAL_TABLET | Freq: Every evening | ORAL | Status: DC | PRN

## 2021-09-15 MED ORDER — STROKE: EARLY STAGES OF RECOVERY BOOK
Freq: Once | Status: AC
  Filled 2021-09-15 (×2): qty 1

## 2021-09-15 MED ORDER — ACETAMINOPHEN 650 MG RE SUPP
650.0000 mg | RECTAL | Status: DC | PRN
Start: 2021-09-15 — End: 2021-09-16

## 2021-09-15 MED ORDER — IOHEXOL 350 MG/ML SOLN
50.0000 mL | Freq: Once | INTRAVENOUS | Status: AC | PRN
Start: 2021-09-15 — End: 2021-09-15
  Administered 2021-09-15: 50 mL via INTRAVENOUS

## 2021-09-15 MED ORDER — ROSUVASTATIN CALCIUM 20 MG PO TABS
20.0000 mg | ORAL_TABLET | Freq: Every day | ORAL | Status: DC
  Administered 2021-09-15: 20 mg via ORAL
  Filled 2021-09-15: qty 1

## 2021-09-15 NOTE — Assessment & Plan Note (Signed)
Allow for permissive HTN in setting of acute CVA in first 24-48 hours IV parameters with hydralazine prn  Hold beta blocker with mobitz type 2 regardless

## 2021-09-15 NOTE — Assessment & Plan Note (Signed)
A1C of 7.1 in 07/2021. Goal A1C <6.5 in setting of acute CVA  Hold metformin SSI and accuchecks per protocol.

## 2021-09-15 NOTE — Consult Note (Addendum)
Cardiology Consultation:   Patient ID: ULES MARSALA MRN: [id number]; DOB: 1950/12/28  Admit date: 09/15/2021 Date of Consult: 09/15/2021  PCP:  Tonia Ghent, MD   [facility] Providers Cardiologist:  New to [facility] Click here to update MD or APP on Care Team, Refresh:1}     Patient Profile:   [name] is a 71 y.o. male with a hx of DM, HTN, hyperlipidemia, RBBB by prior EKG, remote OSA tx with nasal surgery who is being seen 09/15/2021 for the evaluation of second degree AVB at the request of Dr. Roderic Palau.  History of Present Illness:   Mr. Kurek has no prior cardiac history aside from incidental RBBB known from prior tracings. He is active and walks every other day for activity and also still works at the [employer] on Wednesdays. He has been in [place] lately without any complaints or cardiac symptoms. He went to bed last night as normal around 10pm. He woke up at 6:30am and noticed his left side was tingling - left arm, left leg. No speech deficits, facial asymmetry, focal weakness, or visual difficulties. He had not had any recent chest pain, palpitations, dyspnea, dizziness, or weakness. He was hypertensive on arrival. CXR NAD. MRI of the brain showed acute right thalamic infarct. MR T&C spine showed C5-C7 ACDF without residual spinal stenosis, progressive disc disc degeneration at C3-4 and C4-5 with moderate spinal stenosis and moderate to severe neural foraminal stenosis, and 1.6 cm lesion posterior to the left submandibular gland, indeterminate for a cyst, small venolymphatic malformation, or other mass. He is being admitted for stroke. Cardiology is consulted for bradyarrhythmia - Initial EKG showed bradycardia in the form of sinus at 41bpm with Mobitz 1 and intermittent 2:1 AV block, with follow-up tracing showing NSR 81bpm with possible intermittent Mobitz 2. He is on metoprolol '50mg'$  BID at home as part of HTN management, last dose was last night. He is awaiting CT  angio head/neck and neurology consultation and has no acute complaints. HR 40s-80s on telemetry.  Father had PPM in his 49s.  Past Medical History:  Diagnosis Date   DDD (degenerative disc disease), cervical    Diabetes mellitus without complication ([place])    History of chicken pox    History of skin cancer    Dr.Drew Ronnald Ramp   Hx of adenomatous and sessile serrated colonic polyps 03/21/2017   Hyperlipidemia    [place] Study LDL goal = <130   Hypertension    Metabolic syndrome    Rosacea    Sleep apnea    does not use CPAP machine; treated with prev nasal surgery    Past Surgical History:  Procedure Laterality Date   CERVICAL FUSION     COLONOSCOPY  04/23/2004   Neg   COLONOSCOPY  2018   Dr.Gessner   LUMBAR LAMINECTOMY/DECOMPRESSION MICRODISCECTOMY  05/09/2011   Procedure: LUMBAR LAMINECTOMY/DECOMPRESSION MICRODISCECTOMY;  Surgeon: Magnus Sinning, MD;  Location: [facility];  Service: Orthopedics;  Laterality: Left;  Decompressive laminectomy L2-3,  L3- 4,  L4-5 left, central    NASAL SINUS SURGERY       Home Medications:  Prior to Admission medications   Medication Sig Start Date End Date Taking? Authorizing Provider  benazepril (LOTENSIN) 20 MG tablet Take 1 tablet (20 mg total) by mouth daily. 07/31/21  Yes Tonia Ghent, MD  Choline Fenofibrate (FENOFIBRIC ACID) 135 MG CPDR TAKE 1 CAPSULE BY MOUTH EVERY DAY Patient taking differently: Take 135 mg by mouth daily. 05/12/21  Yes  Tonia Ghent, MD  folic acid (FOLVITE) 1 MG tablet Take 1 tablet (1 mg total) by mouth daily. 07/31/21  Yes Tonia Ghent, MD  hydrocortisone 2.5 % lotion Apply 1 application. topically 2 (two) times daily. Apply to face   Yes [provider]  metFORMIN (GLUCOPHAGE) 500 MG tablet TAKE 1-2 TABLETS BY MOUTH IN THE MORNING AND 1-2 IN THE EVENING Patient taking differently: Take 500 mg by mouth 2 (two) times daily with a meal. 07/17/21  Yes Tonia Ghent, MD  metoprolol tartrate  (LOPRESSOR) 50 MG tablet Take 1 tablet (50 mg total) by mouth 2 (two) times daily. 07/31/21  Yes Tonia Ghent, MD  pravastatin (PRAVACHOL) 20 MG tablet Take 1 tablet (20 mg total) by mouth daily. 07/31/21  Yes Tonia Ghent, MD  Alcohol Swabs 70 % PADS Use as directed to check sugar. E11.9. 01/27/21   Tonia Ghent, MD  glucose blood test strip USE DAILY TO CHECK SUGAR 06/12/18   Tonia Ghent, MD  Lancets [facility] ULTRASOFT) lancets Use as instructed to check sugar daily if needed.  E11.9. 01/27/21   Tonia Ghent, MD  Soft Lens Products (REFRESH CONTACTS DROPS) SOLN Place 1 drop into both eyes daily.    [provider]    Inpatient Medications: Scheduled Meds:   stroke: early stages of recovery book   Does not apply Once   [START ON 09/16/2021] aspirin  81 mg Oral Daily   fenofibrate  160 mg Oral Daily   folic acid  1 mg Oral Daily   insulin aspart  0-9 Units Subcutaneous TID WC   rosuvastatin  20 mg Oral QHS   Continuous Infusions:  PRN Meds: acetaminophen **OR** acetaminophen (TYLENOL) oral liquid 160 mg/5 mL **OR** acetaminophen, senna-docusate  Allergies:   No Known Allergies  Social History:   Social History   Socioeconomic History   Marital status: Married    Spouse name: Not on file   Number of children: Not on file   Years of education: Not on file   Highest education level: Not on file  Occupational History   Occupation: Comptroller  Tobacco Use   Smoking status: Former    Types: Cigarettes    Quit date: 04/24/1971    Years since quitting: 50.4   Smokeless tobacco: Never  Vaping Use   Vaping Use: Never used  Substance and Sexual Activity   Alcohol use: No   Drug use: No   Sexual activity: Not on file  Other Topics Concern   Not on file  Social History Narrative   Retired, from post office   Married, 1973   2 sons (1 is local, 1 in Corporate treasurer reserve with prev deployments to [place])   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     Working  part time at [employer].     Social Determinants of Health   Financial Resource Strain: Not on file  Food Insecurity: Not on file  Transportation Needs: Not on file  Physical Activity: Not on file  Stress: Not on file  Social Connections: Not on file  Intimate Partner Violence: Not on file    Family History:    Family History  Problem Relation Age of Onset   Alzheimer's disease Mother    Heart disease Father        Pacemaker   Heart disease Other    Colon cancer Neg Hx    Prostate cancer Neg Hx  Stomach cancer Neg Hx    Colon polyps Neg Hx    Esophageal cancer Neg Hx    Rectal cancer Neg Hx      ROS:  Please see the history of present illness.  All other ROS reviewed and negative.     Physical Exam/Data:   Vitals:   09/15/21 1030 09/15/21 1100 09/15/21 1200 09/15/21 1444  BP: (!) 175/70 (!) 183/83 (!) 179/87 (!) 157/83  Pulse: 80 65 (!) 42 77  Resp: '12 15 17 14  '$ Temp:      TempSrc:      SpO2: 99% 99% 97% 100%  Weight:      Height:       No intake or output data in the 24 hours ending 09/15/21 1715    09/15/2021    8:18 AM 07/31/2021    8:51 AM 04/12/2021    2:12 PM  Last 3 Weights  Weight (lbs) 181 lb 179 lb 184 lb  Weight (kg) 82.101 kg 81.194 kg 83.462 kg     Body mass index is 27.52 kg/m.  General: Well developed, well nourished, in no acute distress. Head: Normocephalic, atraumatic, sclera non-icteric, no xanthomas, nares are without discharge. Neck: Negative for carotid bruits. JVP not elevated. Lungs: Clear bilaterally to auscultation without wheezes, rales, or rhonchi. Breathing is unlabored. Heart: irregular, bradycardic at times (HR 40s-60s), without murmurs, rubs, or gallops.  Abdomen: Soft, non-tender, non-distended with normoactive bowel sounds. No rebound/guarding. Extremities: No clubbing or cyanosis. No edema. Distal pedal pulses are 2+ and equal bilaterally. Neuro: Alert and oriented X 3. Moves all extremities spontaneously. Psych:   Responds to questions appropriately with a normal affect.  EKG:  The EKG was personally reviewed and demonstrates:   1st EKG: Bradycardia (sinus) 41bpm withwith RBBB, 2nd degree AVB type 1 with intermittent 2:1 AVB  2nd EKG: NSR 81bpm with RBBB with intermittent possible 2nd degree AV block type 2  Telemetry:  Telemetry was personally reviewed and demonstrates:  predominately NSR, also with intermittent 2nd degree AVB type 1 but cannot exclude rare episodes of 2nd degree AVB type 2 as well with intermittent 2:1 conduction  Relevant CV Studies: None  Laboratory Data:  High Sensitivity Troponin:   Recent Labs  Lab 09/15/21 0851 09/15/21 1113  TROPONINIHS 8 9     Chemistry Recent Labs  Lab 09/15/21 0851  NA 136  K 4.1  CL 106  CO2 20*  GLUCOSE 228*  BUN 22  CREATININE 1.06  CALCIUM 9.3  GFRNONAA >60  ANIONGAP 10    Recent Labs  Lab 09/15/21 0851  PROT 7.1  ALBUMIN 3.7  AST 28  ALT 27  ALKPHOS 36*  BILITOT 0.8   Lipids No results for input(s): CHOL, TRIG, HDL, LABVLDL, LDLCALC, CHOLHDL in the last 168 hours.  Hematology Recent Labs  Lab 09/15/21 0851  WBC 6.6  RBC 4.54  HGB 14.5  HCT 42.3  MCV 93.2  MCH 31.9  MCHC 34.3  RDW 13.2  PLT 223   Thyroid No results for input(s): TSH, FREET4 in the last 168 hours.  BNPNo results for input(s): BNP, PROBNP in the last 168 hours.  DDimer No results for input(s): DDIMER in the last 168 hours.   Radiology/Studies:  MR BRAIN WO CONTRAST  Result Date: 09/15/2021 CLINICAL DATA:  Neuro deficit, acute, stroke suspected. Left-sided tingling. EXAM: MRI HEAD WITHOUT CONTRAST TECHNIQUE: Multiplanar, multiecho pulse sequences of the brain and surrounding structures were obtained without intravenous contrast. COMPARISON:  None Available. FINDINGS:  Brain: There is a 7 mm acute infarct involving the lateral, inferior aspect of the right thalamus. No intracranial hemorrhage, mass, midline shift, or extra-axial fluid collection  is identified. No significant chronic white matter disease is evident. There is mild generalized cerebral atrophy. Vascular: Major intracranial vascular flow voids are preserved. Skull and upper cervical spine: Unremarkable bone marrow signal. Sinuses/Orbits: Unremarkable orbits. Trace fluid in the right sphenoid sinus. Clear mastoid air cells. Other: None. IMPRESSION: Acute right thalamic infarct. Electronically Signed   By: Logan Bores M.D.   On: 09/15/2021 14:13   MR Cervical Spine Wo Contrast  Addendum Date: 09/15/2021   ADDENDUM REPORT: 09/15/2021 14:34 ADDENDUM: 1.6 cm lesion posterior to the left submandibular gland, indeterminate for a cyst, small venolymphatic malformation, or other mass. Correlate with physical examination and consider contrast-enhanced neck CT for further evaluation. Electronically Signed   By: Logan Bores M.D.   On: 09/15/2021 14:34   Result Date: 09/15/2021 CLINICAL DATA:  Ataxia, nontraumatic, cervical pathology suspected. Left-sided tingling. EXAM: MRI CERVICAL SPINE WITHOUT CONTRAST TECHNIQUE: Multiplanar, multisequence MR imaging of the cervical spine was performed. No intravenous contrast was administered. COMPARISON:  Cervical spine MRI 11/18/2008 FINDINGS: Alignment: Chronic straightening of the normal cervical lordosis. No significant listhesis. Vertebrae: No fracture, suspicious marrow lesion, or significant marrow edema. Interval C5-C7 ACDF with evidence of solid arthrodesis. Cord: Normal signal. Posterior Fossa, vertebral arteries, paraspinal tissues: 1.6 cm homogeneously T2 hyperintense lesion posterior to the left submandibular gland. Preserved vertebral artery flow voids. Disc levels: C2-3: Small central disc protrusion, less prominent than on the prior study and without associated spinal stenosis. Mild uncovertebral spurring and facet arthrosis without significant neural foraminal stenosis. C3-4: Mild disc space narrowing. Disc bulging, uncovertebral spurring, and  moderate right and mild left facet arthrosis result in moderate spinal stenosis and moderate to severe right and mild-to-moderate left neural foraminal stenosis, progressed from prior. C4-5: Mild disc space narrowing. Disc bulging, a central/left central disc protrusion, uncovertebral spurring, and mild facet arthrosis result in moderate spinal stenosis with mild cord flattening and moderate to severe bilateral neural foraminal stenosis, progressed from prior. C5-6: Interval ACDF. Patent spinal canal. Mild-to-moderate bilateral neural foraminal stenosis due to uncovertebral spurring. C6-7: Interval ACDF. Patent spinal canal. Mild-to-moderate bilateral neural foraminal stenosis due to uncovertebral spurring. C7-T1: Minimal disc bulging and uncovertebral spurring without stenosis. IMPRESSION: 1. C5-C7 ACDF without residual spinal stenosis. 2. Progressive disc degeneration at C3-4 and C4-5 with moderate spinal stenosis and moderate to severe neural foraminal stenosis. Electronically Signed: By: Logan Bores M.D. On: 09/15/2021 14:26   MR THORACIC SPINE WO CONTRAST  Result Date: 09/15/2021 CLINICAL DATA:  Ataxia, nontraumatic, thoracic pathology suspected. Left-sided tingling. EXAM: MRI THORACIC SPINE WITHOUT CONTRAST TECHNIQUE: Multiplanar, multisequence MR imaging of the thoracic spine was performed. No intravenous contrast was administered. COMPARISON:  None Available. FINDINGS: The study is motion degraded, including moderate motion on the axial T2 spin echo sequence. Alignment: Straightening of the normal thoracic kyphosis. No listhesis. Vertebrae: No fracture or suspicious marrow lesion. Degenerative endplate changes at D78-24 including minimal degenerative edema. Cord: No cord signal abnormality identified within limitations of motion artifact. Paraspinal and other soft tissues: Unremarkable. Disc levels: T1-2 and T2-3: Negative. T3-4: Disc bulging without significant stenosis. T4-5: Small left paracentral  disc protrusion and mild facet arthrosis without significant stenosis. T5-6: Disc bulging and moderate facet arthrosis result in mild bilateral neural foraminal stenosis without significant spinal stenosis. T6-7: Small right paracentral disc protrusion without significant stenosis. T7-8: A right  central disc protrusion result in moderate spinal stenosis with mild cord flattening. Mild facet arthrosis. No significant neural foraminal stenosis. T8-9: Disc bulging and a central disc protrusion result in mild spinal stenosis without significant neural foraminal stenosis. T9-10: Disc bulging and severe facet arthrosis result in moderate right greater than left neural foraminal stenosis without spinal stenosis. T10-11: Right eccentric disc bulging and mild-to-moderate facet arthrosis result in mild spinal stenosis and moderate right neural foraminal stenosis. T11-12: Negative. T12-L1: A broad posterior disc protrusion results in borderline spinal stenosis without neural foraminal stenosis. IMPRESSION: 1. Motion degraded examination. 2. Moderate spinal stenosis at T7-8 due to a disc protrusion. 3. Mild spinal stenosis at T8-9 and T10-11. 4. Moderate neural foraminal stenosis bilaterally at T9-10 and on the right at T10-11. Electronically Signed   By: Logan Bores M.D.   On: 09/15/2021 14:33   DG Chest Port 1 View  Result Date: 09/15/2021 CLINICAL DATA:  A 71 year old male presents for evaluation of bradycardia. EXAM: PORTABLE CHEST 1 VIEW COMPARISON:  May 02, 2011 FINDINGS: EKG leads project over the chest. Cardiomediastinal contours and hilar structures are normal. Lungs are clear. No acute skeletal process on limited assessment. IMPRESSION: No acute cardiopulmonary disease. Electronically Signed   By: Zetta Bills M.D.   On: 09/15/2021 09:57     Assessment and Plan:   1. Acute stroke - manifested by subtle left arm/leg tingling - workup underway by primary team  2. 2nd degree AVB - initial EKG showed  bradycardia in the form of sinus 41bpm with Mobitz 1 and intermittent 2:1 AV block, with follow-up tracing showing NSR 81bpm with possible Mobitz 2 - findings are seen similarly on telemetry, with HR 40s-80s - he is completely asymptomatic presently and BP is preserved - recommend to stop metoprolol and follow closely on telemetry during beta blocker washout - agree with echo - check TSH - if arrhythmia persists despite cessation of beta blocker, will need to involve EP (may end up having them involved during admission regardless for recommendations from neurology forthcoming for monitor versus ILR depending on stroke workup)  3. Essential HTN - as above, hold metoprolol, otherwise will defer management to primary team/neuro  4. Hyperlipidemia - check lipid panel in AM and consider titration of meds (on pravastatin at home)   Risk Assessment/Risk Scores:       Patient seen and examined, note reviewed with the signed Advanced Practice Provider. I personally reviewed laboratory data, imaging studies and relevant notes. I independently examined the patient and formulated the important aspects of the plan. I have personally discussed the plan with the patient and/or family. Comments or changes to the note/plan are indicated below.  Patient seen and examined in the CT waiting area.  EKG review with second degree AVB Mobitz 1 and intermittent 2:1 AV block - currently on beta blocker. For now recommends holding the beta blocker. And monitor patient on tele.  Will fu echo results. In the setting of recent CVA will defer to primary suspect permissive hypertension.  Continue current statin- fu lipid    Berniece Salines DO, MS [facility] Attending [place]  [address] [place], [place] [address] [phone] Website: [email or web address]         For questions or updates, please contact [place] Please consult [email or web address] for contact info under     Signed, Charlie Pitter, PA-C  09/15/2021 5:15 PM

## 2021-09-15 NOTE — Assessment & Plan Note (Addendum)
Recent lipid panel with LDL of 122 On pravachol '20mg'$ >change to high intensity statin: crestor '20mg'$   Goal LDL <70

## 2021-09-15 NOTE — ED Provider Notes (Incomplete)
[facility] EMERGENCY DEPARTMENT Provider Note   CSN: [id number] Arrival date & time: 09/15/21  0815     History {Add pertinent medical, surgical, social history, OB history to HPI:1} Chief Complaint  Patient presents with   Tingling    [name] is a 71 y.o. male.  Patient has a history of hypertension and diabetes.  He stated he woke up today and had numbness in his left hand and left foot and was having mild difficulty ambulating with the left leg.  The history is provided by the patient and medical records.  Weakness     Home Medications Prior to Admission medications   Medication Sig Start Date End Date Taking? Authorizing Provider  benazepril (LOTENSIN) 20 MG tablet Take 1 tablet (20 mg total) by mouth daily. 07/31/21  Yes Tonia Ghent, MD  Choline Fenofibrate (FENOFIBRIC ACID) 135 MG CPDR TAKE 1 CAPSULE BY MOUTH EVERY DAY Patient taking differently: Take 135 mg by mouth daily. 05/12/21  Yes Tonia Ghent, MD  folic acid (FOLVITE) 1 MG tablet Take 1 tablet (1 mg total) by mouth daily. 07/31/21  Yes Tonia Ghent, MD  hydrocortisone 2.5 % lotion Apply 1 application. topically 2 (two) times daily. Apply to face   Yes [provider]  metFORMIN (GLUCOPHAGE) 500 MG tablet TAKE 1-2 TABLETS BY MOUTH IN THE MORNING AND 1-2 IN THE EVENING Patient taking differently: Take 500 mg by mouth 2 (two) times daily with a meal. 07/17/21  Yes Tonia Ghent, MD  metoprolol tartrate (LOPRESSOR) 50 MG tablet Take 1 tablet (50 mg total) by mouth 2 (two) times daily. 07/31/21  Yes Tonia Ghent, MD  pravastatin (PRAVACHOL) 20 MG tablet Take 1 tablet (20 mg total) by mouth daily. 07/31/21  Yes Tonia Ghent, MD  Alcohol Swabs 70 % PADS Use as directed to check sugar. E11.9. 01/27/21   Tonia Ghent, MD  glucose blood test strip USE DAILY TO CHECK SUGAR 06/12/18   Tonia Ghent, MD  Lancets [facility] ULTRASOFT) lancets Use as instructed to check  sugar daily if needed.  E11.9. 01/27/21   Tonia Ghent, MD  Soft Lens Products (REFRESH CONTACTS DROPS) SOLN Place 1 drop into both eyes daily.    [provider]      Allergies    Patient has no known allergies.    Review of Systems   Review of Systems  Neurological:  Positive for weakness.   Physical Exam Updated Vital Signs BP (!) 157/83   Pulse 77   Temp 99 F (37.2 C) (Oral)   Resp 14   Ht '5\' 8"'$  (1.727 m)   Wt 82.1 kg   SpO2 100%   BMI 27.52 kg/m  Physical Exam  ED Results / Procedures / Treatments   Labs (all labs ordered are listed, but only abnormal results are displayed) Labs Reviewed  COMPREHENSIVE METABOLIC PANEL - Abnormal; Notable for the following components:      Result Value   CO2 20 (*)    Glucose, Bld 228 (*)    Alkaline Phosphatase 36 (*)    All other components within normal limits  CBC WITH DIFFERENTIAL/PLATELET  TROPONIN I (HIGH SENSITIVITY)  TROPONIN I (HIGH SENSITIVITY)    EKG None  Radiology MR BRAIN WO CONTRAST  Result Date: 09/15/2021 CLINICAL DATA:  Neuro deficit, acute, stroke suspected. Left-sided tingling. EXAM: MRI HEAD WITHOUT CONTRAST TECHNIQUE: Multiplanar, multiecho pulse sequences of the brain and surrounding structures were obtained  without intravenous contrast. COMPARISON:  None Available. FINDINGS: Brain: There is a 7 mm acute infarct involving the lateral, inferior aspect of the right thalamus. No intracranial hemorrhage, mass, midline shift, or extra-axial fluid collection is identified. No significant chronic white matter disease is evident. There is mild generalized cerebral atrophy. Vascular: Major intracranial vascular flow voids are preserved. Skull and upper cervical spine: Unremarkable bone marrow signal. Sinuses/Orbits: Unremarkable orbits. Trace fluid in the right sphenoid sinus. Clear mastoid air cells. Other: None. IMPRESSION: Acute right thalamic infarct. Electronically Signed   By: Logan Bores M.D.   On:  09/15/2021 14:13   MR Cervical Spine Wo Contrast  Addendum Date: 09/15/2021   ADDENDUM REPORT: 09/15/2021 14:34 ADDENDUM: 1.6 cm lesion posterior to the left submandibular gland, indeterminate for a cyst, small venolymphatic malformation, or other mass. Correlate with physical examination and consider contrast-enhanced neck CT for further evaluation. Electronically Signed   By: Logan Bores M.D.   On: 09/15/2021 14:34   Result Date: 09/15/2021 CLINICAL DATA:  Ataxia, nontraumatic, cervical pathology suspected. Left-sided tingling. EXAM: MRI CERVICAL SPINE WITHOUT CONTRAST TECHNIQUE: Multiplanar, multisequence MR imaging of the cervical spine was performed. No intravenous contrast was administered. COMPARISON:  Cervical spine MRI 11/18/2008 FINDINGS: Alignment: Chronic straightening of the normal cervical lordosis. No significant listhesis. Vertebrae: No fracture, suspicious marrow lesion, or significant marrow edema. Interval C5-C7 ACDF with evidence of solid arthrodesis. Cord: Normal signal. Posterior Fossa, vertebral arteries, paraspinal tissues: 1.6 cm homogeneously T2 hyperintense lesion posterior to the left submandibular gland. Preserved vertebral artery flow voids. Disc levels: C2-3: Small central disc protrusion, less prominent than on the prior study and without associated spinal stenosis. Mild uncovertebral spurring and facet arthrosis without significant neural foraminal stenosis. C3-4: Mild disc space narrowing. Disc bulging, uncovertebral spurring, and moderate right and mild left facet arthrosis result in moderate spinal stenosis and moderate to severe right and mild-to-moderate left neural foraminal stenosis, progressed from prior. C4-5: Mild disc space narrowing. Disc bulging, a central/left central disc protrusion, uncovertebral spurring, and mild facet arthrosis result in moderate spinal stenosis with mild cord flattening and moderate to severe bilateral neural foraminal stenosis, progressed  from prior. C5-6: Interval ACDF. Patent spinal canal. Mild-to-moderate bilateral neural foraminal stenosis due to uncovertebral spurring. C6-7: Interval ACDF. Patent spinal canal. Mild-to-moderate bilateral neural foraminal stenosis due to uncovertebral spurring. C7-T1: Minimal disc bulging and uncovertebral spurring without stenosis. IMPRESSION: 1. C5-C7 ACDF without residual spinal stenosis. 2. Progressive disc degeneration at C3-4 and C4-5 with moderate spinal stenosis and moderate to severe neural foraminal stenosis. Electronically Signed: By: Logan Bores M.D. On: 09/15/2021 14:26   MR THORACIC SPINE WO CONTRAST  Result Date: 09/15/2021 CLINICAL DATA:  Ataxia, nontraumatic, thoracic pathology suspected. Left-sided tingling. EXAM: MRI THORACIC SPINE WITHOUT CONTRAST TECHNIQUE: Multiplanar, multisequence MR imaging of the thoracic spine was performed. No intravenous contrast was administered. COMPARISON:  None Available. FINDINGS: The study is motion degraded, including moderate motion on the axial T2 spin echo sequence. Alignment: Straightening of the normal thoracic kyphosis. No listhesis. Vertebrae: No fracture or suspicious marrow lesion. Degenerative endplate changes at V78-46 including minimal degenerative edema. Cord: No cord signal abnormality identified within limitations of motion artifact. Paraspinal and other soft tissues: Unremarkable. Disc levels: T1-2 and T2-3: Negative. T3-4: Disc bulging without significant stenosis. T4-5: Small left paracentral disc protrusion and mild facet arthrosis without significant stenosis. T5-6: Disc bulging and moderate facet arthrosis result in mild bilateral neural foraminal stenosis without significant spinal stenosis. T6-7: Small right paracentral disc  protrusion without significant stenosis. T7-8: A right central disc protrusion result in moderate spinal stenosis with mild cord flattening. Mild facet arthrosis. No significant neural foraminal stenosis. T8-9:  Disc bulging and a central disc protrusion result in mild spinal stenosis without significant neural foraminal stenosis. T9-10: Disc bulging and severe facet arthrosis result in moderate right greater than left neural foraminal stenosis without spinal stenosis. T10-11: Right eccentric disc bulging and mild-to-moderate facet arthrosis result in mild spinal stenosis and moderate right neural foraminal stenosis. T11-12: Negative. T12-L1: A broad posterior disc protrusion results in borderline spinal stenosis without neural foraminal stenosis. IMPRESSION: 1. Motion degraded examination. 2. Moderate spinal stenosis at T7-8 due to a disc protrusion. 3. Mild spinal stenosis at T8-9 and T10-11. 4. Moderate neural foraminal stenosis bilaterally at T9-10 and on the right at T10-11. Electronically Signed   By: Logan Bores M.D.   On: 09/15/2021 14:33   DG Chest Port 1 View  Result Date: 09/15/2021 CLINICAL DATA:  A 71 year old male presents for evaluation of bradycardia. EXAM: PORTABLE CHEST 1 VIEW COMPARISON:  May 02, 2011 FINDINGS: EKG leads project over the chest. Cardiomediastinal contours and hilar structures are normal. Lungs are clear. No acute skeletal process on limited assessment. IMPRESSION: No acute cardiopulmonary disease. Electronically Signed   By: Zetta Bills M.D.   On: 09/15/2021 09:57    Procedures Procedures  {Document cardiac monitor, telemetry assessment procedure when appropriate:1}  Medications Ordered in ED Medications  insulin aspart (novoLOG) injection 0-9 Units (has no administration in time range)    ED Course/ Medical Decision Making/ A&P  CRITICAL CARE Performed by: Milton Ferguson Total critical care time: 35 minutes Critical care time was exclusive of separately billable procedures and treating other patients. Critical care was necessary to treat or prevent imminent or life-threatening deterioration. Critical care was time spent personally by me on the following  activities: development of treatment plan with patient and/or surrogate as well as nursing, discussions with consultants, evaluation of patient's response to treatment, examination of patient, obtaining history from patient or surrogate, ordering and performing treatments and interventions, ordering and review of laboratory studies, ordering and review of radiographic studies, pulse oximetry and re-evaluation of patient's condition.                          Medical Decision Making Amount and/or Complexity of Data Reviewed Labs: ordered. Radiology: ordered.  Risk Decision regarding hospitalization.  Patient with a new thalamic stroke.  Patient also has Mobitz type II cardiac rhythm.  He will be admitted to medicine with neurology and cardiology consulting  {Document critical care time when appropriate:1} {Document review of labs and clinical decision tools ie heart score, Chads2Vasc2 etc:1}  {Document your independent review of radiology images, and any outside records:1} {Document your discussion with family members, caretakers, and with consultants:1} {Document social determinants of health affecting pt's care:1} {Document your decision making why or why not admission, treatments were needed:1} Final Clinical Impression(s) / ED Diagnoses Final diagnoses:  None    Rx / DC Orders ED Discharge Orders     None

## 2021-09-15 NOTE — Assessment & Plan Note (Signed)
New finding, but no ekg since 2013 Seems asymptomatic, stable Cardiology consulted in ED, keep on telemetry Hold beta blocker

## 2021-09-15 NOTE — Assessment & Plan Note (Addendum)
1.6 cm lesion posterior to the left submandibular gland, indeterminate for a cyst, venolymphatic malformation or other mass. Asymptomatic  -palpable on exam, non tender/mobile -may need dedicated neck CT inpatient vs. Outpatient

## 2021-09-15 NOTE — H&P (Signed)
History and Physical    Patient: [name] EXN:[id number] DOB: 12/11/1950 DOA: 09/15/2021 DOS: the patient was seen and examined on 09/15/2021 PCP: Tonia Ghent, MD  Patient coming from: Home - lives with his wife. Ambulates independently    Chief Complaint:  left sided tingling/numbness   HPI: [name] is a 71 y.o. male with medical history significant of T2DM, HLD, HTN who presented to ED with complaints of tingling in his hand and foot and felt funny walking this AM. He woke up at 6:30AM and had sudden onset of tingling in left hand/arm and left leg and then he had some tingling on the left side of his face. He still has the tingling now in his face/hand.  Leg still feels numb like. Denies any weakness. Denies any focal deficits or confusion. He felt funny walking due to tingling/numbness in his foot. He also took 4, '81mg'$  ASA at home.    He has been feeling good. Denies any fever/chills, vision changes/headaches, chest pain or palpitations, shortness of breath or cough, abdominal pain, N/V/D, dysuria or leg swelling. No pain in arms/legs.   He does not smoke or drink alcohol. No family history of CVA.   ER Course:  vitals: temp: 99, bp: 181/90, HR: 77, RR: 11, oxygen: 100%RA Pertinent labs: glucose: 228,  CXR: no acute finding MRI brain: acute right thalamic infarct -MR cervical spine: C5-C7 ACDF without residual spinal stenosis. 2. Progressive disc degeneration at C3-4 and C4-5 with moderate spinal stenosis and moderate to severe neural foraminal stenosis. Also has 1.65mlesion to the left submandibular gland, indeterminate cyst, small venolymphatic malformation, or other mass.  MR thoracic: Moderate spinal stenosis at T7-8 due to a disc protrusion. 3. Mild spinal stenosis at T8-9 and T10-11. 4. Moderate neural foraminal stenosis bilaterally at T9-10 and on the right at T10-11. In ED: neurology consulted.    Review of Systems: As mentioned in the history of  present illness. All other systems reviewed and are negative. Past Medical History:  Diagnosis Date   DDD (degenerative disc disease), cervical    Diabetes mellitus without complication ([facility]    History of chicken pox    History of skin cancer    Dr.Drew JRonnald Ramp  Hx of adenomatous and sessile serrated colonic polyps 03/21/2017   Hyperlipidemia    [place] Study LDL goal = <130   Hypertension    Metabolic syndrome    Rosacea    Sleep apnea    does not use CPAP machine; treated with prev nasal surgery   Past Surgical History:  Procedure Laterality Date   CERVICAL FUSION     COLONOSCOPY  04/23/2004   Neg   COLONOSCOPY  2018   Dr.Gessner   LUMBAR LAMINECTOMY/DECOMPRESSION MICRODISCECTOMY  05/09/2011   Procedure: LUMBAR LAMINECTOMY/DECOMPRESSION MICRODISCECTOMY;  Surgeon: JMagnus Sinning MD;  Location: [facility];  Service: Orthopedics;  Laterality: Left;  Decompressive laminectomy L2-3,  L3- 4,  L4-5 left, central    NASAL SINUS SURGERY     Social History:  reports that he quit smoking about 50 years ago. His smoking use included cigarettes. He has never used smokeless tobacco. He reports that he does not drink alcohol and does not use drugs.  No Known Allergies  Family History  Problem Relation Age of Onset   Alzheimer's disease Mother    Heart disease Father        Pacemaker   Heart disease Other    Colon cancer Neg Hx  Prostate cancer Neg Hx    Stomach cancer Neg Hx    Colon polyps Neg Hx    Esophageal cancer Neg Hx    Rectal cancer Neg Hx     Prior to Admission medications   Medication Sig Start Date End Date Taking? Authorizing Provider  benazepril (LOTENSIN) 20 MG tablet Take 1 tablet (20 mg total) by mouth daily. 07/31/21  Yes Tonia Ghent, MD  Choline Fenofibrate (FENOFIBRIC ACID) 135 MG CPDR TAKE 1 CAPSULE BY MOUTH EVERY DAY Patient taking differently: Take 135 mg by mouth daily. 05/12/21  Yes Tonia Ghent, MD  folic acid (FOLVITE) 1 MG tablet Take 1  tablet (1 mg total) by mouth daily. 07/31/21  Yes Tonia Ghent, MD  hydrocortisone 2.5 % lotion Apply 1 application. topically 2 (two) times daily. Apply to face   Yes [provider]  metFORMIN (GLUCOPHAGE) 500 MG tablet TAKE 1-2 TABLETS BY MOUTH IN THE MORNING AND 1-2 IN THE EVENING Patient taking differently: Take 500 mg by mouth 2 (two) times daily with a meal. 07/17/21  Yes Tonia Ghent, MD  metoprolol tartrate (LOPRESSOR) 50 MG tablet Take 1 tablet (50 mg total) by mouth 2 (two) times daily. 07/31/21  Yes Tonia Ghent, MD  pravastatin (PRAVACHOL) 20 MG tablet Take 1 tablet (20 mg total) by mouth daily. 07/31/21  Yes Tonia Ghent, MD  Alcohol Swabs 70 % PADS Use as directed to check sugar. E11.9. 01/27/21   Tonia Ghent, MD  glucose blood test strip USE DAILY TO CHECK SUGAR 06/12/18   Tonia Ghent, MD  Lancets [facility] ULTRASOFT) lancets Use as instructed to check sugar daily if needed.  E11.9. 01/27/21   Tonia Ghent, MD  Soft Lens Products (REFRESH CONTACTS DROPS) SOLN Place 1 drop into both eyes daily.    [provider]    Physical Exam: Vitals:   09/15/21 1100 09/15/21 1200 09/15/21 1444 09/15/21 1828  BP: (!) 183/83 (!) 179/87 (!) 157/83 (!) 162/71  Pulse: 65 (!) 42 77 82  Resp: '15 17 14 15  '$ Temp:    98.8 F (37.1 C)  TempSrc:    Oral  SpO2: 99% 97% 100% 99%  Weight:    78.7 kg  Height:    '5\' 8"'$  (1.727 m)   General:  Appears calm and comfortable and is in NAD Eyes:  PERRL, EOMI, normal lids, iris ENT:  grossly normal hearing, lips & tongue, mmm; appropriate dentition Neck:  no LAD, masses or thyromegaly; no carotid bruits Cardiovascular:  RRR, +systolic murmur. No LE edema.  Respiratory:   CTA bilaterally with no wheezes/rales/rhonchi.  Normal respiratory effort. Abdomen:  soft, NT, ND, NABS Back:   normal alignment, no CVAT Skin:  no rash or induration seen on limited exam Musculoskeletal:  grossly normal tone BUE/BLE: 5/5  bilaterally, good ROM, no bony abnormality Lower extremity:  No LE edema.  Limited foot exam with no ulcerations.  2+ distal pulses. Psychiatric:  grossly normal mood and affect, speech fluent and appropriate, AOx3 Neurologic:  CN 2-12 grossly intact, moves all extremities in coordinated fashion, sensation intact. HTK intact bilaterally, FTN intact bilaterally, negative pronator drift. DTR 2+. Gait deferred.    Radiological Exams on Admission: Independently reviewed - see discussion in A/P where applicable  CT ANGIO HEAD NECK W WO CM  Result Date: 09/15/2021 CLINICAL DATA:  Neuro deficit, acute, stroke suspected EXAM: CT ANGIOGRAPHY HEAD AND NECK TECHNIQUE: Multidetector CT imaging of the head and  neck was performed using the standard protocol during bolus administration of intravenous contrast. Multiplanar CT image reconstructions and MIPs were obtained to evaluate the vascular anatomy. Carotid stenosis measurements (when applicable) are obtained utilizing NASCET criteria, using the distal internal carotid diameter as the denominator. RADIATION DOSE REDUCTION: This exam was performed according to the departmental dose-optimization program which includes automated exposure control, adjustment of the mA and/or kV according to patient size and/or use of iterative reconstruction technique. CONTRAST:  45m OMNIPAQUE IOHEXOL 350 MG/ML SOLN COMPARISON:  MRI head from the same day. FINDINGS: CT HEAD FINDINGS Brain: Small right thalamic infarct, better characterized on same day MRI. No evidence of acute hemorrhage, hydrocephalus, extra-axial collection or mass lesion/mass effect. Vascular: Detailed below. Skull: No acute fracture. Sinuses: Visualized sinuses are clear. Orbits: No acute finding. Review of the MIP images confirms the above findings CTA NECK FINDINGS Aortic arch: Great vessel origins are patent without significant stenosis. Right carotid system: Atherosclerosis at the carotid bifurcation with  approximately 45% stenosis of the ICA origin. Left carotid system: Atherosclerosis at the carotid bifurcation with approximately 50% stenosis of the ICA origin. Vertebral arteries: Mildly left dominant. Patent bilaterally without significant (greater than 50%) stenosis. Skeleton: C5-C7 ACDF with solid bony fusion. Other neck: No acute findings. Upper chest: Mild biapical pleuroparenchymal scarring. Otherwise, visualized lung apices are clear. Review of the MIP images confirms the above findings CTA HEAD FINDINGS Anterior circulation: Atherosclerosis of bilateral intracranial ICAs. Severe right and mild left paraclinoid ICA stenosis. Bilateral MCAs and ACAs are patent. Suspect severe bilateral A3 ACA stenosis. Right M1 MCAs small with early bifurcation. No aneurysm identified. Posterior circulation: Bilateral intradural vertebral arteries, basilar artery and posterior cerebral arteries are patent. 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 Venous sinuses: As permitted by contrast timing, patent. Review of the MIP images confirms the above findings IMPRESSION: 1. 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 2. Severe right and mild left paraclinoid ICA stenosis. 3. Severe bilateral A3 ACA stenosis. 4. Approximately 50% left and 45% right ICA origin stenosis in the neck. Electronically Signed   By: FMargaretha SheffieldM.D.   On: 09/15/2021 17:47   MR BRAIN WO CONTRAST  Result Date: 09/15/2021 CLINICAL DATA:  Neuro deficit, acute, stroke suspected. Left-sided tingling. EXAM: MRI HEAD WITHOUT CONTRAST TECHNIQUE: Multiplanar, multiecho pulse sequences of the brain and surrounding structures were obtained without intravenous contrast. COMPARISON:  None Available. FINDINGS: Brain: There is a 7 mm acute infarct involving the lateral, inferior aspect of the right thalamus. No intracranial hemorrhage, mass, midline shift, or extra-axial fluid collection is identified. No significant chronic white  matter disease is evident. There is mild generalized cerebral atrophy. Vascular: Major intracranial vascular flow voids are preserved. Skull and upper cervical spine: Unremarkable bone marrow signal. Sinuses/Orbits: Unremarkable orbits. Trace fluid in the right sphenoid sinus. Clear mastoid air cells. Other: None. IMPRESSION: Acute right thalamic infarct. Electronically Signed   By: ALogan BoresM.D.   On: 09/15/2021 14:13   MR Cervical Spine Wo Contrast  Addendum Date: 09/15/2021   ADDENDUM REPORT: 09/15/2021 14:34 ADDENDUM: 1.6 cm lesion posterior to the left submandibular gland, indeterminate for a cyst, small venolymphatic malformation, or other mass. Correlate with physical examination and consider contrast-enhanced neck CT for further evaluation. Electronically Signed   By: ALogan BoresM.D.   On: 09/15/2021 14:34   Result Date: 09/15/2021 CLINICAL DATA:  Ataxia, nontraumatic, cervical pathology suspected. Left-sided tingling. EXAM: MRI CERVICAL SPINE WITHOUT CONTRAST TECHNIQUE: Multiplanar, multisequence MR  imaging of the cervical spine was performed. No intravenous contrast was administered. COMPARISON:  Cervical spine MRI 11/18/2008 FINDINGS: Alignment: Chronic straightening of the normal cervical lordosis. No significant listhesis. Vertebrae: No fracture, suspicious marrow lesion, or significant marrow edema. Interval C5-C7 ACDF with evidence of solid arthrodesis. Cord: Normal signal. Posterior Fossa, vertebral arteries, paraspinal tissues: 1.6 cm homogeneously T2 hyperintense lesion posterior to the left submandibular gland. Preserved vertebral artery flow voids. Disc levels: C2-3: Small central disc protrusion, less prominent than on the prior study and without associated spinal stenosis. Mild uncovertebral spurring and facet arthrosis without significant neural foraminal stenosis. C3-4: Mild disc space narrowing. Disc bulging, uncovertebral spurring, and moderate right and mild left facet  arthrosis result in moderate spinal stenosis and moderate to severe right and mild-to-moderate left neural foraminal stenosis, progressed from prior. C4-5: Mild disc space narrowing. Disc bulging, a central/left central disc protrusion, uncovertebral spurring, and mild facet arthrosis result in moderate spinal stenosis with mild cord flattening and moderate to severe bilateral neural foraminal stenosis, progressed from prior. C5-6: Interval ACDF. Patent spinal canal. Mild-to-moderate bilateral neural foraminal stenosis due to uncovertebral spurring. C6-7: Interval ACDF. Patent spinal canal. Mild-to-moderate bilateral neural foraminal stenosis due to uncovertebral spurring. C7-T1: Minimal disc bulging and uncovertebral spurring without stenosis. IMPRESSION: 1. C5-C7 ACDF without residual spinal stenosis. 2. Progressive disc degeneration at C3-4 and C4-5 with moderate spinal stenosis and moderate to severe neural foraminal stenosis. Electronically Signed: By: Logan Bores M.D. On: 09/15/2021 14:26   MR THORACIC SPINE WO CONTRAST  Result Date: 09/15/2021 CLINICAL DATA:  Ataxia, nontraumatic, thoracic pathology suspected. Left-sided tingling. EXAM: MRI THORACIC SPINE WITHOUT CONTRAST TECHNIQUE: Multiplanar, multisequence MR imaging of the thoracic spine was performed. No intravenous contrast was administered. COMPARISON:  None Available. FINDINGS: The study is motion degraded, including moderate motion on the axial T2 spin echo sequence. Alignment: Straightening of the normal thoracic kyphosis. No listhesis. Vertebrae: No fracture or suspicious marrow lesion. Degenerative endplate changes at H21-22 including minimal degenerative edema. Cord: No cord signal abnormality identified within limitations of motion artifact. Paraspinal and other soft tissues: Unremarkable. Disc levels: T1-2 and T2-3: Negative. T3-4: Disc bulging without significant stenosis. T4-5: Small left paracentral disc protrusion and mild facet  arthrosis without significant stenosis. T5-6: Disc bulging and moderate facet arthrosis result in mild bilateral neural foraminal stenosis without significant spinal stenosis. T6-7: Small right paracentral disc protrusion without significant stenosis. T7-8: A right central disc protrusion result in moderate spinal stenosis with mild cord flattening. Mild facet arthrosis. No significant neural foraminal stenosis. T8-9: Disc bulging and a central disc protrusion result in mild spinal stenosis without significant neural foraminal stenosis. T9-10: Disc bulging and severe facet arthrosis result in moderate right greater than left neural foraminal stenosis without spinal stenosis. T10-11: Right eccentric disc bulging and mild-to-moderate facet arthrosis result in mild spinal stenosis and moderate right neural foraminal stenosis. T11-12: Negative. T12-L1: A broad posterior disc protrusion results in borderline spinal stenosis without neural foraminal stenosis. IMPRESSION: 1. Motion degraded examination. 2. Moderate spinal stenosis at T7-8 due to a disc protrusion. 3. Mild spinal stenosis at T8-9 and T10-11. 4. Moderate neural foraminal stenosis bilaterally at T9-10 and on the right at T10-11. Electronically Signed   By: Logan Bores M.D.   On: 09/15/2021 14:33   DG Chest Port 1 View  Result Date: 09/15/2021 CLINICAL DATA:  A 71 year old male presents for evaluation of bradycardia. EXAM: PORTABLE CHEST 1 VIEW COMPARISON:  May 02, 2011 FINDINGS: EKG leads project over  the chest. Cardiomediastinal contours and hilar structures are normal. Lungs are clear. No acute skeletal process on limited assessment. IMPRESSION: No acute cardiopulmonary disease. Electronically Signed   By: Zetta Bills M.D.   On: 09/15/2021 09:57    EKG: Independently reviewed.  Sinus bradycardia with rate 41, mobitz type 2. RBBB; nonspecific ST changes with no evidence of acute ischemia No previous ekg since 2013. RBBB there since that time.     Labs on Admission: I have personally reviewed the available labs and imaging studies at the time of the admission.  Pertinent labs:   Glucose 228   Assessment and Plan: Principal Problem:   Acute CVA (cerebrovascular accident) ([place]) Active Problems:   Mobitz type 2 second degree AV block   Type 2 diabetes mellitus without complications ([place])   Essential hypertension   HYPERLIPIDEMIA   Spinal stenosis at T7-T8 due to disc protrusion    Facial mass    Assessment and Plan: * Acute CVA (cerebrovascular accident) ([place]) 71 year old with history of HTN, HLD, T2DM presenting to ED with   Found to have acute thalamic CVA.  -admit to telemetry for stroke work-up -Neurochecks per protocol -Neurology consulted -MRI brain without contrast done -CTA head/neck ordered -echo pending -recent A1C of 7.1, goal <6.5 -LDL of 122, needs higher intensity statin >stop pravastatin and start crestor '20mg'$  -start aspirin  -Permissive hypertension first 24 hours <220/110 -N.p.o. until bedside swallow screen -PT/ OT/ SLP consult   Mobitz type 2 second degree AV block New finding, but no ekg since 2013 Seems asymptomatic, stable Cardiology consulted in ED, keep on telemetry Hold beta blocker   Type 2 diabetes mellitus without complications ([place]) A0O of 7.1 in 07/2021. Goal A1C <6.5 in setting of acute CVA  Hold metformin SSI and accuchecks per protocol.   Essential hypertension Allow for permissive HTN in setting of acute CVA in first 24-48 hours IV parameters with hydralazine prn  Hold beta blocker with mobitz type 2 regardless   Spinal stenosis at T7-T8 due to disc protrusion  Moderate, recommend f/u outpatient   HYPERLIPIDEMIA Recent lipid panel with LDL of 122 On pravachol '20mg'$ >change to high intensity statin: crestor '20mg'$   Goal LDL <70   Facial mass 1.6 cm lesion posterior to the left submandibular gland, indeterminate for a cyst, venolymphatic malformation or other mass.  Asymptomatic  -palpable on exam, non tender/mobile -may need dedicated neck CT inpatient vs. Outpatient     Advance Care Planning:   Code Status: Full Code   Consults: neurology and cardiology   DVT Prophylaxis: SCDs   Family Communication: wife at bedside: [name]   Severity of Illness: The appropriate patient status for this patient is INPATIENT. Inpatient status is judged to be reasonable and necessary in order to provide the required intensity of service to ensure the patient's safety. The patient's presenting symptoms, physical exam findings, and initial radiographic and laboratory data in the context of their chronic comorbidities is felt to place them at high risk for further clinical deterioration. Furthermore, it is not anticipated that the patient will be medically stable for discharge from the hospital within 2 midnights of admission.   * I certify that at the point of admission it is my clinical judgment that the patient will require inpatient hospital care spanning beyond 2 midnights from the point of admission due to high intensity of service, high risk for further deterioration and high frequency of surveillance required.*  Author: Orma Flaming, MD 09/15/2021 8:01 PM  For on call review [email or web address].

## 2021-09-15 NOTE — ED Triage Notes (Signed)
Pt woke up at 630 AM and noticed his left side was tingling. Pt states he went to bed around 10 PM last night and felt normal.   BP 192/98 HR 80 100% RA  CBG 101

## 2021-09-15 NOTE — ED Notes (Signed)
Pt transported to MRI 

## 2021-09-15 NOTE — Assessment & Plan Note (Signed)
Moderate, recommend f/u outpatient

## 2021-09-15 NOTE — Assessment & Plan Note (Addendum)
71 year old with history of HTN, HLD, T2DM presenting to ED with   Found to have acute thalamic CVA.  -admit to telemetry for stroke work-up -Neurochecks per protocol -Neurology consulted -MRI brain without contrast done -CTA head/neck ordered -echo pending -recent A1C of 7.1, goal <6.5 -LDL of 122, needs higher intensity statin >stop pravastatin and start crestor '20mg'$  -start aspirin  -Permissive hypertension first 24 hours <220/110 -N.p.o. until bedside swallow screen -PT/ OT/ SLP consult

## 2021-09-16 ENCOUNTER — Inpatient Hospital Stay (HOSPITAL_BASED_OUTPATIENT_CLINIC_OR_DEPARTMENT_OTHER): Payer: Federal, State, Local not specified - PPO

## 2021-09-16 DIAGNOSIS — E119 Type 2 diabetes mellitus without complications: Secondary | ICD-10-CM

## 2021-09-16 DIAGNOSIS — E782 Mixed hyperlipidemia: Secondary | ICD-10-CM | POA: Diagnosis not present

## 2021-09-16 DIAGNOSIS — I441 Atrioventricular block, second degree: Secondary | ICD-10-CM

## 2021-09-16 DIAGNOSIS — I639 Cerebral infarction, unspecified: Secondary | ICD-10-CM | POA: Diagnosis not present

## 2021-09-16 DIAGNOSIS — I6389 Other cerebral infarction: Secondary | ICD-10-CM | POA: Diagnosis not present

## 2021-09-16 DIAGNOSIS — I10 Essential (primary) hypertension: Secondary | ICD-10-CM

## 2021-09-16 LAB — GLUCOSE, CAPILLARY
Glucose-Capillary: 229 mg/dL — ABNORMAL HIGH (ref 70–99)
Glucose-Capillary: 192 mg/dL — ABNORMAL HIGH (ref 70–99)

## 2021-09-16 LAB — ECHOCARDIOGRAM COMPLETE
AR max vel: 2.32 cm2
AV Area VTI: 2.5 cm2
AV Area mean vel: 2.26 cm2
AV Mean grad: 10 mmHg
AV Peak grad: 18.5 mmHg
Ao pk vel: 2.15 m/s
Area-P 1/2: 2.76 cm2
Height: 68 in
S' Lateral: 3.4 cm
Weight: 2776.03 oz

## 2021-09-16 LAB — LIPID PANEL
Cholesterol: 239 mg/dL — ABNORMAL HIGH (ref 0–200)
HDL: 34 mg/dL — ABNORMAL LOW (ref >40)
LDL Cholesterol: 142 mg/dL — ABNORMAL HIGH (ref 0–99)
Total CHOL/HDL Ratio: 7 RATIO
Triglycerides: 317 mg/dL — ABNORMAL HIGH (ref <150)
VLDL: 63 mg/dL — ABNORMAL HIGH (ref 0–40)

## 2021-09-16 LAB — HEMOGLOBIN A1C
Hgb A1c MFr Bld: 7.3 % — ABNORMAL HIGH (ref 4.8–5.6)
Mean Plasma Glucose: 162.81 mg/dL

## 2021-09-16 LAB — RAPID URINE DRUG SCREEN, HOSP PERFORMED
Amphetamines: NOT DETECTED
Barbiturates: NOT DETECTED
Benzodiazepines: NOT DETECTED
Cocaine: NOT DETECTED
Opiates: NOT DETECTED
Tetrahydrocannabinol: NOT DETECTED

## 2021-09-16 LAB — TSH: TSH: 2.429 u[IU]/mL (ref 0.350–4.500)

## 2021-09-16 MED ORDER — BENAZEPRIL HCL 20 MG PO TABS: 20.0000 mg | ORAL_TABLET | Freq: Every day | ORAL | 3 refills | Status: DC

## 2021-09-16 MED ORDER — ROSUVASTATIN CALCIUM 20 MG PO TABS: 20.0000 mg | ORAL_TABLET | Freq: Every day | ORAL | 3 refills | Status: DC

## 2021-09-16 MED ORDER — CLOPIDOGREL BISULFATE 75 MG PO TABS
300.0000 mg | ORAL_TABLET | Freq: Once | ORAL | Status: AC
  Administered 2021-09-16: 300 mg via ORAL
  Filled 2021-09-16: qty 4

## 2021-09-16 MED ORDER — CLOPIDOGREL BISULFATE 75 MG PO TABS: 75.0000 mg | ORAL_TABLET | Freq: Every day | ORAL | Status: DC

## 2021-09-16 MED ORDER — CLOPIDOGREL BISULFATE 75 MG PO TABS: 75.0000 mg | ORAL_TABLET | Freq: Every day | ORAL | 0 refills | Status: AC

## 2021-09-16 MED ORDER — ASPIRIN 81 MG PO CHEW: 81.0000 mg | CHEWABLE_TABLET | Freq: Every day | ORAL | 12 refills | Status: DC

## 2021-09-16 NOTE — Progress Notes (Signed)
[name] to be D/C'd per MD order. Discussed with the patient and all questions fully answered.  Skin clean, dry and intact without evidence of skin break down, no evidence of skin tears noted.  IV catheter discontinued intact. Site without signs and symptoms of complications. Dressing and pressure applied.  An After Visit Summary was printed and given to the patient.  Patient escorted via [place], and D/C home via private auto.  Melonie Florida  09/16/2021 4:08 PM

## 2021-09-16 NOTE — Progress Notes (Signed)
PT Cancellation Note  Patient Details Name: [name] MRN: [id number] DOB: 08-22-1950   Cancelled Treatment:    Reason Eval/Treat Not Completed: Patient at procedure or test/unavailable  Patient undergoing echo. Will return.    Arby Barrette, PT Acute Rehabilitation Services  Pager [phone] Office [phone]   Rexanne Mano 09/16/2021, 9:56 AM

## 2021-09-16 NOTE — Progress Notes (Signed)
Chaplain responded to a spiritual care consult, pt requests prayer.  Pt stated that his pastor had come and prayed with him yesterday.  Chaplain provided general emotional support and orientation to services.  Pt declined further needs at this time.    Please call if requested or needed for follow-up.  [place], [place] Pager:  [phone]    09/16/21 1112  Clinical Encounter Type  Visited With Patient and family together  Visit Type Initial  Referral From Patient  Consult/Referral To Chaplain  Spiritual Encounters  Spiritual Needs Prayer  Stress Factors  Patient Stress Factors Health changes

## 2021-09-16 NOTE — Progress Notes (Incomplete)
PROGRESS NOTE   [name]  BVQ:[id number] DOB: [birth year] DOA: 09/15/2021 PCP: Tonia Ghent, MD  Brief Narrative:  71 year old male Prior spinal stenosis L2-3-4-5 + disc herniation L3-4 left status post decompressive laminectomy 05/09/2011 Dr. 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 (recent A1c 7.1), HTN, HLD Present 09/15/2021 2 [facility] ED L hand/foot tingling 0 600- Work-up = acute thalamic CVA--EKG Mobitz type II second-degree AVB Facial mass 1.6 lesion posterior left submandibular indeterminate cyst  Neurology and cardiology consulted  Hospital-Problem based course  Triglycerides 317 total cholesterol 239 VLDL 63 TSH 2.42  DVT prophylaxis:  Code Status:  Family Communication:  Disposition:  Status is: Inpatient {Inpatient:23812}   Consultants:  ***  Procedures: ***  Antimicrobials: ***    Subjective: ***  Objective: Vitals:   09/15/21 2316 09/16/21 0000 09/16/21 0409 09/16/21 0752  BP: (!) 156/81  (!) 148/74 (!) 141/72  Pulse: 79  68 80  Resp: '17 15 14   '$ Temp: 98.5 F (36.9 C)  98.1 F (36.7 C)   TempSrc: Oral  Oral   SpO2: 98%  100% 97%  Weight:      Height:        Intake/Output Summary (Last 24 hours) at 09/16/2021 0800 Last data filed at 09/16/2021 0400 Gross per 24 hour  Intake 220 ml  Output --  Net 220 ml   Filed Weights   09/15/21 0818 09/15/21 1828  Weight: 82.1 kg 78.7 kg    Examination:    Data Reviewed: personally reviewed   CBC    Component Value Date/Time   WBC 6.6 09/15/2021 0851   RBC 4.54 09/15/2021 0851   HGB 14.5 09/15/2021 0851   HCT 42.3 09/15/2021 0851   PLT 223 09/15/2021 0851   MCV 93.2 09/15/2021 0851   MCH 31.9 09/15/2021 0851   MCHC 34.3 09/15/2021 0851   RDW 13.2 09/15/2021 0851   LYMPHSABS 1.4 09/15/2021 0851   MONOABS 0.5 09/15/2021 0851   EOSABS 0.1 09/15/2021 0851   BASOSABS 0.0 09/15/2021 0851      Latest Ref Rng & Units 09/15/2021    8:51 AM 07/31/2021    9:42 AM 07/07/2020    7:49 AM  CMP   Glucose 70 - 99 mg/dL 228   143   150    BUN 8 - 23 mg/dL '22   20   18    '$ Creatinine 0.61 - 1.24 mg/dL 1.06   0.95   0.93    Sodium 135 - 145 mmol/L 136   136   137    Potassium 3.5 - 5.1 mmol/L 4.1   3.8   3.5    Chloride 98 - 111 mmol/L 106   101   103    CO2 22 - 32 mmol/L '20   26   26    '$ Calcium 8.9 - 10.3 mg/dL 9.3   9.8   9.5    Total Protein 6.5 - 8.1 g/dL 7.1   7.7   7.2    Total Bilirubin 0.3 - 1.2 mg/dL 0.8   0.7   0.7    Alkaline Phos 38 - 126 U/L 36   41   36    AST 15 - 41 U/L '28   27   27    '$ ALT 0 - 44 U/L '27   26   28       '$ Radiology Studies: CT ANGIO HEAD NECK W WO CM  Result Date: 09/15/2021 CLINICAL DATA:  Neuro deficit, acute,  stroke suspected EXAM: CT ANGIOGRAPHY HEAD AND NECK TECHNIQUE: Multidetector CT imaging of the head and neck was performed using the standard protocol during bolus administration of intravenous contrast. Multiplanar CT image reconstructions and MIPs were obtained to evaluate the vascular anatomy. Carotid stenosis measurements (when applicable) are obtained utilizing NASCET criteria, using the distal internal carotid diameter as the denominator. RADIATION DOSE REDUCTION: This exam was performed according to the departmental dose-optimization program which includes automated exposure control, adjustment of the mA and/or kV according to patient size and/or use of iterative reconstruction technique. CONTRAST:  76m OMNIPAQUE IOHEXOL 350 MG/ML SOLN COMPARISON:  MRI head from the same day. FINDINGS: CT HEAD FINDINGS Brain: Small right thalamic infarct, better characterized on same day MRI. No evidence of acute hemorrhage, hydrocephalus, extra-axial collection or mass lesion/mass effect. Vascular: Detailed below. Skull: No acute fracture. Sinuses: Visualized sinuses are clear. Orbits: No acute finding. Review of the MIP images confirms the above findings CTA NECK FINDINGS Aortic arch: Great vessel origins are patent without significant stenosis. Right carotid  system: Atherosclerosis at the carotid bifurcation with approximately 45% stenosis of the ICA origin. Left carotid system: Atherosclerosis at the carotid bifurcation with approximately 50% stenosis of the ICA origin. Vertebral arteries: Mildly left dominant. Patent bilaterally without significant (greater than 50%) stenosis. Skeleton: C5-C7 ACDF with solid bony fusion. Other neck: No acute findings. Upper chest: Mild biapical pleuroparenchymal scarring. Otherwise, visualized lung apices are clear. Review of the MIP images confirms the above findings CTA HEAD FINDINGS Anterior circulation: Atherosclerosis of bilateral intracranial ICAs. Severe right and mild left paraclinoid ICA stenosis. Bilateral MCAs and ACAs are patent. Suspect severe bilateral A3 ACA stenosis. Right M1 MCAs small with early bifurcation. No aneurysm identified. Posterior circulation: Bilateral intradural vertebral arteries, basilar artery and posterior cerebral arteries are patent. 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 Venous sinuses: As permitted by contrast timing, patent. Review of the MIP images confirms the above findings IMPRESSION: 1. 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 2. Severe right and mild left paraclinoid ICA stenosis. 3. Severe bilateral A3 ACA stenosis. 4. Approximately 50% left and 45% right ICA origin stenosis in the neck. Electronically Signed   By: FMargaretha SheffieldM.D.   On: 09/15/2021 17:47   MR BRAIN WO CONTRAST  Result Date: 09/15/2021 CLINICAL DATA:  Neuro deficit, acute, stroke suspected. Left-sided tingling. EXAM: MRI HEAD WITHOUT CONTRAST TECHNIQUE: Multiplanar, multiecho pulse sequences of the brain and surrounding structures were obtained without intravenous contrast. COMPARISON:  None Available. FINDINGS: Brain: There is a 7 mm acute infarct involving the lateral, inferior aspect of the right thalamus. No intracranial hemorrhage, mass, midline shift, or extra-axial fluid  collection is identified. No significant chronic white matter disease is evident. There is mild generalized cerebral atrophy. Vascular: Major intracranial vascular flow voids are preserved. Skull and upper cervical spine: Unremarkable bone marrow signal. Sinuses/Orbits: Unremarkable orbits. Trace fluid in the right sphenoid sinus. Clear mastoid air cells. Other: None. IMPRESSION: Acute right thalamic infarct. Electronically Signed   By: ALogan BoresM.D.   On: 09/15/2021 14:13   MR Cervical Spine Wo Contrast  Addendum Date: 09/15/2021   ADDENDUM REPORT: 09/15/2021 14:34 ADDENDUM: 1.6 cm lesion posterior to the left submandibular gland, indeterminate for a cyst, small venolymphatic malformation, or other mass. Correlate with physical examination and consider contrast-enhanced neck CT for further evaluation. Electronically Signed   By: ALogan BoresM.D.   On: 09/15/2021 14:34   Result Date: 09/15/2021 CLINICAL DATA:  Ataxia,  nontraumatic, cervical pathology suspected. Left-sided tingling. EXAM: MRI CERVICAL SPINE WITHOUT CONTRAST TECHNIQUE: Multiplanar, multisequence MR imaging of the cervical spine was performed. No intravenous contrast was administered. COMPARISON:  Cervical spine MRI 11/18/2008 FINDINGS: Alignment: Chronic straightening of the normal cervical lordosis. No significant listhesis. Vertebrae: No fracture, suspicious marrow lesion, or significant marrow edema. Interval C5-C7 ACDF with evidence of solid arthrodesis. Cord: Normal signal. Posterior Fossa, vertebral arteries, paraspinal tissues: 1.6 cm homogeneously T2 hyperintense lesion posterior to the left submandibular gland. Preserved vertebral artery flow voids. Disc levels: C2-3: Small central disc protrusion, less prominent than on the prior study and without associated spinal stenosis. Mild uncovertebral spurring and facet arthrosis without significant neural foraminal stenosis. C3-4: Mild disc space narrowing. Disc bulging, uncovertebral  spurring, and moderate right and mild left facet arthrosis result in moderate spinal stenosis and moderate to severe right and mild-to-moderate left neural foraminal stenosis, progressed from prior. C4-5: Mild disc space narrowing. Disc bulging, a central/left central disc protrusion, uncovertebral spurring, and mild facet arthrosis result in moderate spinal stenosis with mild cord flattening and moderate to severe bilateral neural foraminal stenosis, progressed from prior. C5-6: Interval ACDF. Patent spinal canal. Mild-to-moderate bilateral neural foraminal stenosis due to uncovertebral spurring. C6-7: Interval ACDF. Patent spinal canal. Mild-to-moderate bilateral neural foraminal stenosis due to uncovertebral spurring. C7-T1: Minimal disc bulging and uncovertebral spurring without stenosis. IMPRESSION: 1. C5-C7 ACDF without residual spinal stenosis. 2. Progressive disc degeneration at C3-4 and C4-5 with moderate spinal stenosis and moderate to severe neural foraminal stenosis. Electronically Signed: By: Logan Bores M.D. On: 09/15/2021 14:26   MR THORACIC SPINE WO CONTRAST  Result Date: 09/15/2021 CLINICAL DATA:  Ataxia, nontraumatic, thoracic pathology suspected. Left-sided tingling. EXAM: MRI THORACIC SPINE WITHOUT CONTRAST TECHNIQUE: Multiplanar, multisequence MR imaging of the thoracic spine was performed. No intravenous contrast was administered. COMPARISON:  None Available. FINDINGS: The study is motion degraded, including moderate motion on the axial T2 spin echo sequence. Alignment: Straightening of the normal thoracic kyphosis. No listhesis. Vertebrae: No fracture or suspicious marrow lesion. Degenerative endplate changes at T41-96 including minimal degenerative edema. Cord: No cord signal abnormality identified within limitations of motion artifact. Paraspinal and other soft tissues: Unremarkable. Disc levels: T1-2 and T2-3: Negative. T3-4: Disc bulging without significant stenosis. T4-5: Small left  paracentral disc protrusion and mild facet arthrosis without significant stenosis. T5-6: Disc bulging and moderate facet arthrosis result in mild bilateral neural foraminal stenosis without significant spinal stenosis. T6-7: Small right paracentral disc protrusion without significant stenosis. T7-8: A right central disc protrusion result in moderate spinal stenosis with mild cord flattening. Mild facet arthrosis. No significant neural foraminal stenosis. T8-9: Disc bulging and a central disc protrusion result in mild spinal stenosis without significant neural foraminal stenosis. T9-10: Disc bulging and severe facet arthrosis result in moderate right greater than left neural foraminal stenosis without spinal stenosis. T10-11: Right eccentric disc bulging and mild-to-moderate facet arthrosis result in mild spinal stenosis and moderate right neural foraminal stenosis. T11-12: Negative. T12-L1: A broad posterior disc protrusion results in borderline spinal stenosis without neural foraminal stenosis. IMPRESSION: 1. Motion degraded examination. 2. Moderate spinal stenosis at T7-8 due to a disc protrusion. 3. Mild spinal stenosis at T8-9 and T10-11. 4. Moderate neural foraminal stenosis bilaterally at T9-10 and on the right at T10-11. Electronically Signed   By: Logan Bores M.D.   On: 09/15/2021 14:33   DG Chest Port 1 View  Result Date: 09/15/2021 CLINICAL DATA:  A 71 year old male presents for evaluation of  bradycardia. EXAM: PORTABLE CHEST 1 VIEW COMPARISON:  May 02, 2011 FINDINGS: EKG leads project over the chest. Cardiomediastinal contours and hilar structures are normal. Lungs are clear. No acute skeletal process on limited assessment. IMPRESSION: No acute cardiopulmonary disease. Electronically Signed   By: Zetta Bills M.D.   On: 09/15/2021 09:57     Scheduled Meds:  aspirin  81 mg Oral Daily   [START ON 09/17/2021] clopidogrel  75 mg Oral Daily   fenofibrate  160 mg Oral Daily   folic acid  1 mg  Oral Daily   insulin aspart  0-9 Units Subcutaneous TID WC   rosuvastatin  20 mg Oral QHS   Continuous Infusions:   LOS: 1 day   Time spent: ***  Nita Sells, MD [facility] To contact the attending provider between 7A-7P or the covering provider during after hours 7P-7A, please log into the web site [email or web address] and access using universal [place] password for that web site. If you do not have the password, please call the hospital operator.  09/16/2021, 8:00 AM

## 2021-09-16 NOTE — Evaluation (Signed)
Occupational Therapy Evaluation Patient Details Name: [name] MRN: [id number] DOB: 1950/12/08 Today's Date: 09/16/2021   History of Present Illness 71 y.o. male who presented to ED 09/15/21 with complaints of tingling in his hand and foot and felt funny walking. MRI brain: acute right thalamic infarct PMH significant for hypertension, hyperlipidemia, diabetes, obstructive sleep apnea managed with nasal surgery, overweight (BMI 26.38), former smoking, C5-7 ACDF   Clinical Impression   Pt admitted for concerns listed above. PTA pt reported that he was independent with all ADL's and IADL's, including working for an [employer]. At this time, pt appears near his baseline. He is independent with all ADL's and functional mobility. Vision and cognition WFL. Pt has no further skilled OT needs and acute OT will sign off.       Recommendations for follow up therapy are one component of a multi-disciplinary discharge planning process, led by the attending physician.  Recommendations may be updated based on patient status, additional functional criteria and insurance authorization.   Follow Up Recommendations  No OT follow up    Assistance Recommended at Discharge None  Patient can return home with the following      Functional Status Assessment  Patient has had a recent decline in their functional status and demonstrates the ability to make significant improvements in function in a reasonable and predictable amount of time.  Equipment Recommendations  None recommended by OT    Recommendations for Other Services       Precautions / Restrictions Precautions Precautions: None Restrictions Weight Bearing Restrictions: No      Mobility Bed Mobility Overal bed mobility: Independent                  Transfers Overall transfer level: Independent                        Balance Overall balance assessment: No apparent balance deficits (not formally assessed)                                          ADL either performed or assessed with clinical judgement   ADL Overall ADL's : At baseline;Independent                                       General ADL Comments: No assist needed, safe with all functional mobility and balance     Vision Baseline Vision/History: 1 Wears glasses Ability to See in Adequate Light: 0 Adequate Patient Visual Report: No change from baseline Vision Assessment?: No apparent visual deficits     Perception     Praxis      Pertinent Vitals/Pain Pain Assessment Pain Assessment: No/denies pain     Hand Dominance     Extremity/Trunk Assessment Upper Extremity Assessment Upper Extremity Assessment: Overall WFL for tasks assessed (mild numbness in LUE)   Lower Extremity Assessment Lower Extremity Assessment: Defer to PT evaluation   Cervical / Trunk Assessment Cervical / Trunk Assessment: Normal   Communication Communication Communication: No difficulties   Cognition Arousal/Alertness: Awake/alert Behavior During Therapy: WFL for tasks assessed/performed Overall Cognitive Status: Within Functional Limits for tasks assessed  General Comments  VSS on RA, wife present and supportive    Exercises     Shoulder Instructions      Home Living Family/patient expects to be discharged to:: Private residence Living Arrangements: Spouse/significant other Available Help at Discharge: Family;Available 24 hours/day Type of Home: House (townhouse) Home Access: Stairs to enter [employer] of Steps: 1 Entrance Stairs-Rails: None Home Layout: One level     Bathroom Shower/Tub: Occupational psychologist: Handicapped height     Home Equipment: None          Prior Functioning/Environment Prior Level of Function : Independent/Modified Independent;Working/employed;Driving             Mobility Comments:  works at [employer] List: Decreased strength;Impaired balance (sitting and/or standing)      OT Treatment/Interventions:      OT Goals(Current goals can be found in the care plan section) Acute Rehab OT Goals Patient Stated Goal: to go home OT Goal Formulation: With patient Time For Goal Achievement: 09/16/21 Potential to Achieve Goals: Good  OT Frequency:      Co-evaluation              AM-PAC OT "6 Clicks" Daily Activity     Outcome Measure Help from another person eating meals?: None Help from another person taking care of personal grooming?: None Help from another person toileting, which includes using toliet, bedpan, or urinal?: None Help from another person bathing (including washing, rinsing, drying)?: None Help from another person to put on and taking off regular upper body clothing?: None Help from another person to put on and taking off regular lower body clothing?: None 6 Click Score: 24   End of Session Nurse Communication: Mobility status  Activity Tolerance: Patient tolerated treatment well Patient left: in bed;with call bell/phone within reach;with family/visitor present  OT Visit Diagnosis: Unsteadiness on feet (R26.81);Other abnormalities of gait and mobility (R26.89);Muscle weakness (generalized) (M62.81)                Time: 9326-7124 OT Time Calculation (min): 11 min Charges:  OT General Charges $OT Visit: 1 Visit OT Evaluation $OT Eval Low Complexity: [place]., OTR/L Acute Rehabilitation  Nakya Weyand Elane Yolanda Bonine 09/16/2021, 2:34 PM

## 2021-09-16 NOTE — Progress Notes (Signed)
Cardiologist:  Tobb  Subjective:  Denies SSCP, palpitations or Dyspnea   Objective:  Vitals:   09/15/21 2316 09/16/21 0000 09/16/21 0409 09/16/21 0752  BP: (!) 156/81  (!) 148/74 (!) 141/72  Pulse: 79  68 80  Resp: '17 15 14   '$ Temp: 98.5 F (36.9 C)  98.1 F (36.7 C) 98 F (36.7 C)  TempSrc: Oral  Oral Oral  SpO2: 98%  100% 97%  Weight:      Height:        Intake/Output from previous day:  Intake/Output Summary (Last 24 hours) at 09/16/2021 1053 Last data filed at 09/16/2021 0400 Gross per 24 hour  Intake 220 ml  Output --  Net 220 ml    Physical Exam: Affect appropriate Healthy:  appears stated age HEENT: normal Neck supple with no adenopathy JVP normal no bruits no thyromegaly Lungs clear with no wheezing and good diaphragmatic motion Heart:  S1/S2 no murmur, no rub, gallop or click PMI normal Abdomen: benighn, BS positve, no tenderness, no AAA no bruit.  No HSM or HJR Distal pulses intact with no bruits No edema Neuro non-focal Skin warm and dry No muscular weakness   Lab Results: Basic Metabolic Panel: Recent Labs    09/15/21 0851  NA 136  K 4.1  CL 106  CO2 20*  GLUCOSE 228*  BUN 22  CREATININE 1.06  CALCIUM 9.3   Liver Function Tests: Recent Labs    09/15/21 0851  AST 28  ALT 27  ALKPHOS 36*  BILITOT 0.8  PROT 7.1  ALBUMIN 3.7   No results for input(s): LIPASE, AMYLASE in the last 72 hours. CBC: Recent Labs    09/15/21 0851  WBC 6.6  NEUTROABS 4.6  HGB 14.5  HCT 42.3  MCV 93.2  PLT 223   Cardiac Enzymes: No results for input(s): CKTOTAL, CKMB, CKMBINDEX, TROPONINI in the last 72 hours. BNP: Invalid input(s): POCBNP D-Dimer: No results for input(s): DDIMER in the last 72 hours. Hemoglobin A1C: Recent Labs    09/16/21 0405  HGBA1C 7.3*   Fasting Lipid Panel: Recent Labs    09/16/21 0405  CHOL 239*  HDL 34*  LDLCALC 142*  TRIG 317*  CHOLHDL 7.0   Thyroid Function Tests: Recent Labs    09/15/21 2309   TSH 2.429   Anemia Panel: No results for input(s): VITAMINB12, FOLATE, FERRITIN, TIBC, IRON, RETICCTPCT in the last 72 hours.  Imaging: CT ANGIO HEAD NECK W WO CM  Result Date: 09/15/2021 CLINICAL DATA:  Neuro deficit, acute, stroke suspected EXAM: CT ANGIOGRAPHY HEAD AND NECK TECHNIQUE: Multidetector CT imaging of the head and neck was performed using the standard protocol during bolus administration of intravenous contrast. Multiplanar CT image reconstructions and MIPs were obtained to evaluate the vascular anatomy. Carotid stenosis measurements (when applicable) are obtained utilizing NASCET criteria, using the distal internal carotid diameter as the denominator. RADIATION DOSE REDUCTION: This exam was performed according to the departmental dose-optimization program which includes automated exposure control, adjustment of the mA and/or kV according to patient size and/or use of iterative reconstruction technique. CONTRAST:  65m OMNIPAQUE IOHEXOL 350 MG/ML SOLN COMPARISON:  MRI head from the same day. FINDINGS: CT HEAD FINDINGS Brain: Small right thalamic infarct, better characterized on same day MRI. No evidence of acute hemorrhage, hydrocephalus, extra-axial collection or mass lesion/mass effect. Vascular: Detailed below. Skull: No acute fracture. Sinuses: Visualized sinuses are clear. Orbits: No acute finding. Review of the MIP images confirms the above findings CTA NECK FINDINGS  Aortic arch: Great vessel origins are patent without significant stenosis. Right carotid system: Atherosclerosis at the carotid bifurcation with approximately 45% stenosis of the ICA origin. Left carotid system: Atherosclerosis at the carotid bifurcation with approximately 50% stenosis of the ICA origin. Vertebral arteries: Mildly left dominant. Patent bilaterally without significant (greater than 50%) stenosis. Skeleton: C5-C7 ACDF with solid bony fusion. Other neck: No acute findings. Upper chest: Mild biapical  pleuroparenchymal scarring. Otherwise, visualized lung apices are clear. Review of the MIP images confirms the above findings CTA HEAD FINDINGS Anterior circulation: Atherosclerosis of bilateral intracranial ICAs. Severe right and mild left paraclinoid ICA stenosis. Bilateral MCAs and ACAs are patent. Suspect severe bilateral A3 ACA stenosis. Right M1 MCAs small with early bifurcation. No aneurysm identified. Posterior circulation: Bilateral intradural vertebral arteries, basilar artery and posterior cerebral arteries are patent. 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 Venous sinuses: As permitted by contrast timing, patent. Review of the MIP images confirms the above findings IMPRESSION: 1. 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 2. Severe right and mild left paraclinoid ICA stenosis. 3. Severe bilateral A3 ACA stenosis. 4. Approximately 50% left and 45% right ICA origin stenosis in the neck. Electronically Signed   By: Margaretha Sheffield M.D.   On: 09/15/2021 17:47   MR BRAIN WO CONTRAST  Result Date: 09/15/2021 CLINICAL DATA:  Neuro deficit, acute, stroke suspected. Left-sided tingling. EXAM: MRI HEAD WITHOUT CONTRAST TECHNIQUE: Multiplanar, multiecho pulse sequences of the brain and surrounding structures were obtained without intravenous contrast. COMPARISON:  None Available. FINDINGS: Brain: There is a 7 mm acute infarct involving the lateral, inferior aspect of the right thalamus. No intracranial hemorrhage, mass, midline shift, or extra-axial fluid collection is identified. No significant chronic white matter disease is evident. There is mild generalized cerebral atrophy. Vascular: Major intracranial vascular flow voids are preserved. Skull and upper cervical spine: Unremarkable bone marrow signal. Sinuses/Orbits: Unremarkable orbits. Trace fluid in the right sphenoid sinus. Clear mastoid air cells. Other: None. IMPRESSION: Acute right thalamic infarct. Electronically  Signed   By: Logan Bores M.D.   On: 09/15/2021 14:13   MR Cervical Spine Wo Contrast  Addendum Date: 09/15/2021   ADDENDUM REPORT: 09/15/2021 14:34 ADDENDUM: 1.6 cm lesion posterior to the left submandibular gland, indeterminate for a cyst, small venolymphatic malformation, or other mass. Correlate with physical examination and consider contrast-enhanced neck CT for further evaluation. Electronically Signed   By: Logan Bores M.D.   On: 09/15/2021 14:34   Result Date: 09/15/2021 CLINICAL DATA:  Ataxia, nontraumatic, cervical pathology suspected. Left-sided tingling. EXAM: MRI CERVICAL SPINE WITHOUT CONTRAST TECHNIQUE: Multiplanar, multisequence MR imaging of the cervical spine was performed. No intravenous contrast was administered. COMPARISON:  Cervical spine MRI 11/18/2008 FINDINGS: Alignment: Chronic straightening of the normal cervical lordosis. No significant listhesis. Vertebrae: No fracture, suspicious marrow lesion, or significant marrow edema. Interval C5-C7 ACDF with evidence of solid arthrodesis. Cord: Normal signal. Posterior Fossa, vertebral arteries, paraspinal tissues: 1.6 cm homogeneously T2 hyperintense lesion posterior to the left submandibular gland. Preserved vertebral artery flow voids. Disc levels: C2-3: Small central disc protrusion, less prominent than on the prior study and without associated spinal stenosis. Mild uncovertebral spurring and facet arthrosis without significant neural foraminal stenosis. C3-4: Mild disc space narrowing. Disc bulging, uncovertebral spurring, and moderate right and mild left facet arthrosis result in moderate spinal stenosis and moderate to severe right and mild-to-moderate left neural foraminal stenosis, progressed from prior. C4-5: Mild disc space narrowing. Disc bulging, a central/left central  disc protrusion, uncovertebral spurring, and mild facet arthrosis result in moderate spinal stenosis with mild cord flattening and moderate to severe bilateral  neural foraminal stenosis, progressed from prior. C5-6: Interval ACDF. Patent spinal canal. Mild-to-moderate bilateral neural foraminal stenosis due to uncovertebral spurring. C6-7: Interval ACDF. Patent spinal canal. Mild-to-moderate bilateral neural foraminal stenosis due to uncovertebral spurring. C7-T1: Minimal disc bulging and uncovertebral spurring without stenosis. IMPRESSION: 1. C5-C7 ACDF without residual spinal stenosis. 2. Progressive disc degeneration at C3-4 and C4-5 with moderate spinal stenosis and moderate to severe neural foraminal stenosis. Electronically Signed: By: Logan Bores M.D. On: 09/15/2021 14:26   MR THORACIC SPINE WO CONTRAST  Result Date: 09/15/2021 CLINICAL DATA:  Ataxia, nontraumatic, thoracic pathology suspected. Left-sided tingling. EXAM: MRI THORACIC SPINE WITHOUT CONTRAST TECHNIQUE: Multiplanar, multisequence MR imaging of the thoracic spine was performed. No intravenous contrast was administered. COMPARISON:  None Available. FINDINGS: The study is motion degraded, including moderate motion on the axial T2 spin echo sequence. Alignment: Straightening of the normal thoracic kyphosis. No listhesis. Vertebrae: No fracture or suspicious marrow lesion. Degenerative endplate changes at V40-08 including minimal degenerative edema. Cord: No cord signal abnormality identified within limitations of motion artifact. Paraspinal and other soft tissues: Unremarkable. Disc levels: T1-2 and T2-3: Negative. T3-4: Disc bulging without significant stenosis. T4-5: Small left paracentral disc protrusion and mild facet arthrosis without significant stenosis. T5-6: Disc bulging and moderate facet arthrosis result in mild bilateral neural foraminal stenosis without significant spinal stenosis. T6-7: Small right paracentral disc protrusion without significant stenosis. T7-8: A right central disc protrusion result in moderate spinal stenosis with mild cord flattening. Mild facet arthrosis. No  significant neural foraminal stenosis. T8-9: Disc bulging and a central disc protrusion result in mild spinal stenosis without significant neural foraminal stenosis. T9-10: Disc bulging and severe facet arthrosis result in moderate right greater than left neural foraminal stenosis without spinal stenosis. T10-11: Right eccentric disc bulging and mild-to-moderate facet arthrosis result in mild spinal stenosis and moderate right neural foraminal stenosis. T11-12: Negative. T12-L1: A broad posterior disc protrusion results in borderline spinal stenosis without neural foraminal stenosis. IMPRESSION: 1. Motion degraded examination. 2. Moderate spinal stenosis at T7-8 due to a disc protrusion. 3. Mild spinal stenosis at T8-9 and T10-11. 4. Moderate neural foraminal stenosis bilaterally at T9-10 and on the right at T10-11. Electronically Signed   By: Logan Bores M.D.   On: 09/15/2021 14:33   DG Chest Port 1 View  Result Date: 09/15/2021 CLINICAL DATA:  A 71 year old male presents for evaluation of bradycardia. EXAM: PORTABLE CHEST 1 VIEW COMPARISON:  May 02, 2011 FINDINGS: EKG leads project over the chest. Cardiomediastinal contours and hilar structures are normal. Lungs are clear. No acute skeletal process on limited assessment. IMPRESSION: No acute cardiopulmonary disease. Electronically Signed   By: Zetta Bills M.D.   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

## 2021-09-16 NOTE — Care Management CC44 (Signed)
Condition Code 44 Documentation Completed  Patient Details  Name: KHYRIN TREVATHAN MRN: [id number] Date of Birth: 05/11/1950   Condition Code 44 given:  Yes Patient signature on Condition Code 44 notice:  Yes Documentation of 2 MD's agreement:  Yes Code 44 added to claim:  Yes    Carles Collet, RN 09/16/2021, 2:34 PM

## 2021-09-16 NOTE — Consult Note (Signed)
Neurology Consultation Reason for Consult: Stroke on MRI Requesting Physician: Orma Flaming   CC: Left face and arm tingling   History is obtained from:   HPI: [name] is a 71 y.o. male with a past medical history significant for hypertension, hyperlipidemia, diabetes, obstructive sleep apnea managed with nasal surgery, overweight (BMI 26.38), former smoking, C5-7 ACDF  He reports he went to bed without any symptoms on 5/25, at about 10 PM, and then woke 6:30 AM on 5/26 with new onset tingling of the left face and arm.  He came to the ED for further evaluation and was found to have asymptomatic heart block in addition to a right thalamic stroke.  He notes that he has not had any medication changes recently, including no adjustment of his pravastatin after his LDL resulted as 121 on 07/31/2021  Regarding his sleep apnea, his wife notes that he still snores but not as badly as before he had the surgery and he does not have any witnessed apneas.  He attributes his excessive daytime sleepiness to blood pressure medications but otherwise no obvious symptoms of OSA  LKW: 10 PM on 5/25 tPA given?: No, out of the window Premorbid modified rankin scale:      0 - No symptoms.  ROS: All other review of systems was negative except as noted in the HPI.  Past Medical History:  Diagnosis Date   DDD (degenerative disc disease), cervical    Diabetes mellitus without complication ([place])    History of chicken pox    History of skin cancer    Dr.Drew Ronnald Ramp   Hx of adenomatous and sessile serrated colonic polyps 03/21/2017   Hyperlipidemia    [place] Study LDL goal = <130   Hypertension    Metabolic syndrome    Rosacea    Sleep apnea    does not use CPAP machine; treated with prev nasal surgery   Past Surgical History:  Procedure Laterality Date   CERVICAL FUSION     COLONOSCOPY  04/23/2004   Neg   COLONOSCOPY  2018   Dr.Gessner   LUMBAR LAMINECTOMY/DECOMPRESSION MICRODISCECTOMY   05/09/2011   Procedure: LUMBAR LAMINECTOMY/DECOMPRESSION MICRODISCECTOMY;  Surgeon: Magnus Sinning, MD;  Location: [facility];  Service: Orthopedics;  Laterality: Left;  Decompressive laminectomy L2-3,  L3- 4,  L4-5 left, central    NASAL SINUS SURGERY     Current Outpatient Medications  Medication Instructions   Alcohol Swabs 70 % PADS Use as directed to check sugar. E11.9.   benazepril (LOTENSIN) 20 mg, Oral, Daily   Choline Fenofibrate (FENOFIBRIC ACID) 135 MG CPDR TAKE 1 CAPSULE BY MOUTH EVERY DAY   folic acid (FOLVITE) 1 mg, Oral, Daily   glucose blood test strip USE DAILY TO CHECK SUGAR   hydrocortisone 2.5 % lotion 1 application., Topical, 2 times daily, Apply to face   Lancets (ONETOUCH ULTRASOFT) lancets Use as instructed to check sugar daily if needed.  E11.9.   metFORMIN (GLUCOPHAGE) 500 MG tablet TAKE 1-2 TABLETS BY MOUTH IN THE MORNING AND 1-2 IN THE EVENING   metoprolol tartrate (LOPRESSOR) 50 mg, Oral, 2 times daily   pravastatin (PRAVACHOL) 20 mg, Oral, Daily   Soft Lens Products (REFRESH CONTACTS DROPS) SOLN 1 drop, Both Eyes, Daily     Family History  Problem Relation Age of Onset   Alzheimer's disease Mother    Heart disease Father        Pacemaker   Heart disease Other    Colon cancer Neg  Hx    Prostate cancer Neg Hx    Stomach cancer Neg Hx    Colon polyps Neg Hx    Esophageal cancer Neg Hx    Rectal cancer Neg Hx    Social History:  reports that he quit smoking about 50 years ago. His smoking use included cigarettes. He has never used smokeless tobacco. He reports that he does not drink alcohol and does not use drugs.  Exam: Current vital signs: BP (!) 156/81 (BP Location: Right Arm)   Pulse 79   Temp 98.5 F (36.9 C) (Oral)   Resp 15   Ht '5\' 8"'$  (1.727 m)   Wt 78.7 kg   SpO2 98%   BMI 26.38 kg/m  Vital signs in last 24 hours: Temp:  [98.3 F (36.8 C)-99 F (37.2 C)] 98.5 F (36.9 C) (05/26 2316) Pulse Rate:  [29-82] 79 (05/26 2316) Resp:   [11-17] 15 (05/27 0000) BP: (152-188)/(65-95) 156/81 (05/26 2316) SpO2:  [97 %-100 %] 98 % (05/26 2316) Weight:  [78.7 kg-82.1 kg] 78.7 kg (05/26 1828)   Physical Exam  Constitutional: Appears well-developed and well-nourished.  Psych: Affect appropriate to situation, calm and cooperative Eyes: No scleral injection HENT: No oropharyngeal obstruction.  MSK: no joint deformities.  Cardiovascular: Normal rate and regular rhythm. Perfusing extremities well Respiratory: Effort normal, non-labored breathing GI: Soft.  No distension. There is no tenderness.  Skin: Warm dry and intact visible skin  Neuro: Mental Status: Patient is awake, alert, oriented to person, place, month, year, and situation. Patient is able to give a clear and coherent history. No signs of aphasia or neglect Cranial Nerves: II: Visual Fields are full. Pupils are equal, round, and reactive to light.  Posterior it does not III,IV, VI: EOMI without ptosis or diploplia.  V: Facial sensation is symmetric to temperature VII: Facial movement is symmetric.  VIII: hearing is intact to voice X: Uvula elevates symmetrically XI: Shoulder shrug is symmetric. XII: tongue is midline without atrophy or fasciculations.  Motor: Tone is normal. Bulk is normal. 5/5 strength was present in all four extremities.  Sensory: Sensation is reduced on the left face in all 3 distributions (V1, V2, V3), as well as the left arm, with tingling in these areas.  He reports the left leg is not symptomatic at this time Deep Tendon Reflexes: 3+ and symmetric in the brachioradialis and patellae.  Cerebellar: FNF and HKS are intact bilaterally Gait:  Deferred   NIHSS total 1 Score breakdown: Sensory change Performed at 5:15 AM on 5/27   I have reviewed labs in epic and the results pertinent to this consultation are:  Basic Metabolic Panel: Recent Labs  Lab 09/15/21 0851  NA 136  K 4.1  CL 106  CO2 20*  GLUCOSE 228*  BUN 22   CREATININE 1.06  CALCIUM 9.3    CBC: Recent Labs  Lab 09/15/21 0851  WBC 6.6  NEUTROABS 4.6  HGB 14.5  HCT 42.3  MCV 93.2  PLT 223    Coagulation Studies: No results for input(s): LABPROT, INR in the last 72 hours.   Lab Results  Component Value Date   CHOL 196 07/31/2021   HDL 40.20 07/31/2021   LDLCALC 122 (H) 07/31/2021   LDLDIRECT 127.0 07/07/2020   TRIG 169.0 (H) 07/31/2021   CHOLHDL 5 07/31/2021   Lab Results  Component Value Date   HGBA1C 7.1 (H) 07/31/2021     Unresulted Labs (From admission, onward)     Start  Ordered   09/16/21 0500  Lipid panel  Tomorrow morning,   R        09/15/21 1709   09/16/21 0459  Hemoglobin A1c  Add-on,   AD       Question:  Specimen collection method  Answer:  Lab=Lab collect   09/16/21 0458              I have reviewed the images obtained:  MRI brain personally reviewed, agree with radiology there is an acute right thalamic stroke 1. Motion degraded examination. 2. Moderate spinal stenosis at T7-8 due to a disc protrusion. 3. Mild spinal stenosis at T8-9 and T10-11. 4. Moderate neural foraminal stenosis bilaterally at T9-10 and on the right at T10-11.  MRI C-spine and T spine personally reviewed, agree with radiology: 1. C5-C7 ACDF without residual spinal stenosis. 2. Progressive disc degeneration at C3-4 and C4-5 with moderate spinal stenosis and moderate to severe neural foraminal stenosis. 3. A 1.6 cm lesion posterior to the left submandibular gland, indeterminate for a cyst, small venolymphatic malformation, or other mass. Correlate with physical examination and consider contrast-enhanced neck CT for further evaluation.  CTA: 1. 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 2. Severe right and mild left paraclinoid ICA stenosis. 3. Severe bilateral A3 ACA stenosis. 4. Approximately 50% left and 45% right ICA origin stenosis in the neck.  Impression: This is a 71 year old male with  multiple small vessel risk factors presenting with a small vessel stroke.  The etiology may also be atheroembolic from his stenotic posterior cerebral arteries, but the perforators supplying the thalamus are likely proximal to the atherosclerotic disease.  Location is less likely to be cardioembolic, but given his new heart block, reasonable to proceed with echocardiogram  Recommendations: #Right thalamic stroke - Stroke labs HgbA1c, fasting lipid panel - Frequent neuro checks - Echocardiogram - Prophylactic therapy-Antiplatelet med: Aspirin - dose '325mg'$  PO or '300mg'$  PR, followed by 81 mg daily - Plavix 300 mg load with 75 mg daily, duration per stroke team  - Risk factor modification - Telemetry monitoring - Blood pressure goal   - Permissive hypertension to 220/120 for now - PT consult, OT consult, Speech consult, not needed given tingling is the only symptom - Stroke team to follow  [place] [phone] Available 7 PM to 7 AM, outside of these hours please call Neurologist on call as listed on Amion.

## 2021-09-16 NOTE — Progress Notes (Signed)
  Echocardiogram 2D Echocardiogram has been performed.  Merrie Roof F 09/16/2021, 10:23 AM

## 2021-09-16 NOTE — Evaluation (Signed)
Physical Therapy Evaluation and Discharge Patient Details Name: [name] MRN: [id number] DOB: [birth year] Today's Date: 09/16/2021  History of Present Illness  71 y.o. male who presented to ED 09/15/21 with complaints of tingling in his hand and foot and felt funny walking. MRI brain: acute right thalamic infarct PMH significant for hypertension, hyperlipidemia, diabetes, obstructive sleep apnea managed with nasal surgery, overweight (BMI 26.38), former smoking, C5-7 ACDF  Clinical Impression   Patient evaluated by Physical Therapy with no further acute PT needs identified. Patient scored 55/56 on Berg Balance assessment.  PT is signing off. Thank you for this referral.        Recommendations for follow up therapy are one component of a multi-disciplinary discharge planning process, led by the attending physician.  Recommendations may be updated based on patient status, additional functional criteria and insurance authorization.  Follow Up Recommendations No PT follow up    Assistance Recommended at Discharge None  Patient can return home with the following       Equipment Recommendations None recommended by PT  Recommendations for Other Services       Functional Status Assessment Patient has not had a recent decline in their functional status     Precautions / Restrictions Precautions Precautions: None      Mobility  Bed Mobility Overal bed mobility: Independent                  Transfers Overall transfer level: Independent                      Ambulation/Gait Ambulation/Gait assistance: Independent Gait Distance (Feet): 250 Feet Assistive device: None Gait Pattern/deviations: WFL(Within Functional Limits)   Gait velocity interpretation: >2.62 ft/sec, indicative of community ambulatory      Stairs Stairs: Yes Stairs assistance: Modified independent (Device/Increase time) Stair Management: One rail Right, Forwards Number of Stairs: 5     Wheelchair Mobility    Modified Rankin (Stroke Patients Only) Modified Rankin (Stroke Patients Only) Pre-Morbid Rankin Score: No symptoms Modified Rankin: No significant disability     Balance                                 Standardized Balance Assessment Standardized Balance Assessment : Berg Balance Test Berg Balance Test Sit to Stand: Able to stand without using hands and stabilize independently Standing Unsupported: Able to stand safely 2 minutes Sitting with Back Unsupported but Feet Supported on Floor or Stool: Able to sit safely and securely 2 minutes Stand to Sit: Sits safely with minimal use of hands Transfers: Able to transfer safely, minor use of hands Standing Unsupported with Eyes Closed: Able to stand 10 seconds safely Standing Ubsupported with Feet Together: Able to place feet together independently and stand 1 minute safely From Standing, Reach Forward with Outstretched Arm: Can reach confidently >25 cm (10") From Standing Position, Pick up Object from Floor: Able to pick up shoe safely and easily From Standing Position, Turn to Look Behind Over each Shoulder: Looks behind from both sides and weight shifts well Turn 360 Degrees: Able to turn 360 degrees safely in 4 seconds or less Standing Unsupported, Alternately Place Feet on Step/Stool: Able to stand independently and safely and complete 8 steps in 20 seconds Standing Unsupported, One Foot in Front: Able to place foot tandem independently and hold 30 seconds Standing on One Leg: Able to lift leg independently and hold 5-10 seconds  Total Score: 55         Pertinent Vitals/Pain Pain Assessment Pain Assessment: No/denies pain    Home Living Family/patient expects to be discharged to:: Private residence Living Arrangements: Spouse/significant other Available Help at Discharge: Family;Available 24 hours/day Type of Home: House (townhouse) Home Access: Stairs to enter Entrance Stairs-Rails:  None Entrance Stairs-Number of Steps: 1   Home Layout: One level Home Equipment: None      Prior Function Prior Level of Function : Independent/Modified Independent;Working/employed;Driving             Mobility Comments: works at [employer], hospital        Extremity/Trunk Assessment   Upper Extremity Assessment Upper Extremity Assessment: Overall WFL for tasks assessed (reports mild numbness LUE)    Lower Extremity Assessment Lower Extremity Assessment: Overall WFL for tasks assessed (reports mild numbness LLE; strength 5/5)    Cervical / Trunk Assessment Cervical / Trunk Assessment: Normal  Communication   Communication: No difficulties  Cognition Arousal/Alertness: Awake/alert Behavior During Therapy: WFL for tasks assessed/performed Overall Cognitive Status: Within Functional Limits for tasks assessed                                          General Comments      Exercises     Assessment/Plan    PT Assessment Patient does not need any further PT services  PT Problem List         PT Treatment Interventions      PT Goals (Current goals can be found in the Care Plan section)  Acute Rehab PT Goals PT Goal Formulation: All assessment and education complete, DC therapy    Frequency       Co-evaluation               AM-PAC PT "6 Clicks" Mobility  Outcome Measure Help needed turning from your back to your side while in a flat bed without using bedrails?: None Help needed moving from lying on your back to sitting on the side of a flat bed without using bedrails?: None Help needed moving to and from a bed to a chair (including a wheelchair)?: None Help needed standing up from a chair using your arms (e.g., wheelchair or bedside chair)?: None Help needed to walk in hospital room?: None Help needed climbing 3-5 steps with a railing? : None 6 Click Score: 24    End of Session Equipment Utilized During Treatment:  Gait belt Activity Tolerance: Patient tolerated treatment well Patient left: in bed;with call bell/phone within reach;with family/visitor present Nurse Communication: Mobility status PT Visit Diagnosis: Unsteadiness on feet (R26.81)    Time: 7517-0017 PT Time Calculation (min) (ACUTE ONLY): 11 min   Charges:   PT Evaluation $PT Eval Low Complexity: [place], PT Acute Rehabilitation Services  Pager [phone] Office [phone]   Rexanne Mano 09/16/2021, 12:42 PM

## 2021-09-16 NOTE — Care Management Obs Status (Signed)
[facility] NOTIFICATION   Patient Details  Name: [name] MRN: [id number] Date of Birth: 1951/03/30   Medicare Observation Status Notification Given:  Yes    Carles Collet, RN 09/16/2021, 2:34 PM

## 2021-09-16 NOTE — Discharge Summary (Signed)
Weight, physician Discharge Summary  DEMETRIOS BYRON IWL:[id number] DOB: [birth year] DOA: 09/15/2021  PCP: Tonia Ghent, MD  Admit date: 09/15/2021 Discharge date: 09/16/2021  Time spent: 38 minutes  Recommendations for Outpatient Follow-up:  Need labs an 1 week Will need follow-up with outpatient cardiologist for his block with Wenckebach and will need outpatient monitor I will CC Dr. Johnsie Cancel to ensure that this is followed up on No PT OT New medications-Crestor-ASA + Plavix as per recommendations  Discharge Diagnoses:  MAIN problem for hospitalization   Stroke Wenckebach block   Please see below for itemized issues addressed in HOpsital- refer to other progre ss notes for clarity if needed  Discharge Condition: Improved  Diet recommendation: Heart healthy diabetic  Filed Weights   09/15/21 0818 09/15/21 1828  Weight: 82.1 kg 78.7 kg    History of present illness:   71 year old male Prior spinal stenosis L2-3-4-5 + disc herniation L3-4 left status post decompressive laminectomy 05/09/2011 Dr. 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 Johnsie Cancel recommended a monitor to be followed up on in the future and less likely will be mailed to the patient  Stroke work-up is complete-triglycerides and cholesterol were very elevated and Crestor substituted for Pravachol-we also recommended to the patient aspirin and Plavix for 21 days total and then transition to aspirin alone after the 21st  day  Patient had a mass in the submandibular region that we discussed-this will need to be followed up by PCP with repeat imaging versus ENT referral in 4 to 6 weeks as no likely procedure will be done before that time  Patient was discharged in a stable state  Discharge Exam: Vitals:   09/16/21 0752 09/16/21 1224  BP: (!) 141/72 (!) 154/86  Pulse: 80 82  Resp:  18  Temp: 98 F (36.7 C) 98.5 F (36.9 C)  SpO2: 97% 97%    Subj on day of d/c   Awake coherent alert still has some tingling in the left hand has ambulated with PT OT has no deficits really He feels stronger He is eating and drinking and coherent   General Exam on discharge  EOMI NCAT neck soft supple no bruit S1-S2 no murmur does have heart block on monitors Abdomen soft no rebound no guarding Finger-nose-finger is intact power is 5/5 shoulder shrug is intact vision by direct confrontation is intact external ocular movements are intact Straight leg raise as well as quadriceps hamstring and calf musculature are intact-Babinski's can downgoing Praxis prosody are all good  Patient stable from a psychiatric perspective in addition  Discharge Instructions   Discharge Instructions     Ambulatory referral to Neurology   Complete by: As directed    Follow up with stroke clinic NP (Jessica Vanschaick or Cecille Rubin, if both not available, consider Zachery Dauer, or Ahern) at [facility] in about 4 weeks. Thanks.   Diet - low sodium heart healthy   Complete by: As directed    Discharge instructions   Complete by: As directed    Continue your blood pressure medicine  but only as indicated and stop the metoprolol because of the funny heartbeats that you had I would recommend you resume your metformin tomorrow You will need to be on both aspirin and Plavix for 21 days and then after 20 more days of Plavix you can continue aspirin only for secondary prevention of stroke Noticed that we have discontinued your Pravachol and  substituted for stronger cholesterol medication as you had a stroke You will also need follow-up for the mass in your left neck-your primary care physician as we had mentioned should be able to set you up with an ENT physician in about 4 to 6 weeks if he feels that there is a need versus getting another scan I would recommend that you get labs in about 1 week at your primary care physician's office It would be preferable to also get a 1 month appointment with Dr. Erlinda Hong of [facility] neurology   Increase activity slowly   Complete by: As directed    Increase activity slowly   Complete by: As directed       Allergies as of 09/16/2021   No Known Allergies      Medication List     STOP taking these medications    glucose blood test strip   hydrocortisone 2.5 % lotion   metoprolol tartrate 50 MG tablet Commonly known as: LOPRESSOR   pravastatin 20 MG tablet Commonly known as: PRAVACHOL       TAKE these medications    Alcohol Swabs 70 % Pads Use as directed to check sugar. E11.9.   aspirin 81 MG chewable tablet Chew 1 tablet (81 mg total) by mouth daily. Start taking on: Sep 17, 2021   benazepril 20 MG tablet Commonly known as: LOTENSIN Take 1 tablet (20 mg total) by mouth daily. Start taking on: Sep 19, 2021 What changed: These instructions start on Sep 19, 2021. If you are unsure what to do until then, ask your doctor or other care provider.   clopidogrel 75 MG tablet Commonly known as: PLAVIX Take 1 tablet (75 mg total) by mouth daily for 20 days. Start taking on: Sep 17, 2021   Fenofibric Acid 135 MG Cpdr TAKE 1 CAPSULE BY MOUTH EVERY DAY What changed: how much to take   folic acid 1 MG tablet Commonly known as: FOLVITE Take 1 tablet (1 mg total) by mouth daily.   metFORMIN 500 MG tablet Commonly known as: GLUCOPHAGE TAKE 1-2 TABLETS BY MOUTH IN THE MORNING AND 1-2 IN THE EVENING What changed: See the new instructions.   onetouch ultrasoft lancets Use as  instructed to check sugar daily if needed.  E11.9.   Refresh Contacts Drops Soln Place 1 drop into both eyes daily.   rosuvastatin 20 MG tablet Commonly known as: CRESTOR Take 1 tablet (20 mg total) by mouth at bedtime.       No Known Allergies  Follow-up Information     [facility]. Schedule an appointment as soon as possible for a visit in 1 month(s).   Specialty: Neurology Why: stroke clinic Contact information: [address] [place] [phone]                 The results of significant diagnostics from this hospitalization (including imaging, microbiology, ancillary and laboratory) are listed below for reference.    Significant Diagnostic Studies: CT ANGIO HEAD NECK W WO CM  Result Date: 09/15/2021 CLINICAL DATA:  Neuro deficit, acute, stroke suspected EXAM: CT ANGIOGRAPHY  HEAD AND NECK TECHNIQUE: Multidetector CT imaging of the head and neck was performed using the standard protocol during bolus administration of intravenous contrast. Multiplanar CT image reconstructions and MIPs were obtained to evaluate the vascular anatomy. Carotid stenosis measurements (when applicable) are obtained utilizing NASCET criteria, using the distal internal carotid diameter as the denominator. RADIATION DOSE REDUCTION: This exam was performed according to the departmental dose-optimization program which includes automated exposure control, adjustment of the mA and/or kV according to patient size and/or use of iterative reconstruction technique. CONTRAST:  23m OMNIPAQUE IOHEXOL 350 MG/ML SOLN COMPARISON:  MRI head from the same day. FINDINGS: CT HEAD FINDINGS Brain: Small right thalamic infarct, better characterized on same day MRI. No evidence of acute hemorrhage, hydrocephalus, extra-axial collection or mass lesion/mass effect. Vascular: Detailed below. Skull: No acute fracture. Sinuses: Visualized sinuses are clear. Orbits: No acute  finding. Review of the MIP images confirms the above findings CTA NECK FINDINGS Aortic arch: Great vessel origins are patent without significant stenosis. Right carotid system: Atherosclerosis at the carotid bifurcation with approximately 45% stenosis of the ICA origin. Left carotid system: Atherosclerosis at the carotid bifurcation with approximately 50% stenosis of the ICA origin. Vertebral arteries: Mildly left dominant. Patent bilaterally without significant (greater than 50%) stenosis. Skeleton: C5-C7 ACDF with solid bony fusion. Other neck: No acute findings. Upper chest: Mild biapical pleuroparenchymal scarring. Otherwise, visualized lung apices are clear. Review of the MIP images confirms the above findings CTA HEAD FINDINGS Anterior circulation: Atherosclerosis of bilateral intracranial ICAs. Severe right and mild left paraclinoid ICA stenosis. Bilateral MCAs and ACAs are patent. Suspect severe bilateral A3 ACA stenosis. Right M1 MCAs small with early bifurcation. No aneurysm identified. Posterior circulation: Bilateral intradural vertebral arteries, basilar artery and posterior cerebral arteries are patent. 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 Venous sinuses: As permitted by contrast timing, patent. Review of the MIP images confirms the above findings IMPRESSION: 1. 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 2. Severe right and mild left paraclinoid ICA stenosis. 3. Severe bilateral A3 ACA stenosis. 4. Approximately 50% left and 45% right ICA origin stenosis in the neck. Electronically Signed   By: FMargaretha SheffieldM.D.   On: 09/15/2021 17:47   MR BRAIN WO CONTRAST  Result Date: 09/15/2021 CLINICAL DATA:  Neuro deficit, acute, stroke suspected. Left-sided tingling. EXAM: MRI HEAD WITHOUT CONTRAST TECHNIQUE: Multiplanar, multiecho pulse sequences of the brain and surrounding structures were obtained without intravenous contrast. COMPARISON:  None Available. FINDINGS:  Brain: There is a 7 mm acute infarct involving the lateral, inferior aspect of the right thalamus. No intracranial hemorrhage, mass, midline shift, or extra-axial fluid collection is identified. No significant chronic white matter disease is evident. There is mild generalized cerebral atrophy. Vascular: Major intracranial vascular flow voids are preserved. Skull and upper cervical spine: Unremarkable bone marrow signal. Sinuses/Orbits: Unremarkable orbits. Trace fluid in the right sphenoid sinus. Clear mastoid air cells. Other: None. IMPRESSION: Acute right thalamic infarct. Electronically Signed   By: ALogan BoresM.D.   On: 09/15/2021 14:13   MR Cervical Spine Wo Contrast  Addendum Date: 09/15/2021   ADDENDUM REPORT: 09/15/2021 14:34 ADDENDUM: 1.6 cm lesion posterior to the left submandibular gland, indeterminate for a cyst, small venolymphatic malformation, or other mass. Correlate with physical examination and consider contrast-enhanced neck CT for further evaluation. Electronically Signed   By: ALogan BoresM.D.   On: 09/15/2021 14:34   Result Date: 09/15/2021 CLINICAL DATA:  Ataxia, nontraumatic, cervical pathology suspected. Left-sided  tingling. EXAM: MRI CERVICAL SPINE WITHOUT CONTRAST TECHNIQUE: Multiplanar, multisequence MR imaging of the cervical spine was performed. No intravenous contrast was administered. COMPARISON:  Cervical spine MRI 11/18/2008 FINDINGS: Alignment: Chronic straightening of the normal cervical lordosis. No significant listhesis. Vertebrae: No fracture, suspicious marrow lesion, or significant marrow edema. Interval C5-C7 ACDF with evidence of solid arthrodesis. Cord: Normal signal. Posterior Fossa, vertebral arteries, paraspinal tissues: 1.6 cm homogeneously T2 hyperintense lesion posterior to the left submandibular gland. Preserved vertebral artery flow voids. Disc levels: C2-3: Small central disc protrusion, less prominent than on the prior study and without associated spinal  stenosis. Mild uncovertebral spurring and facet arthrosis without significant neural foraminal stenosis. C3-4: Mild disc space narrowing. Disc bulging, uncovertebral spurring, and moderate right and mild left facet arthrosis result in moderate spinal stenosis and moderate to severe right and mild-to-moderate left neural foraminal stenosis, progressed from prior. C4-5: Mild disc space narrowing. Disc bulging, a central/left central disc protrusion, uncovertebral spurring, and mild facet arthrosis result in moderate spinal stenosis with mild cord flattening and moderate to severe bilateral neural foraminal stenosis, progressed from prior. C5-6: Interval ACDF. Patent spinal canal. Mild-to-moderate bilateral neural foraminal stenosis due to uncovertebral spurring. C6-7: Interval ACDF. Patent spinal canal. Mild-to-moderate bilateral neural foraminal stenosis due to uncovertebral spurring. C7-T1: Minimal disc bulging and uncovertebral spurring without stenosis. IMPRESSION: 1. C5-C7 ACDF without residual spinal stenosis. 2. Progressive disc degeneration at C3-4 and C4-5 with moderate spinal stenosis and moderate to severe neural foraminal stenosis. Electronically Signed: By: Logan Bores M.D. On: 09/15/2021 14:26   MR THORACIC SPINE WO CONTRAST  Result Date: 09/15/2021 CLINICAL DATA:  Ataxia, nontraumatic, thoracic pathology suspected. Left-sided tingling. EXAM: MRI THORACIC SPINE WITHOUT CONTRAST TECHNIQUE: Multiplanar, multisequence MR imaging of the thoracic spine was performed. No intravenous contrast was administered. COMPARISON:  None Available. FINDINGS: The study is motion degraded, including moderate motion on the axial T2 spin echo sequence. Alignment: Straightening of the normal thoracic kyphosis. No listhesis. Vertebrae: No fracture or suspicious marrow lesion. Degenerative endplate changes at D97-41 including minimal degenerative edema. Cord: No cord signal abnormality identified within limitations of  motion artifact. Paraspinal and other soft tissues: Unremarkable. Disc levels: T1-2 and T2-3: Negative. T3-4: Disc bulging without significant stenosis. T4-5: Small left paracentral disc protrusion and mild facet arthrosis without significant stenosis. T5-6: Disc bulging and moderate facet arthrosis result in mild bilateral neural foraminal stenosis without significant spinal stenosis. T6-7: Small right paracentral disc protrusion without significant stenosis. T7-8: A right central disc protrusion result in moderate spinal stenosis with mild cord flattening. Mild facet arthrosis. No significant neural foraminal stenosis. T8-9: Disc bulging and a central disc protrusion result in mild spinal stenosis without significant neural foraminal stenosis. T9-10: Disc bulging and severe facet arthrosis result in moderate right greater than left neural foraminal stenosis without spinal stenosis. T10-11: Right eccentric disc bulging and mild-to-moderate facet arthrosis result in mild spinal stenosis and moderate right neural foraminal stenosis. T11-12: Negative. T12-L1: A broad posterior disc protrusion results in borderline spinal stenosis without neural foraminal stenosis. IMPRESSION: 1. Motion degraded examination. 2. Moderate spinal stenosis at T7-8 due to a disc protrusion. 3. Mild spinal stenosis at T8-9 and T10-11. 4. Moderate neural foraminal stenosis bilaterally at T9-10 and on the right at T10-11. Electronically Signed   By: Logan Bores M.D.   On: 09/15/2021 14:33   DG Chest Port 1 View  Result Date: 09/15/2021 CLINICAL DATA:  A 71 year old male presents for evaluation of bradycardia. EXAM: PORTABLE CHEST 1  VIEW COMPARISON:  May 02, 2011 FINDINGS: EKG leads project over the chest. Cardiomediastinal contours and hilar structures are normal. Lungs are clear. No acute skeletal process on limited assessment. IMPRESSION: No acute cardiopulmonary disease. Electronically Signed   By: Zetta Bills M.D.   On:  09/15/2021 09:57   ECHOCARDIOGRAM COMPLETE  Result Date: 09/16/2021    ECHOCARDIOGRAM REPORT   Patient Name:   [name] [facility] Date of Exam: 09/16/2021 Medical Rec #:  [id number]        Height:       68.0 in Accession #:    [id number]       Weight:       173.5 lb Date of Birth:  03/11/1951       BSA:          1.924 m Patient Age:    7 years         BP:           141/72 mmHg Patient Gender: M                HR:           55 bpm. Exam Location:  Inpatient Procedure: 2D Echo, Cardiac Doppler and Color Doppler Indications:    Stroke  History:        Patient has no prior history of Echocardiogram examinations.  Sonographer:    Merrie Roof RDCS Referring Phys: [id number] [place]  1. Left ventricular ejection fraction, by estimation, is 60 to 65%. The left ventricle has normal function. The left ventricle has no regional wall motion abnormalities. Left ventricular diastolic parameters are consistent with Grade I diastolic dysfunction (impaired relaxation).  2. Right ventricular systolic function is normal. The right ventricular size is normal.  3. The mitral valve is normal in structure. Trivial mitral valve regurgitation. No evidence of mitral stenosis.  4. Calcified non coronary cusp mean gradient across valve 13 mmHg but calculated AVA 2.6 cm2 and DVI 0.79. The aortic valve was not well visualized. There is moderate calcification of the aortic valve. There is moderate thickening of the aortic valve. Aortic valve regurgitation is not visualized. Aortic valve sclerosis/calcification is present, without any evidence of aortic stenosis.  5. The inferior vena cava is normal in size with greater than 50% respiratory variability, suggesting right atrial pressure of 3 mmHg. FINDINGS  Left Ventricle: Left ventricular ejection fraction, by estimation, is 60 to 65%. The left ventricle has normal function. The left ventricle has no regional wall motion abnormalities. The left ventricular internal cavity size was  normal in size. There is  no left ventricular hypertrophy. Left ventricular diastolic parameters are consistent with Grade I diastolic dysfunction (impaired relaxation). Right Ventricle: The right ventricular size is normal. No increase in right ventricular wall thickness. Right ventricular systolic function is normal. Left Atrium: Left atrial size was normal in size. Right Atrium: Right atrial size was normal in size. Pericardium: There is no evidence of pericardial effusion. Mitral Valve: The mitral valve is normal in structure. Trivial mitral valve regurgitation. No evidence of mitral valve stenosis. Tricuspid Valve: The tricuspid valve is normal in structure. Tricuspid valve regurgitation is not demonstrated. No evidence of tricuspid stenosis. Aortic Valve: Calcified non coronary cusp mean gradient across valve 13 mmHg but calculated AVA 2.6 cm2 and DVI 0.79. The aortic valve was not well visualized. There is moderate calcification of the aortic valve. There is moderate thickening of the aortic valve. Aortic valve regurgitation is not visualized. Aortic valve  sclerosis/calcification is present, without any evidence of aortic stenosis. Aortic valve mean gradient measures 10.0 mmHg. Aortic valve peak gradient measures 18.5 mmHg. Aortic valve area, by VTI measures 2.50 cm. Pulmonic Valve: The pulmonic valve was normal in structure. Pulmonic valve regurgitation is not visualized. No evidence of pulmonic stenosis. Aorta: The aortic root is normal in size and structure. Venous: The inferior vena cava is normal in size with greater than 50% respiratory variability, suggesting right atrial pressure of 3 mmHg. IAS/Shunts: The interatrial septum was not well visualized.  LEFT VENTRICLE PLAX 2D LVIDd:         5.00 cm   Diastology LVIDs:         3.40 cm   LV e' medial:    9.17 cm/s LV PW:         1.10 cm   LV E/e' medial:  6.3 LV IVS:        0.90 cm   LV e' lateral:   12.70 cm/s LVOT diam:     2.00 cm   LV E/e' lateral:  4.6 LV SV:         99 LV SV Index:   52 LVOT Area:     3.14 cm  RIGHT VENTRICLE RV S prime:     15.90 cm/s TAPSE (M-mode): 1.8 cm LEFT ATRIUM             Index        RIGHT ATRIUM           Index LA diam:        3.10 cm 1.61 cm/m   RA Area:     11.90 cm LA Vol (A2C):   51.4 ml 26.72 ml/m  RA Volume:   30.00 ml  15.59 ml/m LA Vol (A4C):   61.3 ml 31.86 ml/m LA Biplane Vol: 58.7 ml 30.51 ml/m  AORTIC VALVE AV Area (Vmax):    2.32 cm AV Area (Vmean):   2.26 cm AV Area (VTI):     2.50 cm AV Vmax:           215.00 cm/s AV Vmean:          139.500 cm/s AV VTI:            0.398 m AV Peak Grad:      18.5 mmHg AV Mean Grad:      10.0 mmHg LVOT Vmax:         159.00 cm/s LVOT Vmean:        100.250 cm/s LVOT VTI:          0.316 m LVOT/AV VTI ratio: 0.79  AORTA Ao Root diam: 3.30 cm MITRAL VALVE MV Area (PHT): 2.76 cm    SHUNTS MV Decel Time: 275 msec    Systemic VTI:  0.32 m MV E velocity: 57.80 cm/s  Systemic Diam: 2.00 cm MV A velocity: 97.10 cm/s MV E/A ratio:  0.60 Jenkins Rouge MD Electronically signed by Jenkins Rouge MD Signature Date/Time: 09/16/2021/11:15:52 AM    Final     Microbiology: No results found for this or any previous visit (from the past 240 hour(s)).   Labs: Basic Metabolic Panel: Recent Labs  Lab 09/15/21 0851  NA 136  K 4.1  CL 106  CO2 20*  GLUCOSE 228*  BUN 22  CREATININE 1.06  CALCIUM 9.3   Liver Function Tests: Recent Labs  Lab 09/15/21 0851  AST 28  ALT 27  ALKPHOS 36*  BILITOT 0.8  PROT 7.1  ALBUMIN 3.7  No results for input(s): LIPASE, AMYLASE in the last 168 hours. No results for input(s): AMMONIA in the last 168 hours. CBC: Recent Labs  Lab 09/15/21 0851  WBC 6.6  NEUTROABS 4.6  HGB 14.5  HCT 42.3  MCV 93.2  PLT 223   Cardiac Enzymes: No results for input(s): CKTOTAL, CKMB, CKMBINDEX, TROPONINI in the last 168 hours. BNP: BNP (last 3 results) No results for input(s): BNP in the last 8760 hours.  ProBNP (last 3 results) No results for  input(s): PROBNP in the last 8760 hours.  CBG: Recent Labs  Lab 09/15/21 1821 09/15/21 2108 09/16/21 0610 09/16/21 1245  GLUCAP 202* 179* 229* 192*       Signed:  Nita Sells MD   [facility] 09/16/2021, 2:36 PM

## 2021-09-16 NOTE — Progress Notes (Signed)
STROKE TEAM PROGRESS NOTE   SUBJECTIVE (INTERVAL HISTORY) His wife is at the bedside.  Overall his condition is gradually improving. He still has mild numbness and tingling at left cheek and left forearm and left hand, but otherwise neuro intact. Stroke work up completed.    OBJECTIVE Temp:  [98 F (36.7 C)-98.8 F (37.1 C)] 98.5 F (36.9 C) (05/27 1224) Pulse Rate:  [68-82] 82 (05/27 1224) Cardiac Rhythm: Normal sinus rhythm;Heart block;Sinus bradycardia (05/27 0731) Resp:  [14-18] 18 (05/27 1224) BP: (141-162)/(71-95) 154/86 (05/27 1224) SpO2:  [97 %-100 %] 97 % (05/27 1224) Weight:  [78.7 kg] 78.7 kg (05/26 1828)  Recent Labs  Lab 09/15/21 1821 09/15/21 2108 09/16/21 0610 09/16/21 1245  GLUCAP 202* 179* 229* 192*   Recent Labs  Lab 09/15/21 0851  NA 136  K 4.1  CL 106  CO2 20*  GLUCOSE 228*  BUN 22  CREATININE 1.06  CALCIUM 9.3   Recent Labs  Lab 09/15/21 0851  AST 28  ALT 27  ALKPHOS 36*  BILITOT 0.8  PROT 7.1  ALBUMIN 3.7   Recent Labs  Lab 09/15/21 0851  WBC 6.6  NEUTROABS 4.6  HGB 14.5  HCT 42.3  MCV 93.2  PLT 223   No results for input(s): CKTOTAL, CKMB, CKMBINDEX, TROPONINI in the last 168 hours. No results for input(s): LABPROT, INR in the last 72 hours. No results for input(s): COLORURINE, LABSPEC, [place], GLUCOSEU, HGBUR, BILIRUBINUR, KETONESUR, PROTEINUR, UROBILINOGEN, NITRITE, LEUKOCYTESUR in the last 72 hours.  Invalid input(s): APPERANCEUR     Component Value Date/Time   CHOL 239 (H) 09/16/2021 0405   TRIG 317 (H) 09/16/2021 0405   TRIG 175 (H) 04/11/2006 0950   HDL 34 (L) 09/16/2021 0405   CHOLHDL 7.0 09/16/2021 0405   VLDL 63 (H) 09/16/2021 0405   LDLCALC 142 (H) 09/16/2021 0405   Lab Results  Component Value Date   HGBA1C 7.3 (H) 09/16/2021      Component Value Date/Time   LABOPIA NONE DETECTED 09/16/2021 1219   COCAINSCRNUR NONE DETECTED 09/16/2021 1219   LABBENZ NONE DETECTED 09/16/2021 1219   AMPHETMU NONE  DETECTED 09/16/2021 1219   THCU NONE DETECTED 09/16/2021 1219   LABBARB NONE DETECTED 09/16/2021 1219    No results for input(s): ETH in the last 168 hours.  I have personally reviewed the radiological images below and agree with the radiology interpretations.  CT ANGIO HEAD NECK W WO CM  Result Date: 09/15/2021 CLINICAL DATA:  Neuro deficit, acute, stroke suspected EXAM: CT ANGIOGRAPHY HEAD AND NECK TECHNIQUE: Multidetector CT imaging of the head and neck was performed using the standard protocol during bolus administration of intravenous contrast. Multiplanar CT image reconstructions and MIPs were obtained to evaluate the vascular anatomy. Carotid stenosis measurements (when applicable) are obtained utilizing NASCET criteria, using the distal internal carotid diameter as the denominator. RADIATION DOSE REDUCTION: This exam was performed according to the departmental dose-optimization program which includes automated exposure control, adjustment of the mA and/or kV according to patient size and/or use of iterative reconstruction technique. CONTRAST:  38m OMNIPAQUE IOHEXOL 350 MG/ML SOLN COMPARISON:  MRI head from the same day. FINDINGS: CT HEAD FINDINGS Brain: Small right thalamic infarct, better characterized on same day MRI. No evidence of acute hemorrhage, hydrocephalus, extra-axial collection or mass lesion/mass effect. Vascular: Detailed below. Skull: No acute fracture. Sinuses: Visualized sinuses are clear. Orbits: No acute finding. Review of the MIP images confirms the above findings CTA NECK FINDINGS Aortic arch: Great vessel origins are  patent without significant stenosis. Right carotid system: Atherosclerosis at the carotid bifurcation with approximately 45% stenosis of the ICA origin. Left carotid system: Atherosclerosis at the carotid bifurcation with approximately 50% stenosis of the ICA origin. Vertebral arteries: Mildly left dominant. Patent bilaterally without significant (greater than  50%) stenosis. Skeleton: C5-C7 ACDF with solid bony fusion. Other neck: No acute findings. Upper chest: Mild biapical pleuroparenchymal scarring. Otherwise, visualized lung apices are clear. Review of the MIP images confirms the above findings CTA HEAD FINDINGS Anterior circulation: Atherosclerosis of bilateral intracranial ICAs. Severe right and mild left paraclinoid ICA stenosis. Bilateral MCAs and ACAs are patent. Suspect severe bilateral A3 ACA stenosis. Right M1 MCAs small with early bifurcation. No aneurysm identified. Posterior circulation: Bilateral intradural vertebral arteries, basilar artery and posterior cerebral arteries are patent. 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 Venous sinuses: As permitted by contrast timing, patent. Review of the MIP images confirms the above findings IMPRESSION: 1. 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 2. Severe right and mild left paraclinoid ICA stenosis. 3. Severe bilateral A3 ACA stenosis. 4. Approximately 50% left and 45% right ICA origin stenosis in the neck. Electronically Signed   By: Margaretha Sheffield M.D.   On: 09/15/2021 17:47   MR BRAIN WO CONTRAST  Result Date: 09/15/2021 CLINICAL DATA:  Neuro deficit, acute, stroke suspected. Left-sided tingling. EXAM: MRI HEAD WITHOUT CONTRAST TECHNIQUE: Multiplanar, multiecho pulse sequences of the brain and surrounding structures were obtained without intravenous contrast. COMPARISON:  None Available. FINDINGS: Brain: There is a 7 mm acute infarct involving the lateral, inferior aspect of the right thalamus. No intracranial hemorrhage, mass, midline shift, or extra-axial fluid collection is identified. No significant chronic white matter disease is evident. There is mild generalized cerebral atrophy. Vascular: Major intracranial vascular flow voids are preserved. Skull and upper cervical spine: Unremarkable bone marrow signal. Sinuses/Orbits: Unremarkable orbits. Trace fluid in the  right sphenoid sinus. Clear mastoid air cells. Other: None. IMPRESSION: Acute right thalamic infarct. Electronically Signed   By: Logan Bores M.D.   On: 09/15/2021 14:13   MR Cervical Spine Wo Contrast  Addendum Date: 09/15/2021   ADDENDUM REPORT: 09/15/2021 14:34 ADDENDUM: 1.6 cm lesion posterior to the left submandibular gland, indeterminate for a cyst, small venolymphatic malformation, or other mass. Correlate with physical examination and consider contrast-enhanced neck CT for further evaluation. Electronically Signed   By: Logan Bores M.D.   On: 09/15/2021 14:34   Result Date: 09/15/2021 CLINICAL DATA:  Ataxia, nontraumatic, cervical pathology suspected. Left-sided tingling. EXAM: MRI CERVICAL SPINE WITHOUT CONTRAST TECHNIQUE: Multiplanar, multisequence MR imaging of the cervical spine was performed. No intravenous contrast was administered. COMPARISON:  Cervical spine MRI 11/18/2008 FINDINGS: Alignment: Chronic straightening of the normal cervical lordosis. No significant listhesis. Vertebrae: No fracture, suspicious marrow lesion, or significant marrow edema. Interval C5-C7 ACDF with evidence of solid arthrodesis. Cord: Normal signal. Posterior Fossa, vertebral arteries, paraspinal tissues: 1.6 cm homogeneously T2 hyperintense lesion posterior to the left submandibular gland. Preserved vertebral artery flow voids. Disc levels: C2-3: Small central disc protrusion, less prominent than on the prior study and without associated spinal stenosis. Mild uncovertebral spurring and facet arthrosis without significant neural foraminal stenosis. C3-4: Mild disc space narrowing. Disc bulging, uncovertebral spurring, and moderate right and mild left facet arthrosis result in moderate spinal stenosis and moderate to severe right and mild-to-moderate left neural foraminal stenosis, progressed from prior. C4-5: Mild disc space narrowing. Disc bulging, a central/left central disc protrusion, uncovertebral spurring, and  mild facet arthrosis result in moderate spinal stenosis with mild cord flattening and moderate to severe bilateral neural foraminal stenosis, progressed from prior. C5-6: Interval ACDF. Patent spinal canal. Mild-to-moderate bilateral neural foraminal stenosis due to uncovertebral spurring. C6-7: Interval ACDF. Patent spinal canal. Mild-to-moderate bilateral neural foraminal stenosis due to uncovertebral spurring. C7-T1: Minimal disc bulging and uncovertebral spurring without stenosis. IMPRESSION: 1. C5-C7 ACDF without residual spinal stenosis. 2. Progressive disc degeneration at C3-4 and C4-5 with moderate spinal stenosis and moderate to severe neural foraminal stenosis. Electronically Signed: By: Logan Bores M.D. On: 09/15/2021 14:26   MR THORACIC SPINE WO CONTRAST  Result Date: 09/15/2021 CLINICAL DATA:  Ataxia, nontraumatic, thoracic pathology suspected. Left-sided tingling. EXAM: MRI THORACIC SPINE WITHOUT CONTRAST TECHNIQUE: Multiplanar, multisequence MR imaging of the thoracic spine was performed. No intravenous contrast was administered. COMPARISON:  None Available. FINDINGS: The study is motion degraded, including moderate motion on the axial T2 spin echo sequence. Alignment: Straightening of the normal thoracic kyphosis. No listhesis. Vertebrae: No fracture or suspicious marrow lesion. Degenerative endplate changes at M76-72 including minimal degenerative edema. Cord: No cord signal abnormality identified within limitations of motion artifact. Paraspinal and other soft tissues: Unremarkable. Disc levels: T1-2 and T2-3: Negative. T3-4: Disc bulging without significant stenosis. T4-5: Small left paracentral disc protrusion and mild facet arthrosis without significant stenosis. T5-6: Disc bulging and moderate facet arthrosis result in mild bilateral neural foraminal stenosis without significant spinal stenosis. T6-7: Small right paracentral disc protrusion without significant stenosis. T7-8: A right  central disc protrusion result in moderate spinal stenosis with mild cord flattening. Mild facet arthrosis. No significant neural foraminal stenosis. T8-9: Disc bulging and a central disc protrusion result in mild spinal stenosis without significant neural foraminal stenosis. T9-10: Disc bulging and severe facet arthrosis result in moderate right greater than left neural foraminal stenosis without spinal stenosis. T10-11: Right eccentric disc bulging and mild-to-moderate facet arthrosis result in mild spinal stenosis and moderate right neural foraminal stenosis. T11-12: Negative. T12-L1: A broad posterior disc protrusion results in borderline spinal stenosis without neural foraminal stenosis. IMPRESSION: 1. Motion degraded examination. 2. Moderate spinal stenosis at T7-8 due to a disc protrusion. 3. Mild spinal stenosis at T8-9 and T10-11. 4. Moderate neural foraminal stenosis bilaterally at T9-10 and on the right at T10-11. Electronically Signed   By: Logan Bores M.D.   On: 09/15/2021 14:33   DG Chest Port 1 View  Result Date: 09/15/2021 CLINICAL DATA:  A 71 year old male presents for evaluation of bradycardia. EXAM: PORTABLE CHEST 1 VIEW COMPARISON:  May 02, 2011 FINDINGS: EKG leads project over the chest. Cardiomediastinal contours and hilar structures are normal. Lungs are clear. No acute skeletal process on limited assessment. IMPRESSION: No acute cardiopulmonary disease. Electronically Signed   By: Zetta Bills M.D.   On: 09/15/2021 09:57   ECHOCARDIOGRAM COMPLETE  Result Date: 09/16/2021    ECHOCARDIOGRAM REPORT   Patient Name:   NAHEEM MOSCO [facility] Date of Exam: 09/16/2021 Medical Rec #:  [id number]        Height:       68.0 in Accession #:    [id number]       Weight:       173.5 lb Date of Birth:  Mar 22, 1951       BSA:          1.924 m Patient Age:    40 years         BP:           141/72  mmHg Patient Gender: M                HR:           55 bpm. Exam Location:  Inpatient Procedure: 2D Echo,  Cardiac Doppler and Color Doppler Indications:    Stroke  History:        Patient has no prior history of Echocardiogram examinations.  Sonographer:    Merrie Roof RDCS Referring Phys: [id number] [place]  1. Left ventricular ejection fraction, by estimation, is 60 to 65%. The left ventricle has normal function. The left ventricle has no regional wall motion abnormalities. Left ventricular diastolic parameters are consistent with Grade I diastolic dysfunction (impaired relaxation).  2. Right ventricular systolic function is normal. The right ventricular size is normal.  3. The mitral valve is normal in structure. Trivial mitral valve regurgitation. No evidence of mitral stenosis.  4. Calcified non coronary cusp mean gradient across valve 13 mmHg but calculated AVA 2.6 cm2 and DVI 0.79. The aortic valve was not well visualized. There is moderate calcification of the aortic valve. There is moderate thickening of the aortic valve. Aortic valve regurgitation is not visualized. Aortic valve sclerosis/calcification is present, without any evidence of aortic stenosis.  5. The inferior vena cava is normal in size with greater than 50% respiratory variability, suggesting right atrial pressure of 3 mmHg. FINDINGS  Left Ventricle: Left ventricular ejection fraction, by estimation, is 60 to 65%. The left ventricle has normal function. The left ventricle has no regional wall motion abnormalities. The left ventricular internal cavity size was normal in size. There is  no left ventricular hypertrophy. Left ventricular diastolic parameters are consistent with Grade I diastolic dysfunction (impaired relaxation). Right Ventricle: The right ventricular size is normal. No increase in right ventricular wall thickness. Right ventricular systolic function is normal. Left Atrium: Left atrial size was normal in size. Right Atrium: Right atrial size was normal in size. Pericardium: There is no evidence of pericardial effusion.  Mitral Valve: The mitral valve is normal in structure. Trivial mitral valve regurgitation. No evidence of mitral valve stenosis. Tricuspid Valve: The tricuspid valve is normal in structure. Tricuspid valve regurgitation is not demonstrated. No evidence of tricuspid stenosis. Aortic Valve: Calcified non coronary cusp mean gradient across valve 13 mmHg but calculated AVA 2.6 cm2 and DVI 0.79. The aortic valve was not well visualized. There is moderate calcification of the aortic valve. There is moderate thickening of the aortic valve. Aortic valve regurgitation is not visualized. Aortic valve sclerosis/calcification is present, without any evidence of aortic stenosis. Aortic valve mean gradient measures 10.0 mmHg. Aortic valve peak gradient measures 18.5 mmHg. Aortic valve area, by VTI measures 2.50 cm. Pulmonic Valve: The pulmonic valve was normal in structure. Pulmonic valve regurgitation is not visualized. No evidence of pulmonic stenosis. Aorta: The aortic root is normal in size and structure. Venous: The inferior vena cava is normal in size with greater than 50% respiratory variability, suggesting right atrial pressure of 3 mmHg. IAS/Shunts: The interatrial septum was not well visualized.  LEFT VENTRICLE PLAX 2D LVIDd:         5.00 cm   Diastology LVIDs:         3.40 cm   LV e' medial:    9.17 cm/s LV PW:         1.10 cm   LV E/e' medial:  6.3 LV IVS:        0.90 cm   LV e' lateral:  12.70 cm/s LVOT diam:     2.00 cm   LV E/e' lateral: 4.6 LV SV:         99 LV SV Index:   52 LVOT Area:     3.14 cm  RIGHT VENTRICLE RV S prime:     15.90 cm/s TAPSE (M-mode): 1.8 cm LEFT ATRIUM             Index        RIGHT ATRIUM           Index LA diam:        3.10 cm 1.61 cm/m   RA Area:     11.90 cm LA Vol (A2C):   51.4 ml 26.72 ml/m  RA Volume:   30.00 ml  15.59 ml/m LA Vol (A4C):   61.3 ml 31.86 ml/m LA Biplane Vol: 58.7 ml 30.51 ml/m  AORTIC VALVE AV Area (Vmax):    2.32 cm AV Area (Vmean):   2.26 cm AV Area  (VTI):     2.50 cm AV Vmax:           215.00 cm/s AV Vmean:          139.500 cm/s AV VTI:            0.398 m AV Peak Grad:      18.5 mmHg AV Mean Grad:      10.0 mmHg LVOT Vmax:         159.00 cm/s LVOT Vmean:        100.250 cm/s LVOT VTI:          0.316 m LVOT/AV VTI ratio: 0.79  AORTA Ao Root diam: 3.30 cm MITRAL VALVE MV Area (PHT): 2.76 cm    SHUNTS MV Decel Time: 275 msec    Systemic VTI:  0.32 m MV E velocity: 57.80 cm/s  Systemic Diam: 2.00 cm MV A velocity: 97.10 cm/s MV E/A ratio:  0.60 Jenkins Rouge MD Electronically signed by Jenkins Rouge MD Signature Date/Time: 09/16/2021/11:15:52 AM    Final      PHYSICAL EXAM  Temp:  [98 F (36.7 C)-98.8 F (37.1 C)] 98.5 F (36.9 C) (05/27 1224) Pulse Rate:  [68-82] 82 (05/27 1224) Resp:  [14-18] 18 (05/27 1224) BP: (141-162)/(71-95) 154/86 (05/27 1224) SpO2:  [97 %-100 %] 97 % (05/27 1224) Weight:  [78.7 kg] 78.7 kg (05/26 1828)  General - Well nourished, well developed, in no apparent distress.  Ophthalmologic - fundi not visualized due to noncooperation.  Cardiovascular - Regular rhythm and rate.  Mental Status -  Level of arousal and orientation to time, place, and person were intact. Language including expression, naming, repetition, comprehension was assessed and found intact. Fund of Knowledge was assessed and was intact.  Cranial Nerves II - XII - II - Visual field intact OU. III, IV, VI - Extraocular movements intact. V - Facial sensation intact bilaterally, except mild left cheek tingling. VII - Facial movement intact bilaterally. VIII - Hearing & vestibular intact bilaterally. X - Palate elevates symmetrically. XI - Chin turning & shoulder shrug intact bilaterally. XII - Tongue protrusion intact.  Motor Strength - The patient's strength was normal in all extremities and pronator drift was absent.  Bulk was normal and fasciculations were absent.   Motor Tone - Muscle tone was assessed at the neck and appendages and was  normal.  Reflexes - The patient's reflexes were symmetrical in all extremities and he had no pathological reflexes.  Sensory - Light touch, temperature/pinprick were assessed  and were symmetrical except mild left forearm and left hand tingling.    Coordination - The patient had normal movements in the hands and feet with no ataxia or dysmetria.  Tremor was absent.  Gait and Station - deferred.   ASSESSMENT/PLAN [name] is a 71 y.o. male with history of hypertension, hyperlipidemia, diabetes admitted for left facial and arm tingling. No tPA given due to outside window.    Stroke:  right small thalamic infarct likely secondary to small vessel disease source MRI right small thalamic infarct CTA head and neck showed intracranial stenosis including bilateral P2, right siphon, bilateral A3.  Extracranial stenosis including ICA bulb left 50%, right 45%. 2D Echo EF 60 to 60% LDL 142 HgbA1c 7.3 UDS negative SCDs for VTE prophylaxis No antithrombotic prior to admission, now on aspirin 81 mg daily and clopidogrel 75 mg daily DAPT for 3 weeks and then aspirin alone. Patient counseled to be compliant with his antithrombotic medications Ongoing aggressive stroke risk factor management Therapy recommendations: None Disposition: Likely home today  Diabetes HgbA1c 7.3 goal < 7.0 Uncontrolled CBG monitoring SSI DM education and close PCP follow up  Hypertension Stable Long term BP goal normotensive  Hyperlipidemia Home meds: Pravastatin 20 and fenofibrate 135 LDL 142, goal < 70 Now on Crestor 20 Continue statin at discharge  Other Stroke Risk Factors Advanced age Obstructive sleep apnea, on CPAP at home  Other [facility] day # 1  Neurology will sign off. Please call with questions. Pt will follow up with stroke clinic NP at [facility] in about 4 weeks. Thanks for the consult.   Rosalin Hawking, MD PhD Stroke Neurology 09/16/2021 1:51 PM    To contact  Stroke Continuity provider, please refer to [email or web address] After hours, contact General Neurology

## 2021-09-18 ENCOUNTER — Telehealth: Payer: Self-pay | Admitting: Family Medicine

## 2021-09-18 NOTE — Telephone Encounter (Signed)
Please call pt about recent inpatient stay- dx'd with CVA.  Needs OV set up.  Thanks.

## 2021-09-19 ENCOUNTER — Telehealth: Payer: Self-pay

## 2021-09-19 ENCOUNTER — Encounter: Payer: Self-pay | Admitting: *Deleted

## 2021-09-19 ENCOUNTER — Other Ambulatory Visit: Payer: Self-pay | Admitting: *Deleted

## 2021-09-19 DIAGNOSIS — I441 Atrioventricular block, second degree: Secondary | ICD-10-CM

## 2021-09-19 NOTE — Telephone Encounter (Signed)
Patient is scheduled for 09/21/21 at 2:30 pm.

## 2021-09-19 NOTE — Progress Notes (Signed)
Patient ID: [name], male   DOB: 1950-08-14, 71 y.o.   MRN: [id number] Patient enrolled for [employer] to ship a 30 day cardiac event monitor to his home.. Letter with instructions mailed to patient. Dr. Harriet Masson to read and follow up.

## 2021-09-19 NOTE — Telephone Encounter (Signed)
TCM not eligible due to [facility]

## 2021-09-21 ENCOUNTER — Encounter: Payer: Self-pay | Admitting: Family Medicine

## 2021-09-21 ENCOUNTER — Ambulatory Visit: Payer: Federal, State, Local not specified - PPO | Admitting: Family Medicine

## 2021-09-21 VITALS — BP 138/82 | HR 97 | Temp 97.9°F | Wt 180.0 lb

## 2021-09-21 DIAGNOSIS — R221 Localized swelling, mass and lump, neck: Secondary | ICD-10-CM

## 2021-09-21 DIAGNOSIS — I441 Atrioventricular block, second degree: Secondary | ICD-10-CM

## 2021-09-21 DIAGNOSIS — Z8673 Personal history of transient ischemic attack (TIA), and cerebral infarction without residual deficits: Secondary | ICD-10-CM | POA: Diagnosis not present

## 2021-09-21 DIAGNOSIS — E119 Type 2 diabetes mellitus without complications: Secondary | ICD-10-CM | POA: Diagnosis not present

## 2021-09-21 MED ORDER — METFORMIN HCL 500 MG PO TABS: 500.0000 mg | ORAL_TABLET | Freq: Two times a day (BID) | ORAL | Status: DC

## 2021-09-21 NOTE — Progress Notes (Signed)
Inpatient follow-up for CVA.  CVA pathophysiology discussed with patient.  Rationale for current medications discussed with patient, including statin aspirin and Plavix.  No myalgias on crestor, d/w pt.    He is off metoprolol in the meantime and recent heart block d/w pt.  He has cardiology f/u pending. Heart monitor is pending.   I placed a referral to cardiology.  Rationale for stopping beta-blocker discussed with patient.  In the meantime, no motor changes.  Some change in sensation in the L hand and forearm.  Face sensation in nearly back to normal.  L leg is nearly normal.  He has neurology f/u pending.  Discussed 1.6 cm lesion posterior to the left submandibular gland, indeterminate for a cyst, small venolymphatic malformation, or other mass. He doesn't have sx from the lesion.  D/w pt about getting f/u scan done.  Ordered.    Discussed heart healthy/low carbohydrate diet.  Handout given to patient.  Meds, vitals, and allergies reviewed.   ROS: Per HPI unless specifically indicated in ROS section   GEN: nad, alert and oriented HEENT: MMM, PERRL NECK: supple w/o LA CV: rrr. PULM: ctab, no inc wob ABD: soft, +bs EXT: no edema SKIN: no acute rash Altered sensation L forearm and hand, the change in sensation is most pronounced on L face and L leg.   Normal strength x4.   See notes on labs.  31 minutes were devoted to patient care in this encounter (this includes time spent reviewing the patient's file/history, interviewing and examining the patient, counseling/reviewing plan with patient).

## 2021-09-21 NOTE — Patient Instructions (Signed)
Go to the lab on the way out.   If you have mychart we'll likely use that to update you.    I'll check with cardiology in the meantime.   Plan on recheck in about 3 months here, fasting labs ahead of time if possible.  If you have aches on crestor then let me know.  Take care.  Glad to see you.  I'll check on the follow up scan of your neck.

## 2021-09-22 LAB — CBC WITH DIFFERENTIAL/PLATELET
Basophils Absolute: 0 10*3/uL (ref 0.0–0.1)
Basophils Relative: 0.4 % (ref 0.0–3.0)
Eosinophils Absolute: 0.2 10*3/uL (ref 0.0–0.7)
Eosinophils Relative: 2.3 % (ref 0.0–5.0)
HCT: 44.3 % (ref 39.0–52.0)
Hemoglobin: 14.8 g/dL (ref 13.0–17.0)
Lymphocytes Relative: 19.1 % (ref 12.0–46.0)
Lymphs Abs: 1.9 10*3/uL (ref 0.7–4.0)
MCHC: 33.5 g/dL (ref 30.0–36.0)
MCV: 94.9 fl (ref 78.0–100.0)
Monocytes Absolute: 0.8 10*3/uL (ref 0.1–1.0)
Monocytes Relative: 7.8 % (ref 3.0–12.0)
Neutro Abs: 7 10*3/uL (ref 1.4–7.7)
Neutrophils Relative %: 70.4 % (ref 43.0–77.0)
Platelets: 232 10*3/uL (ref 150.0–400.0)
RBC: 4.67 Mil/uL (ref 4.22–5.81)
RDW: 13.9 % (ref 11.5–15.5)
WBC: 10 10*3/uL (ref 4.0–10.5)

## 2021-09-22 LAB — BASIC METABOLIC PANEL
BUN: 28 mg/dL — ABNORMAL HIGH (ref 6–23)
CO2: 20 mEq/L (ref 19–32)
Calcium: 9.7 mg/dL (ref 8.4–10.5)
Chloride: 106 mEq/L (ref 96–112)
Creatinine, Ser: 1.02 mg/dL (ref 0.40–1.50)
GFR: 73.46 mL/min (ref >60.00)
Glucose, Bld: 140 mg/dL — ABNORMAL HIGH (ref 70–99)
Potassium: 4.1 mEq/L (ref 3.5–5.1)
Sodium: 135 mEq/L (ref 135–145)

## 2021-09-24 DIAGNOSIS — R221 Localized swelling, mass and lump, neck: Secondary | ICD-10-CM | POA: Insufficient documentation

## 2021-09-24 NOTE — Assessment & Plan Note (Signed)
Continue statin aspirin and Plavix for now.  Discussed timeline for medication use with aspirin/Plavix.  Refer to cardiology for evaluation and outpatient monitoring given history of heart block.  Off beta-blocker currently.  Fortunately his stroke symptoms have improved.  Minimal residual paresthesias on the left side now.  Recheck labs as ordered in about 3 months.  Routine cautions given to patient.  He agrees with plan.  See above.

## 2021-09-24 NOTE — Assessment & Plan Note (Signed)
Not palpable on exam.  CT neck with contrast ordered.  Discussed with patient.

## 2021-09-28 ENCOUNTER — Ambulatory Visit (INDEPENDENT_AMBULATORY_CARE_PROVIDER_SITE_OTHER): Payer: Federal, State, Local not specified - PPO

## 2021-09-28 DIAGNOSIS — I441 Atrioventricular block, second degree: Secondary | ICD-10-CM

## 2021-10-03 ENCOUNTER — Ambulatory Visit: Payer: Federal, State, Local not specified - PPO | Admitting: Internal Medicine

## 2021-10-03 ENCOUNTER — Encounter: Payer: Self-pay | Admitting: Internal Medicine

## 2021-10-03 VITALS — BP 185/94 | HR 88 | Ht 68.0 in | Wt 180.0 lb

## 2021-10-03 DIAGNOSIS — I441 Atrioventricular block, second degree: Secondary | ICD-10-CM

## 2021-10-03 NOTE — Progress Notes (Signed)
Cardiology Office Note:    Date:  10/03/2021   ID:  [name], DOB [birth year], MRN [id number]  PCP:  Tonia Ghent, MD   [facility] Providers Cardiologist:  Janina Mayo, MD     Referring MD: Tonia Ghent, MD   No chief complaint on file. Post Stroke  History of Present Illness:    [name] is a 71 y.o. 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 He felt tingling in his left hand. He was admitted for an acute right thalamic CVA , started on DAPT for 21 days.   He has no cardiac hx. No chest pressure.No CHF symptoms. He denies dizziness or LH. No syncope  Cardiac studies:  EKG:  09/19/2021: NSR, RBBB, Wenckebach, 09/15/2021-Sinus rhythm , Wenckebach, RBBB 09/15/2021: 1440 NSR, RBBB, LAFB, Wenckebach  TTE 09/16/2021: normal LV/RV function, Grade 1DD. AV mean gradient 13 mmHg/calcified. Vmax 2.1 m/s  Past Medical History:  Diagnosis Date   DDD (degenerative disc disease), cervical    Diabetes mellitus without complication ([place])    History of chicken pox    History of skin cancer    Dr.Drew Ronnald Ramp   Hx of adenomatous and sessile serrated colonic polyps 03/21/2017   Hyperlipidemia    [place] Study LDL goal = <130   Hypertension    Metabolic syndrome    Rosacea    Sleep apnea    does not use CPAP machine; treated with prev nasal surgery    Past Surgical History:  Procedure Laterality Date   CERVICAL FUSION     COLONOSCOPY  04/23/2004   Neg   COLONOSCOPY  2018   Dr.Gessner   LUMBAR LAMINECTOMY/DECOMPRESSION MICRODISCECTOMY  05/09/2011   Procedure: LUMBAR LAMINECTOMY/DECOMPRESSION MICRODISCECTOMY;  Surgeon: Magnus Sinning, MD;  Location: [facility];  Service: Orthopedics;  Laterality: Left;  Decompressive laminectomy L2-3,  L3- 4,  L4-5 left, central    NASAL SINUS SURGERY      Current Medications: Current Meds  Medication Sig   aspirin 81 MG chewable tablet Chew 1  tablet (81 mg total) by mouth daily.   benazepril (LOTENSIN) 20 MG tablet Take 1 tablet (20 mg total) by mouth daily.   Choline Fenofibrate (FENOFIBRIC ACID) 135 MG CPDR TAKE 1 CAPSULE BY MOUTH EVERY DAY   clopidogrel (PLAVIX) 75 MG tablet Take 1 tablet (75 mg total) by mouth daily for 20 days.   folic acid (FOLVITE) 1 MG tablet Take 1 tablet (1 mg total) by mouth daily.   Lancets (ONETOUCH ULTRASOFT) lancets Use as instructed to check sugar daily if needed.  E11.9.   metFORMIN (GLUCOPHAGE) 500 MG tablet Take 1 tablet (500 mg total) by mouth 2 (two) times daily with a meal.   rosuvastatin (CRESTOR) 20 MG tablet Take 1 tablet (20 mg total) by mouth at bedtime.   Soft Lens Products (REFRESH CONTACTS DROPS) SOLN Place 1 drop into both eyes daily.     Allergies:   Beta adrenergic blockers   Social History   Socioeconomic History   Marital status: Married    Spouse name: Not on file   Number of children: Not on file   Years of education: Not on file   Highest education level: Not on file  Occupational History   Occupation: Comptroller  Tobacco Use   Smoking status: Former    Types: Cigarettes    Quit date: 04/24/1971  Years since quitting: 50.4   Smokeless tobacco: Never  Vaping Use   Vaping Use: Never used  Substance and Sexual Activity   Alcohol use: No   Drug use: No   Sexual activity: Not on file  Other Topics Concern   Not on file  Social History Narrative   Retired, from post office   Married, 1973   2 sons (1 is local, 1 in Corporate treasurer reserve with prev deployments to [employer])   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     Working part time at [employer].     Social Determinants of Health   Financial Resource Strain: Not on file  Food Insecurity: Not on file  Transportation Needs: Not on file  Physical Activity: Not on file  Stress: Not on file  Social Connections: Not on file     Family History: The patient's family history includes Alzheimer's disease in his  mother; Heart disease in his father and another family member. There is no history of Colon cancer, Prostate cancer, Stomach cancer, Colon polyps, Esophageal cancer, or Rectal cancer. Father had pacemaker.   ROS:   Please see the history of present illness.     All other systems reviewed and are negative.  EKGs/Labs/Other Studies Reviewed:    The following studies were reviewed today:   EKG:  EKG is  ordered today.  The ekg ordered today demonstrates   NSR, 1st degree AV block, RBBB  Recent Labs: 09/15/2021: ALT 27; TSH 2.429 09/21/2021: BUN 28; Creatinine, Ser 1.02; Hemoglobin 14.8; Platelets 232.0; Potassium 4.1; Sodium 135  Recent Lipid Panel    Component Value Date/Time   CHOL 239 (H) 09/16/2021 0405   TRIG 317 (H) 09/16/2021 0405   TRIG 175 (H) 04/11/2006 0950   HDL 34 (L) 09/16/2021 0405   CHOLHDL 7.0 09/16/2021 0405   VLDL 63 (H) 09/16/2021 0405   LDLCALC 142 (H) 09/16/2021 0405   LDLDIRECT 127.0 07/07/2020 0749     Risk Assessment/Calculations:           Physical Exam:    VS:   Vitals:   10/03/21 1028  BP: (!) 185/94  Pulse: 88  SpO2: 98%     Wt Readings from Last 3 Encounters:  10/03/21 180 lb (81.6 kg)  09/21/21 180 lb (81.6 kg)  09/15/21 173 lb 8 oz (78.7 kg)     GEN:  Well nourished, well developed in no acute distress HEENT: Normal NECK: No JVD; No carotid bruits LYMPHATICS: No lymphadenopathy CARDIAC: RRR, SEM RUSB, rubs, gallops RESPIRATORY:  Clear to auscultation without rales, wheezing or rhonchi  ABDOMEN: Soft, non-tender, non-distended MUSCULOSKELETAL:  No edema; No deformity  SKIN: Warm and dry NEUROLOGIC:  Alert and oriented x 3 PSYCHIATRIC:  Normal affect   ASSESSMENT:    #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 Will assess his 30 day event monitor. He is asymptomatic. He has no signs of ACS. His echo showed normal LV function. If he has high grade AV block will refer to EP for PPM  #Stroke : had  acute R thalamic CVA. He has a 30 day event monitor to assess for afib. He is on DAPT, then asa. Continue statin therapy. LDL 122, started crestor 20 mg daily in May. Goal < '70mg'$ /dL   PLAN:    In order of problems listed above:  Cardiac monitor is on for 30 days Follow up 6 months        Medication Adjustments/Labs  and Tests Ordered: Current medicines are reviewed at length with the patient today.  Concerns regarding medicines are outlined above.  No orders of the defined types were placed in this encounter.  No orders of the defined types were placed in this encounter.   Patient Instructions  Medication Instructions:  No Changes In Medications at this time. *If you need a refill on your cardiac medications before your next appointment, please call your pharmacy*  Lab Work: None Ordered At This Time.  If you have labs (blood work) drawn today and your tests are completely normal, you will receive your results only by: [place] (if you have MyChart) OR A paper copy in the mail If you have any lab test that is abnormal or we need to change your treatment, we will call you to review the results.  Testing/Procedures: None Ordered At This Time.   Follow-Up: At [facility], you and your health needs are our priority.  As part of our continuing mission to provide you with exceptional heart care, we have created designated Provider Care Teams.  These Care Teams include your primary Cardiologist (physician) and Advanced Practice Providers (APPs -  Physician Assistants and Nurse Practitioners) who all work together to provide you with the care you need, when you need it.  Your next appointment:   6 month(s)  The format for your next appointment:   In Person  Provider:   Janina Mayo, MD          Signed, Janina Mayo, MD  10/03/2021 11:07 AM    [place]

## 2021-10-03 NOTE — Patient Instructions (Signed)
Medication Instructions:  ?No Changes In Medications at this time.  ?*If you need a refill on your cardiac medications before your next appointment, please call your pharmacy* ? ?Lab Work: ?None Ordered At This Time.  ?If you have labs (blood work) drawn today and your tests are completely normal, you will receive your results only by: ?MyChart Message (if you have MyChart) OR ?A paper copy in the mail ?If you have any lab test that is abnormal or we need to change your treatment, we will call you to review the results. ? ?Testing/Procedures: ?None Ordered At This Time.  ? ?Follow-Up: ?At [facility], you and your health needs are our priority.  As part of our continuing mission to provide you with exceptional heart care, we have created designated Provider Care Teams.  These Care Teams include your primary Cardiologist (physician) and Advanced Practice Providers (APPs -  Physician Assistants and Nurse Practitioners) who all work together to provide you with the care you need, when you need it. ? ?Your next appointment:   ?6 month(s) ? ?The format for your next appointment:   ?In Person ? ?Provider:   ?Branch, Mary E, MD   ?

## 2021-10-04 ENCOUNTER — Encounter: Payer: Self-pay | Admitting: *Deleted

## 2021-10-19 ENCOUNTER — Ambulatory Visit (INDEPENDENT_AMBULATORY_CARE_PROVIDER_SITE_OTHER)
Admission: RE | Admit: 2021-10-19 | Discharge: 2021-10-19 | Disposition: A | Discharge status: Home / Self Care | Payer: Federal, State, Local not specified - PPO | Source: Ambulatory Visit | Attending: Family Medicine | Admitting: Family Medicine

## 2021-10-19 DIAGNOSIS — R221 Localized swelling, mass and lump, neck: Secondary | ICD-10-CM | POA: Diagnosis not present

## 2021-10-19 MED ORDER — IOHEXOL 300 MG/ML  SOLN
75.0000 mL | Freq: Once | INTRAMUSCULAR | Status: AC | PRN
  Administered 2021-10-19: 75 mL via INTRAVENOUS

## 2021-10-22 ENCOUNTER — Other Ambulatory Visit: Payer: Self-pay | Admitting: Family Medicine

## 2021-10-22 DIAGNOSIS — R221 Localized swelling, mass and lump, neck: Secondary | ICD-10-CM

## 2021-11-02 ENCOUNTER — Ambulatory Visit
Admission: RE | Admit: 2021-11-02 | Discharge: 2021-11-02 | Disposition: A | Discharge status: Home / Self Care | Payer: Federal, State, Local not specified - PPO | Source: Ambulatory Visit | Attending: Family Medicine | Admitting: Family Medicine

## 2021-11-02 DIAGNOSIS — R221 Localized swelling, mass and lump, neck: Secondary | ICD-10-CM | POA: Diagnosis present

## 2021-11-03 ENCOUNTER — Telehealth: Payer: Self-pay | Admitting: Internal Medicine

## 2021-11-03 ENCOUNTER — Other Ambulatory Visit: Payer: Self-pay

## 2021-11-03 DIAGNOSIS — I441 Atrioventricular block, second degree: Secondary | ICD-10-CM

## 2021-11-03 DIAGNOSIS — Z0000 Encounter for general adult medical examination without abnormal findings: Secondary | ICD-10-CM

## 2021-11-03 NOTE — Progress Notes (Signed)
Referral made for EP per Dr. Nelly Laurence request.

## 2021-11-03 NOTE — Telephone Encounter (Signed)
Called to to go over monitor results and Dr. Nelly Laurence recommendations. Pt verbalized understanding, referral to EP made.

## 2021-11-03 NOTE — Telephone Encounter (Signed)
Pt called back about monitor results

## 2021-11-06 NOTE — Progress Notes (Unsigned)
Patient: [name] Date of Birth: 11/30/1950  Reason for Visit: Follow up History from: Patient Primary Neurologist:    ASSESSMENT AND PLAN 71 y.o. year old male    HISTORY OF PRESENT ILLNESS: Today 11/06/21  HISTORY  Copied Dr. Curly [place] 09/16/21 HPI: [name] is a 71 y.o. male with a past medical history significant for hypertension, hyperlipidemia, diabetes, obstructive sleep apnea managed with nasal surgery, overweight (BMI 26.38), former smoking, C5-7 ACDF   He reports he went to bed without any symptoms on 5/25, at about 10 PM, and then woke 6:30 AM on 5/26 with new onset tingling of the left face and arm.  He came to the ED for further evaluation and was found to have asymptomatic heart block in addition to a right thalamic stroke.   He notes that he has not had any medication changes recently, including no adjustment of his pravastatin after his LDL resulted as 121 on 07/31/2021   Regarding his sleep apnea, his wife notes that he still snores but not as badly as before he had the surgery and he does not have any witnessed apneas.  He attributes his excessive daytime sleepiness to blood pressure medications but otherwise no obvious symptoms of OSA   LKW: 10 PM on 5/25 tPA given?: No, out of the window Premorbid modified rankin scale:      0 - No symptoms.  REVIEW OF SYSTEMS: Out of a complete 14 system review of symptoms, the patient complains only of the following symptoms, and all other reviewed systems are negative.  See HPI  ALLERGIES: Allergies  Allergen Reactions   Beta Adrenergic Blockers     Would avoid given history of second-degree block.    HOME MEDICATIONS: Outpatient Medications Prior to Visit  Medication Sig Dispense Refill   aspirin 81 MG chewable tablet Chew 1 tablet (81 mg total) by mouth daily. 30 tablet 12   benazepril (LOTENSIN) 20 MG tablet Take 1 tablet (20 mg total) by mouth daily. 90 tablet 3   Choline Fenofibrate (FENOFIBRIC  ACID) 135 MG CPDR TAKE 1 CAPSULE BY MOUTH EVERY DAY 90 capsule 3   folic acid (FOLVITE) 1 MG tablet Take 1 tablet (1 mg total) by mouth daily. 90 tablet 3   Lancets (ONETOUCH ULTRASOFT) lancets Use as instructed to check sugar daily if needed.  E11.9. 100 each 12   metFORMIN (GLUCOPHAGE) 500 MG tablet Take 1 tablet (500 mg total) by mouth 2 (two) times daily with a meal.     rosuvastatin (CRESTOR) 20 MG tablet Take 1 tablet (20 mg total) by mouth at bedtime. 30 tablet 3   Soft Lens Products (REFRESH CONTACTS DROPS) SOLN Place 1 drop into both eyes daily.     No facility-administered medications prior to visit.    PAST MEDICAL HISTORY: Past Medical History:  Diagnosis Date   DDD (degenerative disc disease), cervical    Diabetes mellitus without complication ([place])    History of chicken pox    History of skin cancer    Dr.Drew Ronnald Ramp   Hx of adenomatous and sessile serrated colonic polyps 03/21/2017   Hyperlipidemia    [place] Study LDL goal = <130   Hypertension    Metabolic syndrome    Rosacea    Sleep apnea    does not use CPAP machine; treated with prev nasal surgery    PAST SURGICAL HISTORY: Past Surgical History:  Procedure Laterality Date   CERVICAL FUSION     COLONOSCOPY  04/23/2004   Neg   COLONOSCOPY  2018   Dr.Gessner   LUMBAR LAMINECTOMY/DECOMPRESSION MICRODISCECTOMY  05/09/2011   Procedure: LUMBAR LAMINECTOMY/DECOMPRESSION MICRODISCECTOMY;  Surgeon: Magnus Sinning, MD;  Location: [facility];  Service: Orthopedics;  Laterality: Left;  Decompressive laminectomy L2-3,  L3- 4,  L4-5 left, central    NASAL SINUS SURGERY      FAMILY HISTORY: Family History  Problem Relation Age of Onset   Alzheimer's disease Mother    Heart disease Father        Pacemaker   Heart disease Other    Colon cancer Neg Hx    Prostate cancer Neg Hx    Stomach cancer Neg Hx    Colon polyps Neg Hx    Esophageal cancer Neg Hx    Rectal cancer Neg Hx     SOCIAL HISTORY: Social  History   Socioeconomic History   Marital status: Married    Spouse name: Not on file   Number of children: Not on file   Years of education: Not on file   Highest education level: Not on file  Occupational History   Occupation: Comptroller  Tobacco Use   Smoking status: Former    Types: Cigarettes    Quit date: 04/24/1971    Years since quitting: 50.5   Smokeless tobacco: Never  Vaping Use   Vaping Use: Never used  Substance and Sexual Activity   Alcohol use: No   Drug use: No   Sexual activity: Not on file  Other Topics Concern   Not on file  Social History Narrative   Retired, from post office   Married, 1973   2 sons (1 is local, 1 in Corporate treasurer reserve with prev deployments to [employer])   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     Working part time at [employer].     Social Determinants of Health   Financial Resource Strain: Not on file  Food Insecurity: Not on file  Transportation Needs: Not on file  Physical Activity: Not on file  Stress: Not on file  Social Connections: Not on file  Intimate Partner Violence: Not on file    PHYSICAL EXAM  There were no vitals filed for this visit. There is no height or weight on file to calculate BMI.  Generalized: Well developed, in no acute distress  Neurological examination  Mentation: Alert oriented to time, place, history taking. Follows all commands speech and language fluent Cranial nerve II-XII: Pupils were equal round reactive to light. Extraocular movements were full, visual field were full on confrontational test. Facial sensation and strength were normal. Uvula tongue midline. Head turning and shoulder shrug  were normal and symmetric. Motor: The motor testing reveals 5 over 5 strength of all 4 extremities. Good symmetric motor tone is noted throughout.  Sensory: Sensory testing is intact to soft touch on all 4 extremities. No evidence of extinction is noted.  Coordination: Cerebellar testing reveals good  finger-nose-finger and heel-to-shin bilaterally.  Gait and station: Gait is normal. Tandem gait is normal. Romberg is negative. No drift is seen.  Reflexes: Deep tendon reflexes are symmetric and normal bilaterally.   DIAGNOSTIC DATA (LABS, IMAGING, TESTING) - I reviewed patient records, labs, notes, testing and imaging myself where available.  Lab Results  Component Value Date   WBC 10.0 09/21/2021   HGB 14.8 09/21/2021   HCT 44.3 09/21/2021   MCV 94.9 09/21/2021   PLT 232.0 09/21/2021      Component Value  Date/Time   NA 135 09/21/2021 1529   K 4.1 09/21/2021 1529   CL 106 09/21/2021 1529   CO2 20 09/21/2021 1529   GLUCOSE 140 (H) 09/21/2021 1529   BUN 28 (H) 09/21/2021 1529   CREATININE 1.02 09/21/2021 1529   CALCIUM 9.7 09/21/2021 1529   PROT 7.1 09/15/2021 0851   ALBUMIN 3.7 09/15/2021 0851   AST 28 09/15/2021 0851   ALT 27 09/15/2021 0851   ALKPHOS 36 (L) 09/15/2021 0851   BILITOT 0.8 09/15/2021 0851   GFRNONAA >60 09/15/2021 0851   GFRAA >90 05/02/2011 1020   Lab Results  Component Value Date   CHOL 239 (H) 09/16/2021   HDL 34 (L) 09/16/2021   LDLCALC 142 (H) 09/16/2021   LDLDIRECT 127.0 07/07/2020   TRIG 317 (H) 09/16/2021   CHOLHDL 7.0 09/16/2021   Lab Results  Component Value Date   HGBA1C 7.3 (H) 09/16/2021   No results found for: "VITAMINB12" Lab Results  Component Value Date   TSH 2.429 09/15/2021    Butler Denmark, AGNP-C, DNP 11/06/2021, 4:15 PM [facility] [address], [place], [place] [address] [phone]

## 2021-11-07 ENCOUNTER — Encounter: Payer: Self-pay | Admitting: Neurology

## 2021-11-07 ENCOUNTER — Ambulatory Visit: Payer: Federal, State, Local not specified - PPO | Admitting: Neurology

## 2021-11-07 VITALS — BP 160/90 | HR 70 | Ht 68.0 in | Wt 182.0 lb

## 2021-11-07 DIAGNOSIS — I10 Essential (primary) hypertension: Secondary | ICD-10-CM

## 2021-11-07 DIAGNOSIS — E782 Mixed hyperlipidemia: Secondary | ICD-10-CM

## 2021-11-07 DIAGNOSIS — I6381 Other cerebral infarction due to occlusion or stenosis of small artery: Secondary | ICD-10-CM

## 2021-11-07 NOTE — Patient Instructions (Addendum)
Continue the 81 mg aspirin  Get BP cuff at home, would like to be < 130/90, if elevated still discuss with PCP LDL < 70, A1C < 7.0 Follow up with cardiology  Continue to see primary care doctor

## 2021-11-07 NOTE — Progress Notes (Signed)
I agree with the above plan 

## 2021-12-15 ENCOUNTER — Other Ambulatory Visit (INDEPENDENT_AMBULATORY_CARE_PROVIDER_SITE_OTHER): Payer: Federal, State, Local not specified - PPO

## 2021-12-15 DIAGNOSIS — E119 Type 2 diabetes mellitus without complications: Secondary | ICD-10-CM | POA: Diagnosis not present

## 2021-12-15 LAB — LIPID PANEL
Cholesterol: 146 mg/dL (ref 0–200)
HDL: 34.3 mg/dL — ABNORMAL LOW (ref >39.00)
LDL Cholesterol: 77 mg/dL (ref 0–99)
NonHDL: 111.92
Total CHOL/HDL Ratio: 4
Triglycerides: 174 mg/dL — ABNORMAL HIGH (ref 0.0–149.0)
VLDL: 34.8 mg/dL (ref 0.0–40.0)

## 2021-12-15 LAB — HEMOGLOBIN A1C: Hgb A1c MFr Bld: 7.7 % — ABNORMAL HIGH (ref 4.6–6.5)

## 2021-12-22 ENCOUNTER — Encounter: Payer: Self-pay | Admitting: Family Medicine

## 2021-12-22 ENCOUNTER — Ambulatory Visit: Payer: Federal, State, Local not specified - PPO | Admitting: Family Medicine

## 2021-12-22 DIAGNOSIS — E119 Type 2 diabetes mellitus without complications: Secondary | ICD-10-CM

## 2021-12-22 MED ORDER — METFORMIN HCL 500 MG PO TABS: 500.0000 mg | ORAL_TABLET | Freq: Two times a day (BID) | ORAL | 3 refills | Status: DC

## 2021-12-22 MED ORDER — ROSUVASTATIN CALCIUM 20 MG PO TABS: 20.0000 mg | ORAL_TABLET | Freq: Every day | ORAL | 3 refills | Status: DC

## 2021-12-22 NOTE — Patient Instructions (Addendum)
Recheck late January 2024 with A1c at the visit.  You don't have to fast.   Increase metformin to 2 tab in the AM 1 in the PM.  If tolerated for about 2 weeks, then try 2 AM and 2 PM.  If GI upset, then cut back.  If sugar isn't better, then let me know.   I'll await cardiology notes.   Take care.  Glad to see you.

## 2021-12-22 NOTE — Progress Notes (Unsigned)
Weight 179---> 172.  Intentional weight loss with diet.  Discussed insulin resistance.    Diabetes:  Using medications without difficulties: yes Hypoglycemic episodes: no Hyperglycemic episodes: no Feet problems: no Blood Sugars averaging: ~130s eye exam within last year: pending for November.   TSH recent wnl, d/w pt.   A1c slightly higher, d/w pt.    He has cardiology f/u pending.  He has normal motor function after CVA with L arm tingling that is getting better.   Lipids d/w pt, not all the way to goal but clearly better.  No ADE on current meds, no aches on statin.    Meds, vitals, and allergies reviewed.   ROS: Per HPI unless specifically indicated in ROS section   GEN: nad, alert and oriented HEENT:ncat NECK: supple w/o LA CV: rrr. PULM: ctab, no inc wob ABD: soft, +bs EXT: no edema SKIN: no acute rash  Diabetic foot exam: Normal inspection No skin breakdown No calluses  Normal DP pulses Normal sensation to light touch and monofilament Nails normal

## 2021-12-25 NOTE — Assessment & Plan Note (Signed)
TSH recent wnl, d/w pt.   A1c slightly higher, d/w pt.   Discussed medication options. Recheck late January 2024 with A1c at the visit.  Increase metformin to 2 tab in the AM 1 in the PM.  If tolerated for about 2 weeks, then try 2 AM and 2 PM.  If GI upset, then cut back.  If sugar isn't better, then he can let me know.

## 2022-01-12 ENCOUNTER — Encounter: Payer: Self-pay | Admitting: Cardiology

## 2022-01-12 ENCOUNTER — Ambulatory Visit: Payer: Federal, State, Local not specified - PPO | Attending: Cardiology | Admitting: Cardiology

## 2022-01-12 VITALS — BP 152/72 | HR 77 | Ht 68.0 in | Wt 181.0 lb

## 2022-01-12 DIAGNOSIS — I444 Left anterior fascicular block: Secondary | ICD-10-CM | POA: Diagnosis not present

## 2022-01-12 DIAGNOSIS — I441 Atrioventricular block, second degree: Secondary | ICD-10-CM

## 2022-01-12 DIAGNOSIS — I4510 Unspecified right bundle-branch block: Secondary | ICD-10-CM

## 2022-01-12 NOTE — Progress Notes (Deleted)
Electrophysiology Office Note:    Date:  01/12/2022   ID:  [name], DOB Oct 28, 1950, MRN [id number]  PCP:  Tonia Ghent, MD  [facility] Cardiologist:  Janina Mayo, MD  [facility] Electrophysiologist:  Vickie Epley, MD   Referring MD: Janina Mayo, MD   Chief Complaint: Bradycardia  History of Present Illness:    [name] is a 71 y.o. male who presents for an evaluation of bradycardia at the request of Phineas Inches, MD. Their medical history includes hypertension, hyperlipidemia, right thalamic stroke in 2023.  The patient last saw Dr. Harl Bowie October 03, 2021.  The patient's AV conduction is abnormal with AV Wenckebach, right bundle branch block and a first-degree AV delay.  After the last appointment, a cardiac monitor was ordered.     Past Medical History:  Diagnosis Date   DDD (degenerative disc disease), cervical    Diabetes mellitus without complication ([place])    History of chicken pox    History of skin cancer    Dr.Drew Ronnald Ramp   Hx of adenomatous and sessile serrated colonic polyps 03/21/2017   Hyperlipidemia    Framingham Study LDL goal = <130   Hypertension    Metabolic syndrome    Rosacea    Sleep apnea    does not use CPAP machine; treated with prev nasal surgery    Past Surgical History:  Procedure Laterality Date   CERVICAL FUSION     COLONOSCOPY  04/23/2004   Neg   COLONOSCOPY  2018   Dr.Gessner   LUMBAR LAMINECTOMY/DECOMPRESSION MICRODISCECTOMY  05/09/2011   Procedure: LUMBAR LAMINECTOMY/DECOMPRESSION MICRODISCECTOMY;  Surgeon: Magnus Sinning, MD;  Location: [facility];  Service: Orthopedics;  Laterality: Left;  Decompressive laminectomy L2-3,  L3- 4,  L4-5 left, central    NASAL SINUS SURGERY      Current Medications: No outpatient medications have been marked as taking for the 01/12/22 encounter (Appointment) with Vickie Epley, MD.     Allergies:   Beta adrenergic blockers   Social History   Socioeconomic  History   Marital status: Married    Spouse name: Not on file   Number of children: Not on file   Years of education: Not on file   Highest education level: Not on file  Occupational History   Occupation: Comptroller  Tobacco Use   Smoking status: Former    Types: Cigarettes    Quit date: 04/24/1971    Years since quitting: 50.7    Passive exposure: Never   Smokeless tobacco: Never  Vaping Use   Vaping Use: Never used  Substance and Sexual Activity   Alcohol use: No   Drug use: No   Sexual activity: Not on file  Other Topics Concern   Not on file  Social History Narrative   Retired, from post office   Married, 1973   2 sons (1 is local, 1 in Corporate treasurer reserve with prev deployments to [employer])   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     Working part time at [employer].     Social Determinants of Health   Financial Resource Strain: Not on file  Food Insecurity: Not on file  Transportation Needs: Not on file  Physical Activity: Not on file  Stress: Not on file  Social Connections: Not on file     Family History: The patient's family history includes Alzheimer's disease in his mother; Heart disease in his father and another family member.  There is no history of Colon cancer, Prostate cancer, Stomach cancer, Colon polyps, Esophageal cancer, or Rectal cancer.  ROS:   Please see the history of present illness.    All other systems reviewed and are negative.  EKGs/Labs/Other Studies Reviewed:    The following studies were reviewed today:  November 03, 2021 heart monitor personally reviewed shows episodes of 2-1 AV block, Wenckebach, Mobitz type I.  There is some periods that appear to be higher degree AV block  EKG from June 13 shows sinus rhythm, right bundle branch block, left anterior fascicular block and first-degree AV delay   Recent Labs: 09/15/2021: ALT 27; TSH 2.429 09/21/2021: BUN 28; Creatinine, Ser 1.02; Hemoglobin 14.8; Platelets 232.0; Potassium 4.1; Sodium  135  Recent Lipid Panel    Component Value Date/Time   CHOL 146 12/15/2021 0756   TRIG 174.0 (H) 12/15/2021 0756   TRIG 175 (H) 04/11/2006 0950   HDL 34.30 (L) 12/15/2021 0756   CHOLHDL 4 12/15/2021 0756   VLDL 34.8 12/15/2021 0756   LDLCALC 77 12/15/2021 0756   LDLDIRECT 127.0 07/07/2020 0749    Physical Exam:    VS:  There were no vitals taken for this visit.    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 RBBB   3. LAFB (left anterior fascicular block)    PLAN:    In order of problems listed above:  #AV conduction disease        Total time spent with patient today *** minutes. This includes reviewing records, evaluating the patient and coordinating care.  Medication Adjustments/Labs and Tests Ordered: Current medicines are reviewed at length with the patient today.  Concerns regarding medicines are outlined above.  No orders of the defined types were placed in this encounter.  No orders of the defined types were placed in this encounter.    Signed, Hilton Cork. Quentin Ore, MD, [facility], [facility] 01/12/2022 7:43 AM    Electrophysiology [facility]

## 2022-01-12 NOTE — Patient Instructions (Addendum)
Medication Instructions:  none *If you need a refill on your cardiac medications before your next appointment, please call your pharmacy*   Lab Work: none If you have labs (blood work) drawn today and your tests are completely normal, you will receive your results only by: [place] (if you have MyChart) OR A paper copy in the mail If you have any lab test that is abnormal or we need to change your treatment, we will call you to review the results.   Testing/Procedures: Your physician has requested that you have an exercise tolerance test. For further information please visit [email or web address]. Please also follow instruction sheet, as given.   Follow-Up: At [facility], you and your health needs are our priority.  As part of our continuing mission to provide you with exceptional heart care, we have created designated Provider Care Teams.  These Care Teams include your primary Cardiologist (physician) and Advanced Practice Providers (APPs -  Physician Assistants and Nurse Practitioners) who all work together to provide you with the care you need, when you need it.  We recommend signing up for the patient portal called "MyChart".  Sign up information is provided on this After Visit Summary.  MyChart is used to connect with patients for Virtual Visits (Telemedicine).  Patients are able to view lab/test results, encounter notes, upcoming appointments, etc.  Non-urgent messages can be sent to your provider as well.   To learn more about what you can do with MyChart, go to [email or web address].    Your next appointment:   6 month(s)  The format for your next appointment:   In Person  Provider:   You will see one of the following Advanced Practice Providers on your designated Care Team:   Tommye Standard, [place] "Jonni Sanger" Chalmers Cater, [place]      Other Instructions - you may eat a light breakfast/ lunch prior to your procedure - no caffeine for 24 hours prior to your test  (coffee, tea, soft drinks, or chocolate)  - no smoking/ vaping for 4 hours prior to your test - you may take your regular medications the day of your test  - bring any inhalers with you to your test - wear comfortable clothing & tennis/ non-skid shoes to walk on the treadmill    Important Information About Sugar

## 2022-01-12 NOTE — Progress Notes (Signed)
Electrophysiology Office Note:    Date:  01/12/2022   ID:  [name], DOB 10/17/1950, MRN [id number]  PCP:  Tonia Ghent, MD  [facility] Cardiologist:  Janina Mayo, MD  [facility] Electrophysiologist:  Vickie Epley, MD   Referring MD: Janina Mayo, MD   Chief Complaint: Bradycardia  History of Present Illness:    [name] is a 71 y.o. male who presents for an evaluation of bradycardia at the request of Phineas Inches, MD. Their medical history includes hypertension, hyperlipidemia, right thalamic stroke in 2023.  The patient last saw Dr. Harl Bowie October 03, 2021.  The patient's AV conduction is abnormal with AV Wenckebach, right bundle branch block and a first-degree AV delay.  After the last appointment, a cardiac monitor was ordered.  Today, he is accompanied by a family member. He states he is feeling pretty good at this time.  While wearing his monitor he denies feeling any dizziness. No prior dizzy spells.  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 He confirms being able to use a treadmill without significant issues.  He denies any palpitations, chest pain, shortness of breath, or peripheral edema. No headaches, syncope, orthopnea, or PND.     Past Medical History:  Diagnosis Date   DDD (degenerative disc disease), cervical    Diabetes mellitus without complication ([place])    History of chicken pox    History of skin cancer    Dr.Drew Ronnald Ramp   Hx of adenomatous and sessile serrated colonic polyps 03/21/2017   Hyperlipidemia    Framingham Study LDL goal = <130   Hypertension    Metabolic syndrome    Rosacea    Sleep apnea    does not use CPAP machine; treated with prev nasal surgery    Past Surgical History:  Procedure Laterality Date   CERVICAL FUSION     COLONOSCOPY  04/23/2004   Neg   COLONOSCOPY  2018   Dr.Gessner   LUMBAR LAMINECTOMY/DECOMPRESSION MICRODISCECTOMY  05/09/2011    Procedure: LUMBAR LAMINECTOMY/DECOMPRESSION MICRODISCECTOMY;  Surgeon: Magnus Sinning, MD;  Location: [facility];  Service: Orthopedics;  Laterality: Left;  Decompressive laminectomy L2-3,  L3- 4,  L4-5 left, central    NASAL SINUS SURGERY      Current Medications: Current Meds  Medication Sig   aspirin 81 MG chewable tablet Chew 1 tablet (81 mg total) by mouth daily.   benazepril (LOTENSIN) 20 MG tablet Take 1 tablet (20 mg total) by mouth daily.   Choline Fenofibrate (FENOFIBRIC ACID) 135 MG CPDR TAKE 1 CAPSULE BY MOUTH EVERY DAY   fluticasone (CUTIVATE) 0.005 % ointment Apply topically daily as needed.   folic acid (FOLVITE) 1 MG tablet Take 1 tablet (1 mg total) by mouth daily.   Lancets (ONETOUCH ULTRASOFT) lancets Use as instructed to check sugar daily if needed.  E11.9.   metFORMIN (GLUCOPHAGE) 500 MG tablet Take 1-2 tablets (500-1,000 mg total) by mouth 2 (two) times daily with a meal.   pravastatin (PRAVACHOL) 20 MG tablet Take 20 mg by mouth daily.   Soft Lens Products (REFRESH CONTACTS DROPS) SOLN Place 1 drop into both eyes daily.     Allergies:   Beta adrenergic blockers and Rosuvastatin   Social History   Socioeconomic History   Marital status: Married    Spouse name: Not on file   Number of children: Not on file   Years of education: Not on file  Highest education level: Not on file  Occupational History   Occupation: Comptroller  Tobacco Use   Smoking status: Former    Types: Cigarettes    Quit date: 04/24/1971    Years since quitting: 50.7    Passive exposure: Never   Smokeless tobacco: Never  Vaping Use   Vaping Use: Never used  Substance and Sexual Activity   Alcohol use: No   Drug use: No   Sexual activity: Not on file  Other Topics Concern   Not on file  Social History Narrative   Retired, from post office   Married, 1973   2 sons (1 is local, 1 in Corporate treasurer reserve with prev deployments to [employer])   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      Working part time at [employer].     Social Determinants of Health   Financial Resource Strain: Not on file  Food Insecurity: Not on file  Transportation Needs: Not on file  Physical Activity: Not on file  Stress: Not on file  Social Connections: Not on file     Family History: The patient's family history includes Alzheimer's disease in his mother; Heart disease in his father and another family member. There is no history of Colon cancer, Prostate cancer, Stomach cancer, Colon polyps, Esophageal cancer, or Rectal cancer.  ROS:   Please see the history of present illness.    All other systems reviewed and are negative.  EKGs/Labs/Other Studies Reviewed:    The following studies were reviewed today:  November 03, 2021 heart monitor personally reviewed shows episodes of 2-1 AV block, Wenckebach, Mobitz type I.     EKG from June 13 shows sinus rhythm, right bundle branch block, left anterior fascicular block and first-degree AV delay  EKG:   EKG is personally reviewed.  01/12/2022:  sinus rhythm, RBBB, wenckebach   Recent Labs: 09/15/2021: ALT 27; TSH 2.429 09/21/2021: BUN 28; Creatinine, Ser 1.02; Hemoglobin 14.8; Platelets 232.0; Potassium 4.1; Sodium 135   Recent Lipid Panel    Component Value Date/Time   CHOL 146 12/15/2021 0756   TRIG 174.0 (H) 12/15/2021 0756   TRIG 175 (H) 04/11/2006 0950   HDL 34.30 (L) 12/15/2021 0756   CHOLHDL 4 12/15/2021 0756   VLDL 34.8 12/15/2021 0756   LDLCALC 77 12/15/2021 0756   LDLDIRECT 127.0 07/07/2020 0749    Physical Exam:    VS:  BP (!) 152/72   Pulse 77   Ht '5\' 8"'$  (1.727 m)   Wt 181 lb (82.1 kg)   SpO2 96%   BMI 27.52 kg/m     Wt Readings from Last 3 Encounters:  01/12/22 181 lb (82.1 kg)  12/22/21 172 lb (78 kg)  11/07/21 182 lb (82.6 kg)     GEN: Well nourished, well developed in no acute distress HEENT: Normal NECK: No JVD; No carotid bruits LYMPHATICS: No lymphadenopathy CARDIAC: irregular rhythm, no murmurs,  rubs, gallops RESPIRATORY:  Clear to auscultation without rales, wheezing or rhonchi  ABDOMEN: Soft, non-tender, non-distended MUSCULOSKELETAL:  No edema; No deformity  SKIN: Warm and dry NEUROLOGIC:  Alert and oriented x 3 PSYCHIATRIC:  Normal affect       ASSESSMENT:    1. 2nd degree AV block   2. RBBB   3. LAFB (left anterior fascicular block)    PLAN:    In order of problems listed above:  #AV conduction disease RBBB and Wenckebach. Asymptomatic. No syncope or presyncope.  Currently I do not  think there is an indication for permanent pacing. I have discussed "red flag" symptoms that should prompt presentation to the ER or clinic including presyncope.   I will order a POET today to assess his AV conduction with exertion and his chronotropic competence.   Follow-up in 6 months.  Total time spent with patient today 45 minutes. This includes reviewing records, evaluating the patient and coordinating care.  Medication Adjustments/Labs and Tests Ordered: Current medicines are reviewed at length with the patient today.  Concerns regarding medicines are outlined above.   No orders of the defined types were placed in this encounter.  No orders of the defined types were placed in this encounter.   I,Mathew Stumpf,acting as a Education administrator for Vickie Epley, MD.,have documented all relevant documentation on the behalf of Vickie Epley, MD,as directed by  Vickie Epley, MD while in the presence of Vickie Epley, MD.  I, Vickie Epley, MD, have reviewed all documentation for this visit. The documentation on 01/12/22 for the exam, diagnosis, procedures, and orders are all accurate and complete.   Signed, Hilton Cork. Quentin Ore, MD, [facility], [facility] 01/12/2022 10:32 AM    Electrophysiology [facility]

## 2022-01-25 ENCOUNTER — Ambulatory Visit: Payer: Federal, State, Local not specified - PPO | Attending: Cardiology

## 2022-01-25 DIAGNOSIS — I441 Atrioventricular block, second degree: Secondary | ICD-10-CM

## 2022-01-25 DIAGNOSIS — I4510 Unspecified right bundle-branch block: Secondary | ICD-10-CM

## 2022-01-25 DIAGNOSIS — I444 Left anterior fascicular block: Secondary | ICD-10-CM | POA: Diagnosis not present

## 2022-01-25 LAB — EXERCISE TOLERANCE TEST
Base ST Depression (mm): 0 mm
Estimated workload: 5.5
Exercise duration (min): 3 min
Exercise duration (sec): 49 s
MPHR: 148 {beats}/min
Peak HR: 96 {beats}/min
Percent HR: 64 %
Rest HR: 57 {beats}/min
ST Depression (mm): 0 mm

## 2022-01-30 ENCOUNTER — Ambulatory Visit: Payer: Federal, State, Local not specified - PPO | Admitting: Family Medicine

## 2022-02-06 ENCOUNTER — Encounter: Payer: Self-pay | Admitting: Family Medicine

## 2022-03-06 ENCOUNTER — Ambulatory Visit: Payer: Federal, State, Local not specified - PPO | Attending: Cardiology | Admitting: Cardiology

## 2022-03-06 ENCOUNTER — Encounter: Payer: Self-pay | Admitting: Cardiology

## 2022-03-06 ENCOUNTER — Encounter: Payer: Self-pay | Admitting: *Deleted

## 2022-03-06 VITALS — BP 152/80 | HR 49 | Ht 68.0 in | Wt 179.0 lb

## 2022-03-06 DIAGNOSIS — I4510 Unspecified right bundle-branch block: Secondary | ICD-10-CM | POA: Diagnosis not present

## 2022-03-06 DIAGNOSIS — I495 Sick sinus syndrome: Secondary | ICD-10-CM

## 2022-03-06 DIAGNOSIS — I441 Atrioventricular block, second degree: Secondary | ICD-10-CM

## 2022-03-06 DIAGNOSIS — I444 Left anterior fascicular block: Secondary | ICD-10-CM | POA: Diagnosis not present

## 2022-03-06 DIAGNOSIS — Z01818 Encounter for other preprocedural examination: Secondary | ICD-10-CM

## 2022-03-06 NOTE — H&P (View-Only) (Signed)
Electrophysiology Office Note:    Date:  03/06/2022   ID:  [name], DOB [birth year], MRN [id number]  PCP:  Tonia Ghent, MD  [facility] Cardiologist:  Janina Mayo, MD  [facility] Electrophysiologist:  Vickie Epley, MD   Referring MD: Tonia Ghent, MD   Chief Complaint: Bradycardia  History of Present Illness:    [name] is a 71 y.o. male who presents for a follow-up visit.  The patient saw Dr. Harl Bowie October 03, 2021.  His AV conduction is abnormal with AV Wenckebach, right bundle branch block and a first-degree AV delay.    At his last appointment 01/12/2022 we did not feel there was indication for permanent pacing. POET was ordered to assess his AV conduction with exertion and his chronotropic competence.    ETT 01/25/2022 was notable for poor exercise capacity and poor chronotropic competence. He achieved 5.5 METS. Suspected that he would benefit from a pacemaker.  Today, he is accompanied by his wife. He reports that there were no chest pains or dizzy spells during his recent stress test.  His wife notes that he has daytime somnolence. He may fall asleep quickly after sitting down. She has also been aware of pauses in his breathing while he sleeps at night.  He continues to work several hours for a couple days a week. We reviewed physical restrictions regarding PPM from a cardiovascular perspective.  He denies any palpitations, shortness of breath, or peripheral edema. No headaches, syncope, orthopnea, or PND.     Past Medical History:  Diagnosis Date   DDD (degenerative disc disease), cervical    Diabetes mellitus without complication ([place])    History of chicken pox    History of skin cancer    Dr.Drew Ronnald Ramp   Hx of adenomatous and sessile serrated colonic polyps 03/21/2017   Hyperlipidemia    Framingham Study LDL goal = <130   Hypertension    Metabolic syndrome    Rosacea    Sleep apnea    does not use CPAP machine; treated  with prev nasal surgery    Past Surgical History:  Procedure Laterality Date   CERVICAL FUSION     COLONOSCOPY  04/23/2004   Neg   COLONOSCOPY  2018   Dr.Gessner   LUMBAR LAMINECTOMY/DECOMPRESSION MICRODISCECTOMY  05/09/2011   Procedure: LUMBAR LAMINECTOMY/DECOMPRESSION MICRODISCECTOMY;  Surgeon: Magnus Sinning, MD;  Location: [facility];  Service: Orthopedics;  Laterality: Left;  Decompressive laminectomy L2-3,  L3- 4,  L4-5 left, central    NASAL SINUS SURGERY      Current Medications: Current Meds  Medication Sig   aspirin 81 MG chewable tablet Chew 1 tablet (81 mg total) by mouth daily.   benazepril (LOTENSIN) 20 MG tablet Take 1 tablet (20 mg total) by mouth daily.   Choline Fenofibrate (FENOFIBRIC ACID) 135 MG CPDR TAKE 1 CAPSULE BY MOUTH EVERY DAY   fluticasone (CUTIVATE) 0.005 % ointment Apply topically daily as needed.   folic acid (FOLVITE) 1 MG tablet Take 1 tablet (1 mg total) by mouth daily.   Lancets (ONETOUCH ULTRASOFT) lancets Use as instructed to check sugar daily if needed.  E11.9.   metFORMIN (GLUCOPHAGE) 500 MG tablet Take 1-2 tablets (500-1,000 mg total) by mouth 2 (two) times daily with a meal.   pravastatin (PRAVACHOL) 20 MG tablet Take 20 mg by mouth daily.   Soft Lens Products (REFRESH CONTACTS DROPS) SOLN Place 1 drop into both eyes daily.     Allergies:  Beta adrenergic blockers and Rosuvastatin   Social History   Socioeconomic History   Marital status: Married    Spouse name: Not on file   Number of children: Not on file   Years of education: Not on file   Highest education level: Not on file  Occupational History   Occupation: Comptroller  Tobacco Use   Smoking status: Former    Types: Cigarettes    Quit date: 04/24/1971    Years since quitting: 50.9    Passive exposure: Never   Smokeless tobacco: Never  Vaping Use   Vaping Use: Never used  Substance and Sexual Activity   Alcohol use: No   Drug use: No   Sexual activity: Not on  file  Other Topics Concern   Not on file  Social History Narrative   Retired, from post office   Married, 1973   2 sons (1 is local, 1 in Corporate treasurer reserve with prev deployments to [employer])   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     Working part time at [employer].     Social Determinants of Health   Financial Resource Strain: Not on file  Food Insecurity: Not on file  Transportation Needs: Not on file  Physical Activity: Not on file  Stress: Not on file  Social Connections: Not on file     Family History: The patient's family history includes Alzheimer's disease in his mother; Heart disease in his father and another family member. There is no history of Colon cancer, Prostate cancer, Stomach cancer, Colon polyps, Esophageal cancer, or Rectal cancer.  ROS:   Please see the history of present illness.    (+) Daytime somnolence All other systems reviewed and are negative.  EKGs/Labs/Other Studies Reviewed:    The following studies were reviewed today:  ETT  01/25/2022:   Patient exercised for 3 minutes and 49 seconds on the Bruce protocol achieving 5.5 METS.  Poor exercise effort.   Right bundle branch block noted at rest and stress.  PACs noted as well as second-degree heart block type I.   Blood pressure increased from 240 up to 973 systolic during exercise.   Fatigue and dyspnea noted.   Overall intermediate risk test secondary to poor exercise capacity.  No electrocardiographic evidence of ischemia.  Heart rate conduction at peak exercise appeared to be 2-1 AV nodal conduction, heart rate 60 bpm.  Poor chronotropic competence.   November 03, 2021 heart monitor personally reviewed shows episodes of 2-1 AV block, Wenckebach, Mobitz type I.     EKG from June 13 shows sinus rhythm, right bundle branch block, left anterior fascicular block and first-degree AV delay  EKG:   EKG is personally reviewed.  01/12/2022:  sinus rhythm, RBBB, wenckebach   Recent Labs: 09/15/2021: ALT 27;  TSH 2.429 09/21/2021: BUN 28; Creatinine, Ser 1.02; Hemoglobin 14.8; Platelets 232.0; Potassium 4.1; Sodium 135   Recent Lipid Panel    Component Value Date/Time   CHOL 146 12/15/2021 0756   TRIG 174.0 (H) 12/15/2021 0756   TRIG 175 (H) 04/11/2006 0950   HDL 34.30 (L) 12/15/2021 0756   CHOLHDL 4 12/15/2021 0756   VLDL 34.8 12/15/2021 0756   LDLCALC 77 12/15/2021 0756   LDLDIRECT 127.0 07/07/2020 0749    Physical Exam:    VS:  BP (!) 152/80   Pulse (!) 49   Ht '5\' 8"'$  (1.727 m)   Wt 179 lb (81.2 kg)   SpO2 98%   BMI 27.22  kg/m     Wt Readings from Last 3 Encounters:  03/06/22 179 lb (81.2 kg)  01/12/22 181 lb (82.1 kg)  12/22/21 172 lb (78 kg)     GEN: Well nourished, well developed in no acute distress HEENT: Normal NECK: No JVD; No carotid bruits LYMPHATICS: No lymphadenopathy CARDIAC: irregular rhythm, no murmurs, rubs, gallops RESPIRATORY:  Clear to auscultation without rales, wheezing or rhonchi  ABDOMEN: Soft, non-tender, non-distended MUSCULOSKELETAL:  No edema; No deformity  SKIN: Warm and dry NEUROLOGIC:  Alert and oriented x 3 PSYCHIATRIC:  Normal affect       ASSESSMENT:    1. Mobitz type 2 second degree AV block   2. RBBB   3. LAFB (left anterior fascicular block)   4. Sinus node dysfunction (HCC)   5. Pre-op evaluation    PLAN:    In order of problems listed above:  #AV Conduction Disease #Symptomatic bradycardia RBBB and Wenckebach. Poor chronotropic response during recent POET. Long discussion today in clinic re: options including pacing. It seems like he is fairly symptomatic with exertion. I have recommended a dual chamber permanent pacemaker.  Risks, benefits, alternatives to PPM implantation were discussed in detail with the patient today. The patient understands that the risks include but are not limited to bleeding, infection, pneumothorax, perforation, tamponade, vascular damage, renal failure, MI, stroke, death, and lead dislodgement  and wishes to proceed.  We will therefore schedule device implantation at the next available time.  Hold 81 mg ASA for 5 days prior to Abbott dual chamber PPM implant.    Medication Adjustments/Labs and Tests Ordered: Current medicines are reviewed at length with the patient today.  Concerns regarding medicines are outlined above.   Orders Placed This Encounter  Procedures   CBC w/Diff   Basic Metabolic Panel (BMET)   No orders of the defined types were placed in this encounter.  I,Mathew Stumpf,acting as a Education administrator for Vickie Epley, MD.,have documented all relevant documentation on the behalf of Vickie Epley, MD,as directed by  Vickie Epley, MD while in the presence of Vickie Epley, MD.  I, Vickie Epley, MD, have reviewed all documentation for this visit. The documentation on 03/06/22 for the exam, diagnosis, procedures, and orders are all accurate and complete.  Signed, Hilton Cork. Quentin Ore, MD, [facility], [facility] 03/06/2022 7:56 PM    Electrophysiology [facility]

## 2022-03-06 NOTE — Patient Instructions (Signed)
Medication Instructions:  None  *If you need a refill on your cardiac medications before your next appointment, please call your pharmacy*   Lab Work: CBC, BMP If you have labs (blood work) drawn today and your tests are completely normal, you will receive your results only by: [place] (if you have MyChart) OR A paper copy in the mail If you have any lab test that is abnormal or we need to change your treatment, we will call you to review the results.   Testing/Procedures: Your physician has recommended that you have a pacemaker inserted. A pacemaker is a small device that is placed under the skin of your chest or abdomen to help control abnormal heart rhythms. This device uses electrical pulses to prompt the heart to beat at a normal rate. Pacemakers are used to treat heart rhythms that are too slow. Wire (leads) are attached to the pacemaker that goes into the chambers of you heart. This is done in the hospital and usually requires and overnight stay. Please see the instruction sheet given to you today for more information.    Follow-Up: At [facility], you and your health needs are our priority.  As part of our continuing mission to provide you with exceptional heart care, we have created designated Provider Care Teams.  These Care Teams include your primary Cardiologist (physician) and Advanced Practice Providers (APPs -  Physician Assistants and Nurse Practitioners) who all work together to provide you with the care you need, when you need it.  We recommend signing up for the patient portal called "MyChart".  Sign up information is provided on this After Visit Summary.  MyChart is used to connect with patients for Virtual Visits (Telemedicine).  Patients are able to view lab/test results, encounter notes, upcoming appointments, etc.  Non-urgent messages can be sent to your provider as well.   To learn more about what you can do with MyChart, go to [email or web address].     Your next appointment:   See instructions letter.   Important Information About Sugar

## 2022-03-06 NOTE — Progress Notes (Signed)
Electrophysiology Office Note:    Date:  03/06/2022   ID:  ANCIL DEWAN, DOB 1950-07-20, MRN [id number]  PCP:  Tonia Ghent, MD  [facility] Cardiologist:  Janina Mayo, MD  [facility] Electrophysiologist:  Vickie Epley, MD   Referring MD: Tonia Ghent, MD   Chief Complaint: Bradycardia  History of Present Illness:    [name] is a 71 y.o. male who presents for a follow-up visit.  The patient saw Dr. Harl Bowie October 03, 2021.  His AV conduction is abnormal with AV Wenckebach, right bundle branch block and a first-degree AV delay.    At his last appointment 01/12/2022 we did not feel there was indication for permanent pacing. POET was ordered to assess his AV conduction with exertion and his chronotropic competence.    ETT 01/25/2022 was notable for poor exercise capacity and poor chronotropic competence. He achieved 5.5 METS. Suspected that he would benefit from a pacemaker.  Today, he is accompanied by his wife. He reports that there were no chest pains or dizzy spells during his recent stress test.  His wife notes that he has daytime somnolence. He may fall asleep quickly after sitting down. She has also been aware of pauses in his breathing while he sleeps at night.  He continues to work several hours for a couple days a week. We reviewed physical restrictions regarding PPM from a cardiovascular perspective.  He denies any palpitations, shortness of breath, or peripheral edema. No headaches, syncope, orthopnea, or PND.     Past Medical History:  Diagnosis Date   DDD (degenerative disc disease), cervical    Diabetes mellitus without complication ([place])    History of chicken pox    History of skin cancer    Dr.Drew Ronnald Ramp   Hx of adenomatous and sessile serrated colonic polyps 03/21/2017   Hyperlipidemia    Framingham Study LDL goal = <130   Hypertension    Metabolic syndrome    Rosacea    Sleep apnea    does not use CPAP machine; treated  with prev nasal surgery    Past Surgical History:  Procedure Laterality Date   CERVICAL FUSION     COLONOSCOPY  04/23/2004   Neg   COLONOSCOPY  2018   Dr.Gessner   LUMBAR LAMINECTOMY/DECOMPRESSION MICRODISCECTOMY  05/09/2011   Procedure: LUMBAR LAMINECTOMY/DECOMPRESSION MICRODISCECTOMY;  Surgeon: Magnus Sinning, MD;  Location: [facility];  Service: Orthopedics;  Laterality: Left;  Decompressive laminectomy L2-3,  L3- 4,  L4-5 left, central    NASAL SINUS SURGERY      Current Medications: Current Meds  Medication Sig   aspirin 81 MG chewable tablet Chew 1 tablet (81 mg total) by mouth daily.   benazepril (LOTENSIN) 20 MG tablet Take 1 tablet (20 mg total) by mouth daily.   Choline Fenofibrate (FENOFIBRIC ACID) 135 MG CPDR TAKE 1 CAPSULE BY MOUTH EVERY DAY   fluticasone (CUTIVATE) 0.005 % ointment Apply topically daily as needed.   folic acid (FOLVITE) 1 MG tablet Take 1 tablet (1 mg total) by mouth daily.   Lancets (ONETOUCH ULTRASOFT) lancets Use as instructed to check sugar daily if needed.  E11.9.   metFORMIN (GLUCOPHAGE) 500 MG tablet Take 1-2 tablets (500-1,000 mg total) by mouth 2 (two) times daily with a meal.   pravastatin (PRAVACHOL) 20 MG tablet Take 20 mg by mouth daily.   Soft Lens Products (REFRESH CONTACTS DROPS) SOLN Place 1 drop into both eyes daily.     Allergies:  Beta adrenergic blockers and Rosuvastatin   Social History   Socioeconomic History   Marital status: Married    Spouse name: Not on file   Number of children: Not on file   Years of education: Not on file   Highest education level: Not on file  Occupational History   Occupation: Comptroller  Tobacco Use   Smoking status: Former    Types: Cigarettes    Quit date: 04/24/1971    Years since quitting: 50.9    Passive exposure: Never   Smokeless tobacco: Never  Vaping Use   Vaping Use: Never used  Substance and Sexual Activity   Alcohol use: No   Drug use: No   Sexual activity: Not on  file  Other Topics Concern   Not on file  Social History Narrative   Retired, from post office   Married, 1973   2 sons (1 is local, 1 in Corporate treasurer reserve with prev deployments to [employer])   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     Working part time at [employer].     Social Determinants of Health   Financial Resource Strain: Not on file  Food Insecurity: Not on file  Transportation Needs: Not on file  Physical Activity: Not on file  Stress: Not on file  Social Connections: Not on file     Family History: The patient's family history includes Alzheimer's disease in his mother; Heart disease in his father and another family member. There is no history of Colon cancer, Prostate cancer, Stomach cancer, Colon polyps, Esophageal cancer, or Rectal cancer.  ROS:   Please see the history of present illness.    (+) Daytime somnolence All other systems reviewed and are negative.  EKGs/Labs/Other Studies Reviewed:    The following studies were reviewed today:  ETT  01/25/2022:   Patient exercised for 3 minutes and 49 seconds on the Bruce protocol achieving 5.5 METS.  Poor exercise effort.   Right bundle branch block noted at rest and stress.  PACs noted as well as second-degree heart block type I.   Blood pressure increased from 937 up to 169 systolic during exercise.   Fatigue and dyspnea noted.   Overall intermediate risk test secondary to poor exercise capacity.  No electrocardiographic evidence of ischemia.  Heart rate conduction at peak exercise appeared to be 2-1 AV nodal conduction, heart rate 60 bpm.  Poor chronotropic competence.   November 03, 2021 heart monitor personally reviewed shows episodes of 2-1 AV block, Wenckebach, Mobitz type I.     EKG from June 13 shows sinus rhythm, right bundle branch block, left anterior fascicular block and first-degree AV delay  EKG:   EKG is personally reviewed.  01/12/2022:  sinus rhythm, RBBB, wenckebach   Recent Labs: 09/15/2021: ALT 27;  TSH 2.429 09/21/2021: BUN 28; Creatinine, Ser 1.02; Hemoglobin 14.8; Platelets 232.0; Potassium 4.1; Sodium 135   Recent Lipid Panel    Component Value Date/Time   CHOL 146 12/15/2021 0756   TRIG 174.0 (H) 12/15/2021 0756   TRIG 175 (H) 04/11/2006 0950   HDL 34.30 (L) 12/15/2021 0756   CHOLHDL 4 12/15/2021 0756   VLDL 34.8 12/15/2021 0756   LDLCALC 77 12/15/2021 0756   LDLDIRECT 127.0 07/07/2020 0749    Physical Exam:    VS:  BP (!) 152/80   Pulse (!) 49   Ht '5\' 8"'$  (1.727 m)   Wt 179 lb (81.2 kg)   SpO2 98%   BMI 27.22  kg/m     Wt Readings from Last 3 Encounters:  03/06/22 179 lb (81.2 kg)  01/12/22 181 lb (82.1 kg)  12/22/21 172 lb (78 kg)     GEN: Well nourished, well developed in no acute distress HEENT: Normal NECK: No JVD; No carotid bruits LYMPHATICS: No lymphadenopathy CARDIAC: irregular rhythm, no murmurs, rubs, gallops RESPIRATORY:  Clear to auscultation without rales, wheezing or rhonchi  ABDOMEN: Soft, non-tender, non-distended MUSCULOSKELETAL:  No edema; No deformity  SKIN: Warm and dry NEUROLOGIC:  Alert and oriented x 3 PSYCHIATRIC:  Normal affect       ASSESSMENT:    1. Mobitz type 2 second degree AV block   2. RBBB   3. LAFB (left anterior fascicular block)   4. Sinus node dysfunction (HCC)   5. Pre-op evaluation    PLAN:    In order of problems listed above:  #AV Conduction Disease #Symptomatic bradycardia RBBB and Wenckebach. Poor chronotropic response during recent POET. Long discussion today in clinic re: options including pacing. It seems like he is fairly symptomatic with exertion. I have recommended a dual chamber permanent pacemaker.  Risks, benefits, alternatives to PPM implantation were discussed in detail with the patient today. The patient understands that the risks include but are not limited to bleeding, infection, pneumothorax, perforation, tamponade, vascular damage, renal failure, MI, stroke, death, and lead dislodgement  and wishes to proceed.  We will therefore schedule device implantation at the next available time.  Hold 81 mg ASA for 5 days prior to Abbott dual chamber PPM implant.    Medication Adjustments/Labs and Tests Ordered: Current medicines are reviewed at length with the patient today.  Concerns regarding medicines are outlined above.   Orders Placed This Encounter  Procedures   CBC w/Diff   Basic Metabolic Panel (BMET)   No orders of the defined types were placed in this encounter.  I,Mathew Stumpf,acting as a Education administrator for Vickie Epley, MD.,have documented all relevant documentation on the behalf of Vickie Epley, MD,as directed by  Vickie Epley, MD while in the presence of Vickie Epley, MD.  I, Vickie Epley, MD, have reviewed all documentation for this visit. The documentation on 03/06/22 for the exam, diagnosis, procedures, and orders are all accurate and complete.  Signed, Hilton Cork. Quentin Ore, MD, [facility], [facility] 03/06/2022 7:56 PM    Electrophysiology  [facility]

## 2022-03-07 LAB — CBC WITH DIFFERENTIAL/PLATELET
Basophils Absolute: 0.1 10*3/uL (ref 0.0–0.2)
Basos: 1 %
EOS (ABSOLUTE): 0.3 10*3/uL (ref 0.0–0.4)
Eos: 4 %
Hematocrit: 43.9 % (ref 37.5–51.0)
Hemoglobin: 14.4 g/dL (ref 13.0–17.7)
Immature Grans (Abs): 0 10*3/uL (ref 0.0–0.1)
Immature Granulocytes: 0 %
Lymphocytes Absolute: 2.7 10*3/uL (ref 0.7–3.1)
Lymphs: 31 %
MCH: 31.1 pg (ref 26.6–33.0)
MCHC: 32.8 g/dL (ref 31.5–35.7)
MCV: 95 fL (ref 79–97)
Monocytes Absolute: 0.8 10*3/uL (ref 0.1–0.9)
Monocytes: 9 %
Neutrophils Absolute: 4.7 10*3/uL (ref 1.4–7.0)
Neutrophils: 55 %
Platelets: 251 10*3/uL (ref 150–450)
RBC: 4.63 x10E6/uL (ref 4.14–5.80)
RDW: 12.5 % (ref 11.6–15.4)
WBC: 8.6 10*3/uL (ref 3.4–10.8)

## 2022-03-07 LAB — BASIC METABOLIC PANEL
BUN/Creatinine Ratio: 14 (ref 10–24)
BUN: 16 mg/dL (ref 8–27)
CO2: 22 mmol/L (ref 20–29)
Calcium: 9.9 mg/dL (ref 8.6–10.2)
Chloride: 103 mmol/L (ref 96–106)
Creatinine, Ser: 1.12 mg/dL (ref 0.76–1.27)
Glucose: 159 mg/dL — ABNORMAL HIGH (ref 70–99)
Potassium: 4.1 mmol/L (ref 3.5–5.2)
Sodium: 140 mmol/L (ref 134–144)
eGFR: 70 mL/min/{1.73_m2} (ref >59)

## 2022-03-09 LAB — HM DIABETES EYE EXAM

## 2022-03-22 NOTE — Pre-Procedure Instructions (Signed)
Attempted to call patient regarding procedure instructions.  No answer 

## 2022-03-23 ENCOUNTER — Ambulatory Visit (HOSPITAL_COMMUNITY)
Admission: RE | Admit: 2022-03-23 | Discharge: 2022-03-24 | Disposition: A | Discharge status: Home / Self Care | Payer: Federal, State, Local not specified - PPO | Attending: Cardiology | Admitting: Cardiology

## 2022-03-23 ENCOUNTER — Encounter (HOSPITAL_COMMUNITY): Payer: Self-pay | Admitting: Cardiology

## 2022-03-23 ENCOUNTER — Other Ambulatory Visit: Payer: Self-pay

## 2022-03-23 ENCOUNTER — Encounter (HOSPITAL_COMMUNITY)
Admission: RE | Disposition: A | Discharge status: Home / Self Care | Payer: Self-pay | Source: Home / Self Care | Attending: Cardiology

## 2022-03-23 DIAGNOSIS — I10 Essential (primary) hypertension: Secondary | ICD-10-CM | POA: Insufficient documentation

## 2022-03-23 DIAGNOSIS — I444 Left anterior fascicular block: Secondary | ICD-10-CM | POA: Diagnosis not present

## 2022-03-23 DIAGNOSIS — E785 Hyperlipidemia, unspecified: Secondary | ICD-10-CM | POA: Diagnosis not present

## 2022-03-23 DIAGNOSIS — E119 Type 2 diabetes mellitus without complications: Secondary | ICD-10-CM | POA: Diagnosis not present

## 2022-03-23 DIAGNOSIS — I495 Sick sinus syndrome: Secondary | ICD-10-CM | POA: Diagnosis not present

## 2022-03-23 DIAGNOSIS — I441 Atrioventricular block, second degree: Secondary | ICD-10-CM

## 2022-03-23 DIAGNOSIS — G4733 Obstructive sleep apnea (adult) (pediatric): Secondary | ICD-10-CM | POA: Insufficient documentation

## 2022-03-23 DIAGNOSIS — Z87891 Personal history of nicotine dependence: Secondary | ICD-10-CM | POA: Insufficient documentation

## 2022-03-23 HISTORY — PX: PACEMAKER IMPLANT: EP1218

## 2022-03-23 LAB — GLUCOSE, CAPILLARY: Glucose-Capillary: 134 mg/dL — ABNORMAL HIGH (ref 70–99)

## 2022-03-23 SURGERY — PACEMAKER IMPLANT
Anesthesia: LOCAL

## 2022-03-23 MED ORDER — CEFAZOLIN SODIUM-DEXTROSE 2-4 GM/100ML-% IV SOLN
2.0000 g | INTRAVENOUS | Status: AC
  Administered 2022-03-23: 2 g via INTRAVENOUS

## 2022-03-23 MED ORDER — SODIUM CHLORIDE 0.9 % IV SOLN: INTRAVENOUS | Status: DC

## 2022-03-23 MED ORDER — CEFAZOLIN SODIUM-DEXTROSE 2-4 GM/100ML-% IV SOLN
INTRAVENOUS | Status: AC
  Filled 2022-03-23: qty 100

## 2022-03-23 MED ORDER — FENTANYL CITRATE (PF) 100 MCG/2ML IJ SOLN
INTRAMUSCULAR | Status: AC
  Filled 2022-03-23: qty 2

## 2022-03-23 MED ORDER — MIDAZOLAM HCL 5 MG/5ML IJ SOLN
INTRAMUSCULAR | Status: AC
  Filled 2022-03-23: qty 5

## 2022-03-23 MED ORDER — HYDRALAZINE HCL 20 MG/ML IJ SOLN
INTRAMUSCULAR | Status: AC
  Filled 2022-03-23: qty 1

## 2022-03-23 MED ORDER — SODIUM CHLORIDE 0.9 % IV SOLN
80.0000 mg | INTRAVENOUS | Status: AC
  Administered 2022-03-23: 80 mg

## 2022-03-23 MED ORDER — LIDOCAINE HCL (PF) 1 % IJ SOLN
INTRAMUSCULAR | Status: DC | PRN
  Administered 2022-03-23: 55 mL

## 2022-03-23 MED ORDER — ACETAMINOPHEN 325 MG PO TABS
325.0000 mg | ORAL_TABLET | ORAL | Status: DC | PRN
  Administered 2022-03-24: 650 mg via ORAL
  Filled 2022-03-23: qty 2

## 2022-03-23 MED ORDER — SODIUM CHLORIDE 0.9 % IV SOLN
INTRAVENOUS | Status: AC
  Filled 2022-03-23: qty 2

## 2022-03-23 MED ORDER — CHLORHEXIDINE GLUCONATE 4 % EX LIQD: 4.0000 | Freq: Once | CUTANEOUS | Status: DC

## 2022-03-23 MED ORDER — ONDANSETRON HCL 4 MG/2ML IJ SOLN: 4.0000 mg | Freq: Four times a day (QID) | INTRAMUSCULAR | Status: DC | PRN

## 2022-03-23 MED ORDER — BENAZEPRIL HCL 20 MG PO TABS: 20.0000 mg | ORAL_TABLET | Freq: Every day | ORAL | Status: DC

## 2022-03-23 MED ORDER — FENTANYL CITRATE (PF) 100 MCG/2ML IJ SOLN
INTRAMUSCULAR | Status: DC | PRN
  Administered 2022-03-23 (×2): 25 ug via INTRAVENOUS

## 2022-03-23 MED ORDER — METFORMIN HCL 500 MG PO TABS
1000.0000 mg | ORAL_TABLET | Freq: Two times a day (BID) | ORAL | Status: DC
  Administered 2022-03-23: 1000 mg via ORAL
  Filled 2022-03-23 (×2): qty 2

## 2022-03-23 MED ORDER — HYDRALAZINE HCL 25 MG PO TABS: 25.0000 mg | ORAL_TABLET | Freq: Four times a day (QID) | ORAL | Status: DC | PRN

## 2022-03-23 MED ORDER — BENAZEPRIL HCL 20 MG PO TABS
20.0000 mg | ORAL_TABLET | Freq: Every day | ORAL | Status: DC
  Administered 2022-03-24: 20 mg via ORAL
  Filled 2022-03-23: qty 1

## 2022-03-23 MED ORDER — HEPARIN (PORCINE) IN NACL 1000-0.9 UT/500ML-% IV SOLN
INTRAVENOUS | Status: DC | PRN
  Administered 2022-03-23: 500 mL

## 2022-03-23 MED ORDER — ORAL CARE MOUTH RINSE: 15.0000 mL | OROMUCOSAL | Status: DC | PRN

## 2022-03-23 MED ORDER — HYDRALAZINE HCL 20 MG/ML IJ SOLN
10.0000 mg | Freq: Once | INTRAMUSCULAR | Status: AC
  Administered 2022-03-23: 10 mg via INTRAVENOUS

## 2022-03-23 MED ORDER — MIDAZOLAM HCL 5 MG/5ML IJ SOLN
INTRAMUSCULAR | Status: DC | PRN
  Administered 2022-03-23 (×2): 1 mg via INTRAVENOUS

## 2022-03-23 MED ORDER — POVIDONE-IODINE 10 % EX SWAB
2.0000 | Freq: Once | CUTANEOUS | Status: AC
  Administered 2022-03-23: 2 via TOPICAL

## 2022-03-23 MED ORDER — LIDOCAINE HCL (PF) 1 % IJ SOLN
INTRAMUSCULAR | Status: AC
  Filled 2022-03-23: qty 60

## 2022-03-23 SURGICAL SUPPLY — 14 items
CABLE SURGICAL S-101-97-12 (CABLE) ×1 IMPLANT
CATH CPS LOCATOR 3D MED (CATHETERS) IMPLANT
HELIX LOCKING TOOL (MISCELLANEOUS) ×1
LEAD ULTIPACE 52 LPA1231/52 (Lead) IMPLANT
LEAD ULTIPACE 65 LPA1231/65 (Lead) IMPLANT
PACEMAKER ASSURITY DR-RF (Pacemaker) IMPLANT
PAD DEFIB RADIO PHYSIO CONN (PAD) ×1 IMPLANT
SHEATH 7FR PRELUDE SNAP 13 (SHEATH) IMPLANT
SHEATH 9FR PRELUDE SNAP 13 (SHEATH) IMPLANT
SHEATH PROBE COVER 6X72 (BAG) IMPLANT
SLITTER AGILIS HISPRO (INSTRUMENTS) IMPLANT
TOOL HELIX LOCKING (MISCELLANEOUS) IMPLANT
TRAY PACEMAKER INSERTION (PACKS) ×1 IMPLANT
WIRE HI TORQ VERSACORE-J 145CM (WIRE) IMPLANT

## 2022-03-23 NOTE — Progress Notes (Signed)
Patient transferred from cath lab at 1740hrs.  Left arm in sling, dressing CDI.  Given post pacer instructions, patient verbalized understanding.

## 2022-03-23 NOTE — Interval H&P Note (Signed)
History and Physical Interval Note:  03/23/2022 2:19 PM  [name]  has presented today for surgery, with the diagnosis of bradycardia.  The various methods of treatment have been discussed with the patient and family. After consideration of risks, benefits and other options for treatment, the patient has consented to  Procedure(s): PACEMAKER IMPLANT (N/A) as a surgical intervention.  The patient's history has been reviewed, patient examined, no change in status, stable for surgery.  I have reviewed the patient's chart and labs.  Questions were answered to the patient's satisfaction.     Khadija Thier T Shalamar Plourde

## 2022-03-24 ENCOUNTER — Observation Stay (HOSPITAL_COMMUNITY): Payer: Federal, State, Local not specified - PPO

## 2022-03-24 DIAGNOSIS — I441 Atrioventricular block, second degree: Secondary | ICD-10-CM

## 2022-03-24 MED ORDER — YOU HAVE A PACEMAKER BOOK
Freq: Once | Status: AC
  Filled 2022-03-24: qty 1

## 2022-03-24 NOTE — Discharge Instructions (Addendum)
After Your Pacemaker   You have a Abbott Pacemaker  ACTIVITY Do not lift your arm above shoulder height for 1 week after your procedure. After 7 days, you may progress as below.  You should remove your sling 24 hours after your procedure, unless otherwise instructed by your provider.     Saturday March 31, 2022  Sunday April 01, 2022 Monday April 02, 2022 Tuesday April 03, 2022   Do not lift, push, pull, or carry anything over 10 pounds with the affected arm until 6 weeks (Saturday May 05, 2022 ) after your procedure.   You may drive AFTER your wound check, unless you have been told otherwise by your provider.   Ask your healthcare provider when you can go back to work   INCISION/Dressing If you are on a blood thinner such as Coumadin, Xarelto, Eliquis, Plavix, or Pradaxa please confirm with your provider when this should be resumed.   If large square, outer bandage is left in place, this can be removed after 24 hours from your procedure. Do not remove steri-strips or glue as below.   Monitor your Pacemaker site for redness, swelling, and drainage. Call the device clinic at [phone] if you experience these symptoms or fever/chills.  If your incision is sealed with Steri-strips or staples, you may shower 7 days after your procedure or when told by your provider. Do not remove the steri-strips or let the shower hit directly on your site. You may wash around your site with soap and water.    If you were discharged in a sling, please do not wear this during the day more than 48 hours after your surgery unless otherwise instructed. This may increase the risk of stiffness and soreness in your shoulder.   Avoid lotions, ointments, or perfumes over your incision until it is well-healed.  You may use a hot tub or a pool AFTER your wound check appointment if the incision is completely closed.  Pacemaker Alerts:  Some alerts are vibratory and others beep. These are NOT  emergencies. Please call our office to let us know. If this occurs at night or on weekends, it can wait until the next business day. Send a remote transmission.  If your device is capable of reading fluid status (for heart failure), you will be offered monthly monitoring to review this with you.   DEVICE MANAGEMENT Remote monitoring is used to monitor your pacemaker from home. This monitoring is scheduled every 91 days by our office. It allows [place] to keep an eye on the functioning of your device to ensure it is working properly. You will routinely see your Electrophysiologist annually (more often if necessary).   You should receive your ID card for your new device in 4-8 weeks. Keep this card with you at all times once received. Consider wearing a medical alert bracelet or necklace.  Your Pacemaker may be MRI compatible. This will be discussed at your next office visit/wound check.  You should avoid contact with strong electric or magnetic fields.   Do not use amateur (ham) radio equipment or electric (arc) welding torches. MP3 player headphones with magnets should not be used. Some devices are safe to use if held at least 12 inches (30 cm) from your Pacemaker. These include power tools, lawn mowers, and speakers. If you are unsure if something is safe to use, ask your health care provider.  When using your cell phone, hold it to the ear that is on the opposite side from the  Pacemaker. Do not leave your cell phone in a pocket over the Pacemaker.  You may safely use electric blankets, heating pads, computers, and microwave ovens.  Call the office right away if: You have chest pain. You feel more short of breath than you have felt before. You feel more light-headed than you have felt before. Your incision starts to open up.  This information is not intended to replace advice given to you by your health care provider. Make sure you discuss any questions you have with your health care provider.

## 2022-03-24 NOTE — Discharge Summary (Cosign Needed)
ELECTROPHYSIOLOGY PROCEDURE DISCHARGE SUMMARY    Patient ID: [name],  MRN: [id number], DOB/AGE: [birth year] 71 y.o.  Admit date: 03/23/2022 Discharge date: 03/24/2022  Primary Care Physician: Tonia Ghent, MD  Primary Cardiologist: Janina Mayo, MD  Electrophysiologist: Dr. Quentin Ore   Primary Discharge Diagnosis:  Symptomatic second degree heart block status post pacemaker implantation this admission  Secondary Discharge Diagnosis:  HTN HLD DM  Allergies  Allergen Reactions   Beta Adrenergic Blockers     Would avoid given history of second-degree block.   Rosuvastatin Other (See Comments)    bradycardia     Procedures This Admission:  1.  Implantation of a Abbott Assurity MRI [id number], serial [id number], Right atrial lead (model [id number], serial [id number]), RV lead (model [id number], serial [id number])   There were no immediate post procedure complications.   2.  CXR on 03/24/2022 demonstrated no pneumothorax status post device implantation.       Brief HPI: [name] is a 71 y.o. male was referred to electrophysiology in the outpatient setting for  consideration of PPM implantation.  Past medical history includes DM, HTN, HLD, OSA.  The patient has had symptomatic AV block without reversible causes identified.  Risks, benefits, and alternatives to PPM implantation were reviewed with the patient who wished to proceed.   Hospital Course:  The patient was admitted and underwent implantation of a St. Jude dual chamber PPM with details as outlined above. He was monitored on telemetry overnight which demonstrated appropriate pacing.  Left chest was without hematoma or ecchymosis.  The device was interrogated and found to be functioning normally.  CXR was obtained and demonstrated no pneumothorax status post device implantation.  Wound care, arm mobility, and restrictions were reviewed with the patient.  The patient was examined and considered stable for discharge  to home.    Anticoagulation resumption This patient is not on anticoagulation        Physical Exam: Vitals:   03/23/22 2026 03/24/22 0014 03/24/22 0415 03/24/22 0817  BP: (!) 147/70 (!) 162/80 (!) 155/79   Pulse: 82 65 64   Resp: '18 16 18 18  '$ Temp: 98.9 F (37.2 C) 98.2 F (36.8 C) 98 F (36.7 C) 98.2 F (36.8 C)  TempSrc: Oral Oral Oral Oral  SpO2: 95% 95% 94%   Weight:      Height:        Exam: Per MD note   Labs:   Lab Results  Component Value Date   WBC 8.6 03/06/2022   HGB 14.4 03/06/2022   HCT 43.9 03/06/2022   MCV 95 03/06/2022   PLT 251 03/06/2022   No results for input(s): "NA", "K", "CL", "CO2", "BUN", "CREATININE", "CALCIUM", "PROT", "BILITOT", "ALKPHOS", "ALT", "AST", "GLUCOSE" in the last 168 hours.  Invalid input(s): "LABALBU"  Discharge Medications:  Allergies as of 03/24/2022       Reactions   Beta Adrenergic Blockers    Would avoid given history of second-degree block.   Rosuvastatin Other (See Comments)   bradycardia        Medication List     TAKE these medications    aspirin 81 MG chewable tablet Chew 1 tablet (81 mg total) by mouth daily.   benazepril 20 MG tablet Commonly known as: LOTENSIN Take 1 tablet (20 mg total) by mouth daily.   Fenofibric Acid 135 MG Cpdr TAKE 1 CAPSULE BY MOUTH EVERY DAY   fluticasone 0.005 % ointment Commonly known as: CUTIVATE Apply 1 Application  topically daily as needed (Rash).   folic acid 1 MG tablet Commonly known as: FOLVITE Take 1 tablet (1 mg total) by mouth daily.   metFORMIN 500 MG tablet Commonly known as: GLUCOPHAGE Take 1-2 tablets (500-1,000 mg total) by mouth 2 (two) times daily with a meal. What changed: how much to take   onetouch ultrasoft lancets Use as instructed to check sugar daily if needed.  E11.9.   Refresh Contacts Drops Soln Place 1 drop into both eyes daily.   rosuvastatin 20 MG tablet Commonly known as: CRESTOR Take 20 mg by mouth at bedtime.                Discharge Care Instructions  (From admission, onward)           Start     Ordered   03/24/22 0000  Discharge wound care:       Comments: Noted on AVS   03/24/22 1028            Disposition:  Discharge Instructions     Call MD for:  redness, tenderness, or signs of infection (pain, swelling, redness, odor or green/yellow discharge around incision site)   Complete by: As directed    Diet - low sodium heart healthy   Complete by: As directed    Discharge wound care:   Complete by: As directed    Noted on AVS   Increase activity slowly   Complete by: As directed        Follow-up [place]. [facility] Follow up on 04/05/2022.   Specialty: Cardiology Why: at 10am for your follow up wound care visit Contact information: [address], [address] [place] [address] [phone]        Vickie Epley, MD Follow up on 07/11/2022.   Specialties: Cardiology, Radiology Why: at 2:30pm for your follow up appt Contact information: [place] [address] [phone]                 Duration of Discharge Encounter: Greater than 30 minutes including physician time.  Signed, Reino Bellis, NP  03/24/2022 10:29 AM \

## 2022-03-24 NOTE — Progress Notes (Signed)
Rounding Note    Patient Name: [name] Date of Encounter: 03/24/2022  [place] Cardiologist: Janina Mayo, MD   Subjective   No chest pain or sob. Anxious to go home.  Inpatient Medications    Scheduled Meds:  benazepril  20 mg Oral Daily   metFORMIN  1,000 mg Oral BID WC   you have a pacemaker book   Does not apply Once   Continuous Infusions:  PRN Meds: acetaminophen, hydrALAZINE, ondansetron (ZOFRAN) IV, mouth rinse   Vital Signs    Vitals:   03/23/22 2026 03/24/22 0014 03/24/22 0415 03/24/22 0817  BP: (!) 147/70 (!) 162/80 (!) 155/79   Pulse: 82 65 64   Resp: '18 16 18 18  '$ Temp: 98.9 F (37.2 C) 98.2 F (36.8 C) 98 F (36.7 C) 98.2 F (36.8 C)  TempSrc: Oral Oral Oral Oral  SpO2: 95% 95% 94%   Weight:      Height:        Intake/Output Summary (Last 24 hours) at 03/24/2022 1006 Last data filed at 03/23/2022 1900 Gross per 24 hour  Intake 506.67 ml  Output --  Net 506.67 ml      03/23/2022    1:39 PM 03/06/2022    1:45 PM 01/12/2022    9:45 AM  Last 3 Weights  Weight (lbs) 175 lb 179 lb 181 lb  Weight (kg) 79.379 kg 81.194 kg 82.101 kg      Telemetry    NSR with ventricular pacing - Personally Reviewed  ECG    Nsr with ventricular pacing - Personally Reviewed  Physical Exam   GEN: No acute distress.   Neck: No JVD Cardiac: RRR, no murmurs, rubs, or gallops.  Respiratory: Clear to auscultation bilaterally. GI: Soft, nontender, non-distended  MS: No edema; No deformity. Neuro:  Nonfocal  Psych: Normal affect   Labs    High Sensitivity Troponin:  No results for input(s): "TROPONINIHS" in the last 720 hours.   ChemistryNo results for input(s): "NA", "K", "CL", "CO2", "GLUCOSE", "BUN", "CREATININE", "CALCIUM", "MG", "PROT", "ALBUMIN", "AST", "ALT", "ALKPHOS", "BILITOT", "GFRNONAA", "GFRAA", "ANIONGAP" in the last 168 hours.  Lipids No results for input(s): "CHOL", "TRIG", "HDL", "LABVLDL", "LDLCALC", "CHOLHDL" in  the last 168 hours.  HematologyNo results for input(s): "WBC", "RBC", "HGB", "HCT", "MCV", "MCH", "MCHC", "RDW", "PLT" in the last 168 hours. Thyroid No results for input(s): "TSH", "FREET4" in the last 168 hours.  BNPNo results for input(s): "BNP", "PROBNP" in the last 168 hours.  DDimer No results for input(s): "DDIMER" in the last 168 hours.   Radiology    DG Chest 2 View  Result Date: 03/24/2022 CLINICAL DATA:  [id number] Pacemaker [id number] EXAM: CHEST - 2 VIEW COMPARISON:  09/15/2021 FINDINGS: The mediastinal contours are within normal limits. No cardiomegaly. Interval insertion of left subclavian approach dual lead pacemaker leads in expected locations. The lungs are clear bilaterally without evidence of focal consolidation, pleural effusion, or pneumothorax. No acute osseous abnormality. Similar appearing cervical fusion hardware in place. IMPRESSION: Status post left subclavian vein approach dual lead pacemaker insertion without complicating features. Electronically Signed   By: Ruthann Cancer M.D.   On: 03/24/2022 08:59   EP PPM/ICD IMPLANT  Result Date: 03/23/2022  CONCLUSIONS:  1. Symptomatic second degree heart block  2. Dual chamber permanent pacemaker implant with left bundle area lead  3.  No early apparent complications.    Cardiac Studies   See above  Patient Profile     71 y.o.  male admitted with h/o CHB.  Assessment & Plan    CHB - he is doing well s/p PPM.  PPM - interrogation under my direction demonstrates normal DDD PM function. CXR looks good.  3. Disp. - ok for DC home with usual followup. For questions or updates, please contact [place] Please consult [email or web address] for contact info under     Signed, Cristopher Peru, MD  03/24/2022, 10:06 AM

## 2022-03-26 ENCOUNTER — Encounter (HOSPITAL_COMMUNITY): Payer: Self-pay | Admitting: Cardiology

## 2022-03-26 ENCOUNTER — Telehealth: Payer: Self-pay

## 2022-03-26 LAB — GLUCOSE, CAPILLARY: Glucose-Capillary: 154 mg/dL — ABNORMAL HIGH (ref 70–99)

## 2022-03-26 NOTE — Telephone Encounter (Signed)
Called patient for same day d/c. No answer, LMTCB.  Patient has been added to [facility] and is in [employer].

## 2022-03-26 NOTE — Telephone Encounter (Signed)
-----   Message from Shirley Friar, PA-C sent at 03/23/2022  3:18 PM EST ----- Regarding: Same Day Discharge PPM 03/23/22 Dr. Quentin Ore ; Planned to go home if timing worked

## 2022-03-27 NOTE — Telephone Encounter (Signed)
Follow-up after same day discharge: Implant date: 03/23/22 MD: Dr. Quentin Ore  Device: pacemaker Location: left chest   Wound check visit: 04/05/2022 at 10:00 am 90 day MD follow-up: scheduled  Remote Transmission received: yes  Dressing/sling removed: yes

## 2022-04-02 ENCOUNTER — Encounter: Payer: Self-pay | Admitting: Internal Medicine

## 2022-04-02 ENCOUNTER — Ambulatory Visit: Payer: Federal, State, Local not specified - PPO | Attending: Internal Medicine | Admitting: Internal Medicine

## 2022-04-02 VITALS — BP 170/80 | HR 80 | Ht 68.0 in | Wt 180.6 lb

## 2022-04-02 DIAGNOSIS — Z950 Presence of cardiac pacemaker: Secondary | ICD-10-CM | POA: Diagnosis not present

## 2022-04-02 MED ORDER — AMLODIPINE BESYLATE 10 MG PO TABS: 10.0000 mg | ORAL_TABLET | Freq: Every day | ORAL | 3 refills | Status: DC

## 2022-04-02 MED ORDER — ROSUVASTATIN CALCIUM 20 MG PO TABS: 20.0000 mg | ORAL_TABLET | Freq: Every day | ORAL | 3 refills | Status: DC

## 2022-04-02 MED ORDER — ROSUVASTATIN CALCIUM 40 MG PO TABS: 40.0000 mg | ORAL_TABLET | Freq: Every day | ORAL | 3 refills | Status: DC

## 2022-04-02 NOTE — Progress Notes (Signed)
Cardiology Office Note:    Date:  04/02/2022   ID:  [name], DOB July 02, 1950, MRN [id number]  PCP:  Johnny Ghent, MD   [facility] Providers Cardiologist:  Johnny Mayo, MD Electrophysiologist:  Johnny Epley, MD     Referring MD: Johnny Ghent, MD   No chief complaint on file. Post Stroke  History of Present Illness:    [name] is a 71 y.o. 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 He felt tingling in his left hand. He was admitted for an acute right thalamic CVA , started on DAPT for 21 days.   He has no cardiac hx. No chest pressure.No CHF symptoms. He denies dizziness or LH. No syncope  Interim hx 04/02/2022 Based on his cardiac monitor with symptomatic 2:1. He had an Abbott/St. Jude DC PPM was placed. BP high today.  He notes improved symptoms. No fevers, chills.   Cardiac studies:  EKG:  09/19/2021: NSR, RBBB, Wenckebach, 09/15/2021-Sinus rhythm , Wenckebach, RBBB 09/15/2021: 1440 NSR, RBBB, LAFB, Wenckebach  TTE 09/16/2021: normal LV/RV function, Grade 1DD. AV mean gradient 13 mmHg/calcified. Vmax 2.1 m/s  Cardiac Monitor 11/02/2021- Many auto triggered events for 2nd degree, mobitz type 2/2:1 AV block/Wenckebach.  No sinus pauses.   Past Medical History:  Diagnosis Date   DDD (degenerative disc disease), cervical    Diabetes mellitus without complication ([place])    History of chicken pox    History of skin cancer    Dr.Drew Ronnald Richard   Hx of adenomatous and sessile serrated colonic polyps 03/21/2017   Hyperlipidemia    [place] Study LDL goal = <130   Hypertension    Metabolic syndrome    Rosacea    Sleep apnea    does not use CPAP machine; treated with prev nasal surgery    Past Surgical History:  Procedure Laterality Date   CERVICAL FUSION     COLONOSCOPY  04/23/2004   Neg   COLONOSCOPY  2018   Dr.Gessner   LUMBAR LAMINECTOMY/DECOMPRESSION  MICRODISCECTOMY  05/09/2011   Procedure: LUMBAR LAMINECTOMY/DECOMPRESSION MICRODISCECTOMY;  Surgeon: Johnny Sinning, MD;  Location: [facility];  Service: Orthopedics;  Laterality: Left;  Decompressive laminectomy L2-3,  L3- 4,  L4-5 left, central    NASAL SINUS SURGERY     PACEMAKER IMPLANT N/A 03/23/2022   Procedure: PACEMAKER IMPLANT;  Surgeon: Johnny Epley, MD;  Location: [place] [facility];  Service: Cardiovascular;  Laterality: N/A;    Current Medications: Current Meds  Medication Sig   aspirin 81 MG chewable tablet Chew 1 tablet (81 mg total) by mouth daily.   benazepril (LOTENSIN) 20 MG tablet Take 1 tablet (20 mg total) by mouth daily.   Choline Fenofibrate (FENOFIBRIC ACID) 135 MG CPDR TAKE 1 CAPSULE BY MOUTH EVERY DAY   fluticasone (CUTIVATE) 0.005 % ointment Apply 1 Application topically daily as needed (Rash).   folic acid (FOLVITE) 1 MG tablet Take 1 tablet (1 mg total) by mouth daily.   Lancets (ONETOUCH ULTRASOFT) lancets Use as instructed to check sugar daily if needed.  E11.9.   metFORMIN (GLUCOPHAGE) 500 MG tablet Take 1-2 tablets (500-1,000 mg total) by mouth 2 (two) times daily with a meal. (Patient taking differently: Take 1,000 mg by mouth 2 (two) times daily with a meal.)   rosuvastatin (CRESTOR) 20 MG tablet Take 20 mg by mouth at bedtime.   Soft  Lens Products (REFRESH CONTACTS DROPS) SOLN Place 1 drop into both eyes daily.     Allergies:   Beta adrenergic blockers and Rosuvastatin   Social History   Socioeconomic History   Marital status: Married    Spouse name: Not on file   Number of children: Not on file   Years of education: Not on file   Highest education level: Not on file  Occupational History   Occupation: Comptroller  Tobacco Use   Smoking status: Former    Types: Cigarettes    Quit date: 04/24/1971    Years since quitting: 50.9    Passive exposure: Never   Smokeless tobacco: Never  Vaping Use   Vaping Use: Never used  Substance and  Sexual Activity   Alcohol use: No   Drug use: No   Sexual activity: Not on file  Other Topics Concern   Not on file  Social History Narrative   Retired, from post office   Married, 1973   2 sons (1 is local, 1 in Corporate treasurer reserve with prev deployments to [employer])   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     Working part time at [employer].     Social Determinants of Health   Financial Resource Strain: Not on file  Food Insecurity: No Food Insecurity (03/23/2022)   Hunger Vital Sign    Worried About Running Out of Food in the Last Year: Never true    Ran Out of Food in the Last Year: Never true  Transportation Needs: No Transportation Needs (03/23/2022)   PRAPARE - Hydrologist (Medical): No    Lack of Transportation (Non-Medical): No  Physical Activity: Not on file  Stress: Not on file  Social Connections: Not on file     Family History: The patient's family history includes Alzheimer's disease in his mother; Heart disease in his father and another family member. There is no history of Colon cancer, Prostate cancer, Stomach cancer, Colon polyps, Esophageal cancer, or Rectal cancer. Father had pacemaker.   ROS:   Please see the history of present illness.     All other systems reviewed and are negative.  EKGs/Labs/Other Studies Reviewed:    The following studies were reviewed today:   EKG:  EKG is  ordered today.  The ekg ordered today demonstrates   Prior ECG-NSR, 1st degree AV block, RBBB  04/02/2022- a sensed, V paced   Recent Labs: 09/15/2021: ALT 27; TSH 2.429 03/06/2022: BUN 16; Creatinine, Ser 1.12; Hemoglobin 14.4; Platelets 251; Potassium 4.1; Sodium 140   Recent Lipid Panel    Component Value Date/Time   CHOL 146 12/15/2021 0756   TRIG 174.0 (H) 12/15/2021 0756   TRIG 175 (H) 04/11/2006 0950   HDL 34.30 (L) 12/15/2021 0756   CHOLHDL 4 12/15/2021 0756   VLDL 34.8 12/15/2021 0756   LDLCALC 77 12/15/2021 0756   LDLDIRECT 127.0  07/07/2020 0749     Risk Assessment/Calculations:           Physical Exam:    VS:   Vitals:   04/02/22 1003  BP: (!) 170/80  Pulse: 80  SpO2: 97%      Wt Readings from Last 3 Encounters:  03/23/22 175 lb (79.4 kg)  03/06/22 179 lb (81.2 kg)  01/12/22 181 lb (82.1 kg)     GEN:  Well nourished, well developed in no acute distress HEENT: Normal NECK: No JVD; No carotid bruits LYMPHATICS: No lymphadenopathy CARDIAC:  RRR, SEM RUSB, rubs, gallops RESPIRATORY:  Clear to auscultation without rales, wheezing or rhonchi  ABDOMEN: Soft, non-tender, non-distended MUSCULOSKELETAL:  No edema; No deformity, PPM site clean and dry SKIN: Warm and dry NEUROLOGIC:  Alert and oriented x 3 PSYCHIATRIC:  Normal affect   ASSESSMENT:    #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 Will assess his 30 day event monitor. He is asymptomatic. He has no signs of ACS. His echo showed normal LV function. He saw EP and met criteria for PPM. Underwent DC- PPM per above.  #Stroke : had acute R thalamic CVA. He has a 30 day event monitor to assess for afib. He is on DAPT, then asa. Continue statin therapy. LDL 122, started crestor 20 mg daily in May. Goal < 11m/dL, increase crestor to 40, not at goal LDL 77  #HTN: start norvasc 10 mg daily today, continue benazepril 20 mg daily. BP goal closer to 130/80 mmhg   PLAN:    In order of problems listed above:   Follow up 6 months        Medication Adjustments/Labs and Tests Ordered: Current medicines are reviewed at length with the patient today.  Concerns regarding medicines are outlined above.  No orders of the defined types were placed in this encounter.  No orders of the defined types were placed in this encounter.   There are no Patient Instructions on file for this visit.   Signed, BJanina Mayo MD  04/02/2022 10:08 AM    [facility]

## 2022-04-02 NOTE — Patient Instructions (Signed)
Medication Instructions:  START: AMLODIPINE '10mg'$  ONCE DAILY START: ROSUVASTATIN '40mg'$  ONCE DAILY  *If you need a refill on your cardiac medications before your next appointment, please call your pharmacy*  Lab Work: None Ordered At This Time.  If you have labs (blood work) drawn today and your tests are completely normal, you will receive your results only by: [place] (if you have MyChart) OR A paper copy in the mail If you have any lab test that is abnormal or we need to change your treatment, we will call you to review the results.  Testing/Procedures: None Ordered At This Time.   Follow-Up: At [facility], you and your health needs are our priority.  As part of our continuing mission to provide you with exceptional heart care, we have created designated Provider Care Teams.  These Care Teams include your primary Cardiologist (physician) and Advanced Practice Providers (APPs -  Physician Assistants and Nurse Practitioners) who all work together to provide you with the care you need, when you need it.  Your next appointment:   6 month(s)  The format for your next appointment:   In Person  Provider:   Janina Mayo, MD

## 2022-04-05 ENCOUNTER — Ambulatory Visit: Payer: Federal, State, Local not specified - PPO | Attending: Internal Medicine

## 2022-04-05 DIAGNOSIS — I441 Atrioventricular block, second degree: Secondary | ICD-10-CM | POA: Diagnosis not present

## 2022-04-05 NOTE — Patient Instructions (Signed)

## 2022-04-06 LAB — CUP PACEART INCLINIC DEVICE CHECK
Battery Remaining Longevity: 128 mo
Battery Voltage: 3.05 V
Brady Statistic RA Percent Paced: 6.9 %
Brady Statistic RV Percent Paced: 99.96 %
Date Time Interrogation Session: 20231214100100
Implantable Lead Connection Status: 753985
Implantable Lead Connection Status: 753985
Implantable Lead Implant Date: 20231201
Implantable Lead Implant Date: 20231201
Implantable Lead Location: 753859
Implantable Lead Location: 753860
Implantable Pulse Generator Implant Date: 20231201
Lead Channel Impedance Value: 562.5 Ohm
Lead Channel Impedance Value: 637.5 Ohm
Lead Channel Pacing Threshold Amplitude: 0.5 V
Lead Channel Pacing Threshold Amplitude: 0.5 V
Lead Channel Pacing Threshold Amplitude: 0.75 V
Lead Channel Pacing Threshold Amplitude: 0.75 V
Lead Channel Pacing Threshold Pulse Width: 0.5 ms
Lead Channel Pacing Threshold Pulse Width: 0.5 ms
Lead Channel Pacing Threshold Pulse Width: 0.5 ms
Lead Channel Pacing Threshold Pulse Width: 0.5 ms
Lead Channel Sensing Intrinsic Amplitude: 2.7 mV
Lead Channel Sensing Intrinsic Amplitude: 4.5 mV
Lead Channel Setting Pacing Amplitude: 0.875
Lead Channel Setting Pacing Amplitude: 3.5 V
Lead Channel Setting Pacing Pulse Width: 0.5 ms
Lead Channel Setting Sensing Sensitivity: 2.5 mV
Pulse Gen Model: 2272
Pulse Gen Serial Number: 8111871

## 2022-04-06 NOTE — Progress Notes (Signed)

## 2022-04-28 ENCOUNTER — Other Ambulatory Visit: Payer: Self-pay | Admitting: Family Medicine

## 2022-05-21 ENCOUNTER — Encounter: Payer: Self-pay | Admitting: Family Medicine

## 2022-05-21 ENCOUNTER — Ambulatory Visit: Payer: Federal, State, Local not specified - PPO | Admitting: Family Medicine

## 2022-05-21 VITALS — BP 116/64 | HR 97 | Temp 98.3°F | Ht 68.0 in | Wt 177.0 lb

## 2022-05-21 DIAGNOSIS — E119 Type 2 diabetes mellitus without complications: Secondary | ICD-10-CM

## 2022-05-21 LAB — MICROALBUMIN / CREATININE URINE RATIO
Creatinine,U: 224.1 mg/dL
Microalb Creat Ratio: 3.1 mg/g (ref 0.0–30.0)
Microalb, Ur: 6.9 mg/dL — ABNORMAL HIGH (ref 0.0–1.9)

## 2022-05-21 LAB — POCT GLYCOSYLATED HEMOGLOBIN (HGB A1C): Hemoglobin A1C: 7.7 % — AB (ref 4.0–5.6)

## 2022-05-21 NOTE — Patient Instructions (Addendum)
Go to the lab on the way out.   If you have mychart we'll likely use that to update you.    Plan on recheck at a yearly visit in about 3-4 months.   Labs ahead of time if possible.   Don't change your meds yet.  Take care.  Glad to see you.

## 2022-05-21 NOTE — Progress Notes (Unsigned)
Diabetes:  Using medications without difficulties: 1 metformin BID.   Hypoglycemic episodes: no Hyperglycemic episodes: no Feet problems: no Blood Sugars averaging: 134 on most recent check, usually ~130s eye exam within last year: yes A1c 7.7.  d/w pt at OV.   He had pacer placement.  He feels better, has more exercise tolerance and faster pace with walking.    Meds, vitals, and allergies reviewed.   ROS: Per HPI unless specifically indicated in ROS section   GEN: nad, alert and oriented HEENT: ncat NECK: supple w/o LA CV: rrr. PULM: ctab, no inc wob ABD: soft, +bs EXT: no edema SKIN: no acute rash, pacer implant site well healed.    Check MALB and consider inc in metformin vs continued work on diet and exercise.

## 2022-05-23 NOTE — Assessment & Plan Note (Signed)
Discussed options. We agreed to check MALB and consider inc in metformin vs continued work on diet and exercise.   A1c 7.7.

## 2022-05-25 ENCOUNTER — Encounter: Payer: Self-pay | Admitting: Internal Medicine

## 2022-05-25 ENCOUNTER — Telehealth (INDEPENDENT_AMBULATORY_CARE_PROVIDER_SITE_OTHER): Payer: Federal, State, Local not specified - PPO | Admitting: Internal Medicine

## 2022-05-25 VITALS — BP 116/64 | Ht 68.0 in | Wt 177.0 lb

## 2022-05-25 DIAGNOSIS — U071 COVID-19: Secondary | ICD-10-CM

## 2022-05-25 MED ORDER — NIRMATRELVIR/RITONAVIR (PAXLOVID)TABLET: 3.0000 | ORAL_TABLET | Freq: Two times a day (BID) | ORAL | 0 refills | Status: AC

## 2022-05-25 NOTE — Assessment & Plan Note (Addendum)
Reasonably mild infection --had last vaccine Discussed symptom relief--tylenol, cough medication Discussed anti viral medication and he would like to take Paxlovid---needs to stop rosuvastatin and take 1/2 of amlodipine daily while on the paxlovid To ER if he gets sig shortness of breath Discussed isolation and then masking Wife had COVID last week

## 2022-05-25 NOTE — Progress Notes (Signed)
Subjective:    Patient ID: [name], male    DOB: 1951/02/10, 72 y.o.   MRN: [id number]  HPI Video virtual visit due to COVID infection Identification done Reviewed limitations and billing and he gave consent Participants--patient in his home and I am in my office  Started with cough and runny nose over the past 2 days Tested for COVID yesterday--positive No fever No shakes, chills Slight low back aching Slight headache No SOB  Has tried some OTC sinus meds---slight help  Current Outpatient Medications on File Prior to Visit  Medication Sig Dispense Refill   amLODipine (NORVASC) 10 MG tablet Take 1 tablet (10 mg total) by mouth daily. 90 tablet 3   aspirin 81 MG chewable tablet Chew 1 tablet (81 mg total) by mouth daily. 30 tablet 12   benazepril (LOTENSIN) 20 MG tablet Take 1 tablet (20 mg total) by mouth daily. 90 tablet 3   Choline Fenofibrate (FENOFIBRIC ACID) 135 MG CPDR TAKE 1 CAPSULE BY MOUTH EVERY DAY 90 capsule 3   fluticasone (CUTIVATE) 0.005 % ointment Apply 1 Application topically daily as needed (Rash).     folic acid (FOLVITE) 1 MG tablet Take 1 tablet (1 mg total) by mouth daily. 90 tablet 3   Lancets (ONETOUCH ULTRASOFT) lancets Use as instructed to check sugar daily if needed.  E11.9. 100 each 12   metFORMIN (GLUCOPHAGE) 500 MG tablet Take 1-2 tablets (500-1,000 mg total) by mouth 2 (two) times daily with a meal. (Patient taking differently: Take 1,000 mg by mouth 2 (two) times daily with a meal.) 360 tablet 3   rosuvastatin (CRESTOR) 40 MG tablet Take 1 tablet (40 mg total) by mouth at bedtime. 90 tablet 3   Soft Lens Products (REFRESH CONTACTS DROPS) SOLN Place 1 drop into both eyes daily.     No current facility-administered medications on file prior to visit.    Allergies  Allergen Reactions   Beta Adrenergic Blockers     Would avoid given history of second-degree block.    Past Medical History:  Diagnosis Date   DDD (degenerative disc  disease), cervical    Diabetes mellitus without complication ([place])    History of chicken pox    History of skin cancer    Dr.Drew Ronnald Ramp   Hx of adenomatous and sessile serrated colonic polyps 03/21/2017   Hyperlipidemia    Framingham Study LDL goal = <130   Hypertension    Metabolic syndrome    Rosacea    Sleep apnea    does not use CPAP machine; treated with prev nasal surgery    Past Surgical History:  Procedure Laterality Date   CERVICAL FUSION     COLONOSCOPY  04/23/2004   Neg   COLONOSCOPY  2018   Dr.Gessner   LUMBAR LAMINECTOMY/DECOMPRESSION MICRODISCECTOMY  05/09/2011   Procedure: LUMBAR LAMINECTOMY/DECOMPRESSION MICRODISCECTOMY;  Surgeon: Magnus Sinning, MD;  Location: [facility];  Service: Orthopedics;  Laterality: Left;  Decompressive laminectomy L2-3,  L3- 4,  L4-5 left, central    NASAL SINUS SURGERY     PACEMAKER IMPLANT N/A 03/23/2022   Procedure: PACEMAKER IMPLANT;  Surgeon: Vickie Epley, MD;  Location: [facility];  Service: Cardiovascular;  Laterality: N/A;    Family History  Problem Relation Age of Onset   Alzheimer's disease Mother    Heart disease Father        Pacemaker   Heart disease Other    Colon cancer Neg Hx    Prostate cancer  Neg Hx    Stomach cancer Neg Hx    Colon polyps Neg Hx    Esophageal cancer Neg Hx    Rectal cancer Neg Hx     Social History   Socioeconomic History   Marital status: Married    Spouse name: Not on file   Number of children: Not on file   Years of education: Not on file   Highest education level: Not on file  Occupational History   Occupation: Comptroller  Tobacco Use   Smoking status: Former    Types: Cigarettes    Quit date: 04/24/1971    Years since quitting: 51.1    Passive exposure: Never   Smokeless tobacco: Never  Vaping Use   Vaping Use: Never used  Substance and Sexual Activity   Alcohol use: No   Drug use: No   Sexual activity: Not on file  Other Topics Concern   Not on file   Social History Narrative   Retired, from post office   Married, 1973   2 sons (1 is local, 1 in Corporate treasurer reserve with prev deployments to [employer])   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     Working part time at [employer].     Social Determinants of Health   Financial Resource Strain: Not on file  Food Insecurity: No Food Insecurity (03/23/2022)   Hunger Vital Sign    Worried About Running Out of Food in the Last Year: Never true    Ran Out of Food in the Last Year: Never true  Transportation Needs: No Transportation Needs (03/23/2022)   PRAPARE - Hydrologist (Medical): No    Lack of Transportation (Non-Medical): No  Physical Activity: Not on file  Stress: Not on file  Social Connections: Not on file  Intimate Partner Violence: Not At Risk (03/23/2022)   Humiliation, Afraid, Rape, and Kick questionnaire    Fear of Current or Ex-Partner: No    Emotionally Abused: No    Physically Abused: No    Sexually Abused: No    Review of Systems Hasn't lost sense of smell or taste No N/V Is able to eat    Objective:   Physical Exam Constitutional:      Appearance: Normal appearance.  Pulmonary:     Effort: Pulmonary effort is normal. No respiratory distress.  Neurological:     Mental Status: He is alert.            Assessment & Plan:

## 2022-05-27 ENCOUNTER — Other Ambulatory Visit: Payer: Self-pay | Admitting: Family Medicine

## 2022-05-27 MED ORDER — METFORMIN HCL 500 MG PO TABS: ORAL_TABLET | ORAL | 3 refills | Status: DC

## 2022-05-27 MED ORDER — METFORMIN HCL 500 MG PO TABS: ORAL_TABLET | ORAL | Status: DC

## 2022-06-25 ENCOUNTER — Ambulatory Visit (INDEPENDENT_AMBULATORY_CARE_PROVIDER_SITE_OTHER): Payer: Federal, State, Local not specified - PPO

## 2022-06-25 DIAGNOSIS — I441 Atrioventricular block, second degree: Secondary | ICD-10-CM | POA: Diagnosis not present

## 2022-06-26 LAB — CUP PACEART REMOTE DEVICE CHECK
Battery Remaining Longevity: 106 mo
Battery Remaining Percentage: 95.5 %
Battery Voltage: 3.01 V
Brady Statistic AP VP Percent: 3.9 %
Brady Statistic AP VS Percent: 1 %
Brady Statistic AS VP Percent: 96 %
Brady Statistic AS VS Percent: 1 %
Brady Statistic RA Percent Paced: 3.9 %
Brady Statistic RV Percent Paced: 99 %
Date Time Interrogation Session: 20240304021831
Implantable Lead Connection Status: 753985
Implantable Lead Connection Status: 753985
Implantable Lead Implant Date: 20231201
Implantable Lead Implant Date: 20231201
Implantable Lead Location: 753859
Implantable Lead Location: 753860
Implantable Pulse Generator Implant Date: 20231201
Lead Channel Impedance Value: 550 Ohm
Lead Channel Impedance Value: 600 Ohm
Lead Channel Pacing Threshold Amplitude: 0.5 V
Lead Channel Pacing Threshold Amplitude: 0.625 V
Lead Channel Pacing Threshold Pulse Width: 0.5 ms
Lead Channel Pacing Threshold Pulse Width: 0.5 ms
Lead Channel Sensing Intrinsic Amplitude: 1.4 mV
Lead Channel Sensing Intrinsic Amplitude: 8.4 mV
Lead Channel Setting Pacing Amplitude: 0.875
Lead Channel Setting Pacing Amplitude: 3.5 V
Lead Channel Setting Pacing Pulse Width: 0.5 ms
Lead Channel Setting Sensing Sensitivity: 2.5 mV
Pulse Gen Model: 2272
Pulse Gen Serial Number: 8111871

## 2022-07-10 NOTE — Progress Notes (Unsigned)
  Electrophysiology Office Follow up Visit Note:    Date:  07/11/2022   ID:  [name], DOB 07-18-1950, MRN [id number]  PCP:  Tonia Ghent, MD  [facility] Cardiologist:  Janina Mayo, MD  [facility] Electrophysiologist:  Vickie Epley, MD    Interval History:    [name] is a 72 y.o. male who presents for a follow up visit.   He had a DDD PPM implated 03/23/2022. Remotes since implant have shown stable device function. He paces 99% in the ventricle.   He is with his wife today in clinic.  He has been doing well since his pacemaker implant.  He reports feeling much better after the pacemaker was implanted.     Past medical, surgical, social and family history were reviewed.  ROS:   Please see the history of present illness.    All other systems reviewed and are negative.  EKGs/Labs/Other Studies Reviewed:    The following studies were reviewed today:  07/11/2022 in clinic device interrogation personally reviewed Battery longevity 10.1 years Lead parameter stable Ventricular pacing greater than 99% Atrial pacing 4% 11 atrial high rate episodes, longest 14 seconds   EKG:  The ekg ordered today demonstrates a sensed, V paced.  Paced QRS 98 ms.   Physical Exam:    VS:  BP 138/68   Pulse 99   Ht 5\' 8"  (1.727 m)   Wt 182 lb 12.8 oz (82.9 kg)   SpO2 99%   BMI 27.79 kg/m     Wt Readings from Last 3 Encounters:  07/11/22 182 lb 12.8 oz (82.9 kg)  05/25/22 177 lb (80.3 kg)  05/21/22 177 lb (80.3 kg)     GEN:  Well nourished, well developed in no acute distress CARDIAC: RRR, no murmurs, rubs, gallops.  Pacemaker pocket well-healed. RESPIRATORY:  Clear to auscultation without rales, wheezing or rhonchi       ASSESSMENT:    1. Mobitz type 2 second degree AV block   2. Sinus node dysfunction (HCC)   3. RBBB   4. Pacemaker   5. Primary hypertension    PLAN:    In order of problems listed above:  #Symptomatic bradycardia #PPM  in situ Device functioning appropriately, continue remote monitoring.   #Hypertension At goal today.  Recommend checking blood pressures 1-2 times per week at home and recording the values.  Recommend bringing these recordings to the primary care physician.   Follow up 1year with APP.   Signed, Lars Mage, MD, [facility], [facility] 07/11/2022 3:01 PM    Electrophysiology [facility]

## 2022-07-11 ENCOUNTER — Encounter: Payer: Self-pay | Admitting: Cardiology

## 2022-07-11 ENCOUNTER — Ambulatory Visit: Payer: Federal, State, Local not specified - PPO | Attending: Cardiology | Admitting: Cardiology

## 2022-07-11 VITALS — BP 138/68 | HR 99 | Ht 68.0 in | Wt 182.8 lb

## 2022-07-11 DIAGNOSIS — Z950 Presence of cardiac pacemaker: Secondary | ICD-10-CM

## 2022-07-11 DIAGNOSIS — I495 Sick sinus syndrome: Secondary | ICD-10-CM | POA: Diagnosis not present

## 2022-07-11 DIAGNOSIS — I4510 Unspecified right bundle-branch block: Secondary | ICD-10-CM

## 2022-07-11 DIAGNOSIS — I10 Essential (primary) hypertension: Secondary | ICD-10-CM

## 2022-07-11 DIAGNOSIS — I441 Atrioventricular block, second degree: Secondary | ICD-10-CM

## 2022-07-11 LAB — CUP PACEART INCLINIC DEVICE CHECK
Date Time Interrogation Session: 20240320163418
Implantable Lead Connection Status: 753985
Implantable Lead Connection Status: 753985
Implantable Lead Implant Date: 20231201
Implantable Lead Implant Date: 20231201
Implantable Lead Location: 753859
Implantable Lead Location: 753860
Implantable Pulse Generator Implant Date: 20231201
Pulse Gen Model: 2272
Pulse Gen Serial Number: 8111871

## 2022-07-11 NOTE — Patient Instructions (Signed)
Medication Instructions:  Your physician recommends that you continue on your current medications as directed. Please refer to the Current Medication list given to you today. *If you need a refill on your cardiac medications before your next appointment, please call your pharmacy*   Follow-Up: At [facility], you and your health needs are our priority.  As part of our continuing mission to provide you with exceptional heart care, we have created designated Provider Care Teams.  These Care Teams include your primary Cardiologist (physician) and Advanced Practice Providers (APPs -  Physician Assistants and Nurse Practitioners) who all work together to provide you with the care you need, when you need it.  We recommend signing up for the patient portal called "MyChart".  Sign up information is provided on this After Visit Summary.  MyChart is used to connect with patients for Virtual Visits (Telemedicine).  Patients are able to view lab/test results, encounter notes, upcoming appointments, etc.  Non-urgent messages can be sent to your provider as well.   To learn more about what you can do with MyChart, go to [email or web address].    Your next appointment:   1 year(s)  Provider:   You will see one of the following Advanced Practice Providers on your designated Care Team:   Tommye Standard, [place] "[facility]" [place], [place]

## 2022-08-03 NOTE — Progress Notes (Signed)
Remote pacemaker transmission.   

## 2022-08-24 ENCOUNTER — Other Ambulatory Visit: Payer: Self-pay | Admitting: Family Medicine

## 2022-09-09 ENCOUNTER — Other Ambulatory Visit: Payer: Self-pay | Admitting: Family Medicine

## 2022-09-09 DIAGNOSIS — E119 Type 2 diabetes mellitus without complications: Secondary | ICD-10-CM

## 2022-09-13 ENCOUNTER — Other Ambulatory Visit (INDEPENDENT_AMBULATORY_CARE_PROVIDER_SITE_OTHER): Payer: Federal, State, Local not specified - PPO

## 2022-09-13 DIAGNOSIS — E119 Type 2 diabetes mellitus without complications: Secondary | ICD-10-CM | POA: Diagnosis not present

## 2022-09-13 LAB — LIPID PANEL
Cholesterol: 135 mg/dL (ref 0–200)
HDL: 36.8 mg/dL — ABNORMAL LOW (ref >39.00)
LDL Cholesterol: 65 mg/dL (ref 0–99)
NonHDL: 97.84
Total CHOL/HDL Ratio: 4
Triglycerides: 164 mg/dL — ABNORMAL HIGH (ref 0.0–149.0)
VLDL: 32.8 mg/dL (ref 0.0–40.0)

## 2022-09-13 LAB — COMPREHENSIVE METABOLIC PANEL
ALT: 21 U/L (ref 0–53)
AST: 25 U/L (ref 0–37)
Albumin: 4 g/dL (ref 3.5–5.2)
Alkaline Phosphatase: 36 U/L — ABNORMAL LOW (ref 39–117)
BUN: 23 mg/dL (ref 6–23)
CO2: 26 mEq/L (ref 19–32)
Calcium: 9.5 mg/dL (ref 8.4–10.5)
Chloride: 101 mEq/L (ref 96–112)
Creatinine, Ser: 1.01 mg/dL (ref 0.40–1.50)
GFR: 73.83 mL/min (ref >60.00)
Glucose, Bld: 191 mg/dL — ABNORMAL HIGH (ref 70–99)
Potassium: 4.1 mEq/L (ref 3.5–5.1)
Sodium: 135 mEq/L (ref 135–145)
Total Bilirubin: 0.6 mg/dL (ref 0.2–1.2)
Total Protein: 7.4 g/dL (ref 6.0–8.3)

## 2022-09-13 LAB — HEMOGLOBIN A1C: Hgb A1c MFr Bld: 8.5 % — ABNORMAL HIGH (ref 4.6–6.5)

## 2022-09-20 ENCOUNTER — Ambulatory Visit (INDEPENDENT_AMBULATORY_CARE_PROVIDER_SITE_OTHER): Payer: Federal, State, Local not specified - PPO | Admitting: Family Medicine

## 2022-09-20 ENCOUNTER — Encounter: Payer: Self-pay | Admitting: Family Medicine

## 2022-09-20 VITALS — BP 110/72 | HR 88 | Temp 97.9°F | Ht 68.0 in | Wt 175.0 lb

## 2022-09-20 DIAGNOSIS — E782 Mixed hyperlipidemia: Secondary | ICD-10-CM

## 2022-09-20 DIAGNOSIS — Z7984 Long term (current) use of oral hypoglycemic drugs: Secondary | ICD-10-CM | POA: Diagnosis not present

## 2022-09-20 DIAGNOSIS — E119 Type 2 diabetes mellitus without complications: Secondary | ICD-10-CM | POA: Diagnosis not present

## 2022-09-20 DIAGNOSIS — Z0000 Encounter for general adult medical examination without abnormal findings: Secondary | ICD-10-CM | POA: Diagnosis not present

## 2022-09-20 DIAGNOSIS — I10 Essential (primary) hypertension: Secondary | ICD-10-CM

## 2022-09-20 DIAGNOSIS — Z7189 Other specified counseling: Secondary | ICD-10-CM

## 2022-09-20 MED ORDER — ASPIRIN 81 MG PO CHEW: 81.0000 mg | CHEWABLE_TABLET | Freq: Every day | ORAL | 4 refills | Status: DC

## 2022-09-20 MED ORDER — METFORMIN HCL 500 MG PO TABS: ORAL_TABLET | ORAL | 3 refills | Status: DC

## 2022-09-20 MED ORDER — BENAZEPRIL HCL 20 MG PO TABS: 20.0000 mg | ORAL_TABLET | Freq: Every day | ORAL | 3 refills | Status: DC

## 2022-09-20 NOTE — Progress Notes (Signed)
CPE- See plan.  Routine anticipatory guidance given to patient.  See health maintenance.  The possibility exists that previously documented standard health maintenance information may have been brought forward from a previous encounter into this note.  If needed, that same information has been updated to reflect the current situation based on today's encounter.    covid vaccine prev done.   Tetanus 2009, d/w pt.   shingrix 2020 Flu prev done.   PNA up to date.  Colonoscopy 2022 Prostate cancer screening and PSA options (with potential risks and benefits of testing vs not testing) were discussed along with recent recs/guidelines.  He declined testing PSA at this point. Diet and exercise d/w pt.  Exercise- walking at work and at home.  Diet is good.    Living will d/w pt.  Wife designated if patient were incapacitated.   Hypertension:    Using medication without problems or lightheadedness: yes Chest pain with exertion:no Edema: no Short of breath: no Labs d/w pt.    Elevated Cholesterol: Using medications without problems:yes Muscle aches: no Diet compliance: yes Exercise: yes  He clearly feels better with prev pacer placement.  He has more energy.   Diabetes:  Using medications without difficulties:yes Hypoglycemic episodes:no Hyperglycemic episodes:no Feet problems: no Blood Sugars averaging: usually 130s.   eye exam within last year: yes A1c up from prior.  D/w pt at OV.  MALB positive prev, already on ACE.    PMH and SH reviewed  Meds, vitals, and allergies reviewed.   ROS: Per HPI.  Unless specifically indicated otherwise in HPI, the patient denies:  General: fever. Eyes: acute vision changes ENT: sore throat Cardiovascular: chest pain Respiratory: SOB GI: vomiting GU: dysuria Musculoskeletal: acute back pain Derm: acute rash Neuro: acute motor dysfunction Psych: worsening mood Endocrine: polydipsia Heme: bleeding Allergy: hayfever  GEN: nad, alert and  oriented HEENT: mucous membranes moist NECK: supple w/o LA CV: rrr. PULM: ctab, no inc wob ABD: soft, +bs EXT: no edema SKIN: no acute rash  Diabetic foot exam: Normal inspection No skin breakdown No calluses  Normal DP pulses Normal sensation to light touch and monofilament Nails normal

## 2022-09-20 NOTE — Patient Instructions (Addendum)
Tetanus shot may be cheaper at the pharmacy.  Take care.  Glad to see you. Go to the lab on the way out.   If you have mychart we'll likely use that to update you.    Plan on recheck in about 3 month likely with A1c at the visit.  You don't need to fast.

## 2022-09-23 ENCOUNTER — Other Ambulatory Visit: Payer: Self-pay | Admitting: Family Medicine

## 2022-09-23 DIAGNOSIS — E119 Type 2 diabetes mellitus without complications: Secondary | ICD-10-CM

## 2022-09-23 LAB — FRUCTOSAMINE: Fructosamine: 323 umol/L — ABNORMAL HIGH (ref 205–285)

## 2022-09-23 NOTE — Assessment & Plan Note (Addendum)
Plan on recheck in about 3 month likely with A1c at the visit.   See notes on fructosamine. A1c up from prior.  D/w pt at OV.  MALB positive prev, already on ACE.   Continue work on diet and exercise.  Continue benazepril metformin and Crestor.  Continue fenofibrate.

## 2022-09-23 NOTE — Assessment & Plan Note (Signed)
Continue work on diet and exercise.  Continue benazepril metformin and Crestor.  Continue fenofibrate. 

## 2022-09-23 NOTE — Assessment & Plan Note (Signed)
Continue work on diet and exercise.  Continue benazepril metformin and Crestor.  Continue fenofibrate.

## 2022-09-23 NOTE — Assessment & Plan Note (Signed)
covid vaccine prev done.   Tetanus 2009, d/w pt.   shingrix 2020 Flu prev done.   PNA up to date.  Colonoscopy 2022 Prostate cancer screening and PSA options (with potential risks and benefits of testing vs not testing) were discussed along with recent recs/guidelines.  He declined testing PSA at this point. Diet and exercise d/w pt.  Exercise- walking at work and at home.  Diet is good.    Living will d/w pt.  Wife designated if patient were incapacitated.

## 2022-09-23 NOTE — Assessment & Plan Note (Signed)
Living will d/w pt.  Wife designated if patient were incapacitated.   ?

## 2022-09-24 ENCOUNTER — Ambulatory Visit (INDEPENDENT_AMBULATORY_CARE_PROVIDER_SITE_OTHER): Payer: Federal, State, Local not specified - PPO

## 2022-09-24 DIAGNOSIS — I441 Atrioventricular block, second degree: Secondary | ICD-10-CM

## 2022-09-25 LAB — CUP PACEART REMOTE DEVICE CHECK
Battery Remaining Longevity: 115 mo
Battery Remaining Percentage: 95.5 %
Battery Voltage: 3.01 V
Brady Statistic AP VP Percent: 2.1 %
Brady Statistic AP VS Percent: 1 %
Brady Statistic AS VP Percent: 98 %
Brady Statistic AS VS Percent: 1 %
Brady Statistic RA Percent Paced: 2 %
Brady Statistic RV Percent Paced: 99 %
Date Time Interrogation Session: 20240603020016
Implantable Lead Connection Status: 753985
Implantable Lead Connection Status: 753985
Implantable Lead Implant Date: 20231201
Implantable Lead Implant Date: 20231201
Implantable Lead Location: 753859
Implantable Lead Location: 753860
Implantable Pulse Generator Implant Date: 20231201
Lead Channel Impedance Value: 510 Ohm
Lead Channel Impedance Value: 560 Ohm
Lead Channel Pacing Threshold Amplitude: 0.5 V
Lead Channel Pacing Threshold Amplitude: 0.875 V
Lead Channel Pacing Threshold Pulse Width: 0.5 ms
Lead Channel Pacing Threshold Pulse Width: 0.5 ms
Lead Channel Sensing Intrinsic Amplitude: 1.4 mV
Lead Channel Sensing Intrinsic Amplitude: 8.6 mV
Lead Channel Setting Pacing Amplitude: 1.125
Lead Channel Setting Pacing Amplitude: 2 V
Lead Channel Setting Pacing Pulse Width: 0.5 ms
Lead Channel Setting Sensing Sensitivity: 2.5 mV
Pulse Gen Model: 2272
Pulse Gen Serial Number: 8111871

## 2022-10-05 ENCOUNTER — Ambulatory Visit: Payer: Federal, State, Local not specified - PPO | Attending: Internal Medicine | Admitting: Internal Medicine

## 2022-10-05 VITALS — BP 142/82 | HR 81 | Ht 68.0 in | Wt 178.9 lb

## 2022-10-05 DIAGNOSIS — Z950 Presence of cardiac pacemaker: Secondary | ICD-10-CM | POA: Diagnosis not present

## 2022-10-05 DIAGNOSIS — I10 Essential (primary) hypertension: Secondary | ICD-10-CM | POA: Diagnosis not present

## 2022-10-05 DIAGNOSIS — I441 Atrioventricular block, second degree: Secondary | ICD-10-CM | POA: Diagnosis not present

## 2022-10-05 NOTE — Progress Notes (Signed)
Cardiology Office Note:    Date:  10/05/2022   ID:  [name], DOB 02-21-1951, MRN [id number]  PCP:  Joaquim Nam, MD   [facility] Providers Cardiologist:  Maisie Fus, MD Electrophysiologist:  Lanier Prude, MD     Referring MD: Joaquim Nam, MD   No chief complaint on file. Post Stroke  History of Present Illness:    [name] is a 72 y.o. 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 He felt tingling in his left hand. He was admitted for an acute right thalamic CVA , started on DAPT for 21 days.   He has no cardiac hx. No chest pressure.No CHF symptoms. He denies dizziness or LH. No syncope  Interim hx 04/02/2022 Based on his cardiac monitor with symptomatic 2:1. He had an Abbott/St. Jude DC PPM was placed. BP high today.  He notes improved symptoms. No fevers, chills.   Interim 10/05/2022 He is feeling well today.  No CP or SOB. No more syncope.  He walks a mile a day in the neighborhood. BP is well controlled at home.    Past Medical History:  Diagnosis Date   DDD (degenerative disc disease), cervical    Diabetes mellitus without complication (HCC)    History of chicken pox    History of skin cancer    Dr.[name]   Hx of adenomatous and sessile serrated colonic polyps 03/21/2017   Hyperlipidemia    Framingham Study LDL goal = <130   Hypertension    Metabolic syndrome    Rosacea    Sleep apnea    does not use CPAP machine; treated with prev nasal surgery    Past Surgical History:  Procedure Laterality Date   CERVICAL FUSION     COLONOSCOPY  04/23/2004   Neg   COLONOSCOPY  2018   Dr.Gessner   LUMBAR LAMINECTOMY/DECOMPRESSION MICRODISCECTOMY  05/09/2011   Procedure: LUMBAR LAMINECTOMY/DECOMPRESSION MICRODISCECTOMY;  Surgeon: Drucilla Schmidt, MD;  Location: [facility];  Service: Orthopedics;  Laterality: Left;  Decompressive laminectomy L2-3,  L3- 4,  L4-5 left,  central    NASAL SINUS SURGERY     PACEMAKER IMPLANT N/A 03/23/2022   Procedure: PACEMAKER IMPLANT;  Surgeon: Lanier Prude, MD;  [facility];  Service: Cardiovascular;  Laterality: N/A;    Current Medications: Current Meds  Medication Sig   amLODipine (NORVASC) 10 MG tablet Take 1 tablet (10 mg total) by mouth daily.   aspirin 81 MG chewable tablet Chew 1 tablet (81 mg total) by mouth daily.   benazepril (LOTENSIN) 20 MG tablet Take 1 tablet (20 mg total) by mouth daily.   Choline Fenofibrate (FENOFIBRIC ACID) 135 MG CPDR TAKE 1 CAPSULE BY MOUTH EVERY DAY   fluticasone (CUTIVATE) 0.005 % ointment Apply 1 Application topically daily as needed (Rash).   folic acid (FOLVITE) 1 MG tablet TAKE 1 TABLET BY MOUTH EVERY DAY   Lancets (ONETOUCH ULTRASOFT) lancets Use as instructed to check sugar daily if needed.  E11.9.   metFORMIN (GLUCOPHAGE) 500 MG tablet 2 tablets in the a.m. and 1 tablet in the p.m.   rosuvastatin (CRESTOR) 40 MG tablet Take 1 tablet (40 mg total) by mouth at bedtime.   Soft Lens Products (REFRESH CONTACTS DROPS) SOLN Place 1 drop into both eyes daily.     Allergies:   Beta adrenergic blockers   Social  History   Socioeconomic History   Marital status: Married    Spouse name: Not on file   Number of children: Not on file   Years of education: Not on file   Highest education level: Not on file  Occupational History   Occupation: Aeronautical engineer  Tobacco Use   Smoking status: Former    Types: Cigarettes    Quit date: 04/24/1971    Years since quitting: 51.4    Passive exposure: Never   Smokeless tobacco: Never  Vaping Use   Vaping Use: Never used  Substance and Sexual Activity   Alcohol use: No   Drug use: No   Sexual activity: Not on file  Other Topics Concern   Not on file  Social History Narrative   Retired, from post office   Married, 1973   2 sons (1 is local, 1 in Electronics engineer reserve with prev deployments to [employer])   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     Working part time at [employer].     Social Determinants of Health   Financial Resource Strain: Not on file  Food Insecurity: No Food Insecurity (03/23/2022)   Hunger Vital Sign    Worried About Running Out of Food in the Last Year: Never true    Ran Out of Food in the Last Year: Never true  Transportation Needs: No Transportation Needs (03/23/2022)   PRAPARE - Administrator, Civil Service (Medical): No    Lack of Transportation (Non-Medical): No  Physical Activity: Not on file  Stress: Not on file  Social Connections: Not on file     Family History: The patient's family history includes Alzheimer's disease in his mother; Heart disease in his father and another family member. There is no history of Colon cancer, Prostate cancer, Stomach cancer, Colon polyps, Esophageal cancer, or Rectal cancer. Father had pacemaker.   ROS:   Please see the history of present illness.     All other systems reviewed and are negative.  EKGs/Labs/Other Studies Reviewed:    The following studies were reviewed today:  EKG:  09/19/2021: NSR, RBBB, Wenckebach, 09/15/2021-Sinus rhythm , Wenckebach, RBBB 09/15/2021: 1440 NSR, RBBB, LAFB, Wenckebach  TTE 09/16/2021: normal LV/RV function, Grade 1DD. AV mean gradient 13 mmHg/calcified. Vmax 2.1 m/s  Cardiac Monitor 11/02/2021- Many auto triggered events for 2nd degree, mobitz type 2/2:1 AV block/Wenckebach.  No sinus pauses.   Recent Labs: 03/06/2022: Hemoglobin 14.4; Platelets 251 09/13/2022: ALT 21; BUN 23; Creatinine, Ser 1.01; Potassium 4.1; Sodium 135   Recent Lipid Panel    Component Value Date/Time   CHOL 135 09/13/2022 0749   TRIG 164.0 (H) 09/13/2022 0749   TRIG 175 (H) 04/11/2006 0950   HDL 36.80 (L) 09/13/2022 0749   CHOLHDL 4 09/13/2022 0749   VLDL 32.8 09/13/2022 0749   LDLCALC 65 09/13/2022 0749   LDLDIRECT 127.0 07/07/2020 0749     Risk Assessment/Calculations:           Physical  Exam:    VS:   Vitals:   10/05/22 0949  BP: (!) 142/82  Pulse: 81  SpO2: 98%    Wt Readings from Last 3 Encounters:  10/05/22 178 lb 14.4 oz (81.1 kg)  09/20/22 175 lb (79.4 kg)  07/11/22 182 lb 12.8 oz (82.9 kg)     GEN:  Well nourished, well developed in no acute distress HEENT: Normal NECK: No JVD; No carotid bruits CARDIAC: RRR, SEM RUSB, rubs, gallops RESPIRATORY:  Clear to auscultation without rales, wheezing or rhonchi  ABDOMEN: Soft, non-tender, non-distended MUSCULOSKELETAL:  No  LE edema SKIN: Warm and dry NEUROLOGIC:  Alert and oriented x 3 PSYCHIATRIC:  Normal affect   ASSESSMENT:    #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 S/p DC-PPM 03/23/2022 St Jude. - normal remote checks  #Ischemic Stroke : had acute R thalamic CVA. 30 day monitor did not show afib. He was on DAPT, then asa. Continue crestor 40 mg daily, LDL at goal.  #HTN: well controlled. continue norvasc 10 mg daily today, continue benazepril 20 mg daily. BP goal <130/80 mmhg   PLAN:    In order of problems listed above:   Follow up 6 months        Medication Adjustments/Labs and Tests Ordered: Current medicines are reviewed at length with the patient today.  Concerns regarding medicines are outlined above.  No orders of the defined types were placed in this encounter.  No orders of the defined types were placed in this encounter.   Patient Instructions  Medication Instructions:  No changes *If you need a refill on your cardiac medications before your next appointment, please call your pharmacy*  Follow-Up: At [facility], you and your health needs are our priority.  As part of our continuing mission to provide you with exceptional heart care, we have created designated Provider Care Teams.  These Care Teams include your primary Cardiologist (physician) and Advanced Practice  Providers (APPs -  Physician Assistants and Nurse Practitioners) who all work together to provide you with the care you need, when you need it.  We recommend signing up for the patient portal called "MyChart".  Sign up information is provided on this After Visit Summary.  MyChart is used to connect with patients for Virtual Visits (Telemedicine).  Patients are able to view lab/test results, encounter notes, upcoming appointments, etc.  Non-urgent messages can be sent to your provider as well.   To learn more about what you can do with MyChart, go to [email or web address].    Your next appointment:   6 month(s)  Provider:   Maisie Fus, MD       Signed, Maisie Fus, MD  10/05/2022 10:38 AM    [facility]

## 2022-10-05 NOTE — Patient Instructions (Signed)
Medication Instructions:  No changes *If you need a refill on your cardiac medications before your next appointment, please call your pharmacy*  Follow-Up: At [facility], you and your health needs are our priority.  As part of our continuing mission to provide you with exceptional heart care, we have created designated Provider Care Teams.  These Care Teams include your primary Cardiologist (physician) and Advanced Practice Providers (APPs -  Physician Assistants and Nurse Practitioners) who all work together to provide you with the care you need, when you need it.  We recommend signing up for the patient portal called "MyChart".  Sign up information is provided on this After Visit Summary.  MyChart is used to connect with patients for Virtual Visits (Telemedicine).  Patients are able to view lab/test results, encounter notes, upcoming appointments, etc.  Non-urgent messages can be sent to your provider as well.   To learn more about what you can do with MyChart, go to [email or web address].    Your next appointment:   6 month(s)  Provider:   Branch, Mary E, MD     

## 2022-10-16 NOTE — Progress Notes (Signed)
Remote pacemaker transmission.   

## 2022-11-11 ENCOUNTER — Other Ambulatory Visit: Payer: Self-pay | Admitting: Family Medicine

## 2022-11-11 DIAGNOSIS — R221 Localized swelling, mass and lump, neck: Secondary | ICD-10-CM

## 2022-11-11 NOTE — Progress Notes (Signed)
Please call patient.  Due for 1 year follow-up ultrasound regarding neck cyst.  I put in the order for that and he should get a call about scheduling.  Let me know if he is not contacted.  Thanks.

## 2022-11-12 ENCOUNTER — Telehealth: Payer: Self-pay | Admitting: Family Medicine

## 2022-11-12 NOTE — Telephone Encounter (Signed)
Pt called returning a missed call he stated he had from [place]. Didn't see any notes in his chart. Call back # [phone]

## 2022-11-12 NOTE — Progress Notes (Signed)
Left message to return call to our office.  

## 2022-11-12 NOTE — Telephone Encounter (Signed)
See other TE note. 

## 2022-11-12 NOTE — Progress Notes (Signed)
Patient notified he is due for f/u US and will get a call to schedule. Patient was fine with getting this done and will await call to schedule appt.

## 2022-11-13 ENCOUNTER — Encounter: Payer: Self-pay | Admitting: *Deleted

## 2022-11-21 ENCOUNTER — Encounter (INDEPENDENT_AMBULATORY_CARE_PROVIDER_SITE_OTHER): Payer: Self-pay

## 2022-12-03 ENCOUNTER — Ambulatory Visit
Admission: RE | Admit: 2022-12-03 | Discharge: 2022-12-03 | Disposition: A | Discharge status: Home / Self Care | Payer: Federal, State, Local not specified - PPO | Source: Ambulatory Visit | Attending: Family Medicine | Admitting: Family Medicine

## 2022-12-03 DIAGNOSIS — R221 Localized swelling, mass and lump, neck: Secondary | ICD-10-CM | POA: Insufficient documentation

## 2022-12-17 ENCOUNTER — Other Ambulatory Visit (INDEPENDENT_AMBULATORY_CARE_PROVIDER_SITE_OTHER): Payer: Federal, State, Local not specified - PPO

## 2022-12-17 DIAGNOSIS — E119 Type 2 diabetes mellitus without complications: Secondary | ICD-10-CM

## 2022-12-17 LAB — HEMOGLOBIN A1C: Hgb A1c MFr Bld: 8.1 % — ABNORMAL HIGH (ref 4.6–6.5)

## 2022-12-21 ENCOUNTER — Encounter: Payer: Self-pay | Admitting: Family Medicine

## 2022-12-21 ENCOUNTER — Ambulatory Visit: Payer: Federal, State, Local not specified - PPO | Admitting: Internal Medicine

## 2022-12-21 ENCOUNTER — Ambulatory Visit: Payer: Federal, State, Local not specified - PPO | Admitting: Family Medicine

## 2022-12-21 VITALS — BP 124/78 | HR 85 | Temp 98.2°F | Ht 68.0 in | Wt 174.0 lb

## 2022-12-21 DIAGNOSIS — Z7984 Long term (current) use of oral hypoglycemic drugs: Secondary | ICD-10-CM

## 2022-12-21 DIAGNOSIS — E119 Type 2 diabetes mellitus without complications: Secondary | ICD-10-CM | POA: Diagnosis not present

## 2022-12-21 DIAGNOSIS — R221 Localized swelling, mass and lump, neck: Secondary | ICD-10-CM | POA: Diagnosis not present

## 2022-12-21 LAB — FRUCTOSAMINE: Fructosamine: 315 umol/L — ABNORMAL HIGH (ref 205–285)

## 2022-12-21 NOTE — Assessment & Plan Note (Signed)
A1c vs fructosamine d/w pt.  fructosamine pending. Will update patient.  No change in metformin.  Continue work on exercise.  Diet d/w pt. See AVS.  Vaccines d/w pt.  Recheck in late Feb 2025 with labs 1 week ahead of time.

## 2022-12-21 NOTE — Patient Instructions (Addendum)
I would get a flu shot each fall.   Covid booster when possible.   Tdap when possible.  It may be cheaper at the pharmacy,   Recheck in late Feb 2025 with labs 1 week ahead of time.   Take care.  Glad to see you.

## 2022-12-21 NOTE — Progress Notes (Signed)
Diabetes:  Using medications without difficulties: yes Hypoglycemic episodes:no Hyperglycemic episodes:no Feet problems: no Blood Sugars averaging: 130-140 eye exam within last year: yes A1c vs fructosamine d/w pt.  fructosamine pending.  Diet d/w pt.   Expecting 3rd grandchild in 05/2023.    He feels better with pacer, has been walking 1-1.5 miles per day.  No CP.  Not SOB.  No BLE edema.   D/w pt about u/s.  The lesion near his jaw is similar to prior and I think we can recheck it with an ultrasound in 1 year.  He can update me as needed.  He doesn't feel the lesion.    Meds, vitals, and allergies reviewed.   ROS: Per HPI unless specifically indicated in ROS section   GEN: nad, alert and oriented HEENT: ncat NECK: supple w/o LA, no mass noted on exam.  CV: rrr. PULM: ctab, no inc wob ABD: soft, +bs EXT: no edema SKIN: no acute rash  Diabetic foot exam: Normal inspection No skin breakdown No calluses  Normal DP pulses Normal sensation to light touch and monofilament Nails normal

## 2022-12-21 NOTE — Assessment & Plan Note (Signed)
  D/w pt about u/s.  The lesion near his jaw is similar to prior and I think we can recheck it with an ultrasound in 1 year.  He can update me as needed.  He doesn't feel the lesion.

## 2022-12-25 ENCOUNTER — Ambulatory Visit (INDEPENDENT_AMBULATORY_CARE_PROVIDER_SITE_OTHER): Payer: Federal, State, Local not specified - PPO

## 2022-12-25 DIAGNOSIS — I441 Atrioventricular block, second degree: Secondary | ICD-10-CM

## 2022-12-26 LAB — CUP PACEART REMOTE DEVICE CHECK
Battery Remaining Longevity: 113 mo
Battery Remaining Percentage: 94 %
Battery Voltage: 3.01 V
Brady Statistic AP VP Percent: 2.7 %
Brady Statistic AP VS Percent: 1 %
Brady Statistic AS VP Percent: 97 %
Brady Statistic AS VS Percent: 1 %
Brady Statistic RA Percent Paced: 2.6 %
Brady Statistic RV Percent Paced: 99 %
Date Time Interrogation Session: 20240902020013
Implantable Lead Connection Status: 753985
Implantable Lead Connection Status: 753985
Implantable Lead Implant Date: 20231201
Implantable Lead Implant Date: 20231201
Implantable Lead Location: 753859
Implantable Lead Location: 753860
Implantable Pulse Generator Implant Date: 20231201
Lead Channel Impedance Value: 510 Ohm
Lead Channel Impedance Value: 530 Ohm
Lead Channel Pacing Threshold Amplitude: 0.5 V
Lead Channel Pacing Threshold Amplitude: 0.875 V
Lead Channel Pacing Threshold Pulse Width: 0.5 ms
Lead Channel Pacing Threshold Pulse Width: 0.5 ms
Lead Channel Sensing Intrinsic Amplitude: 1.6 mV
Lead Channel Sensing Intrinsic Amplitude: 8.8 mV
Lead Channel Setting Pacing Amplitude: 1.125
Lead Channel Setting Pacing Amplitude: 2 V
Lead Channel Setting Pacing Pulse Width: 0.5 ms
Lead Channel Setting Sensing Sensitivity: 2.5 mV
Pulse Gen Model: 2272
Pulse Gen Serial Number: 8111871

## 2023-01-04 NOTE — Progress Notes (Signed)
Remote pacemaker transmission.   

## 2023-03-03 ENCOUNTER — Other Ambulatory Visit: Payer: Self-pay | Admitting: Family Medicine

## 2023-03-17 ENCOUNTER — Other Ambulatory Visit: Payer: Self-pay | Admitting: Internal Medicine

## 2023-03-25 ENCOUNTER — Ambulatory Visit (INDEPENDENT_AMBULATORY_CARE_PROVIDER_SITE_OTHER): Payer: Federal, State, Local not specified - PPO

## 2023-03-25 DIAGNOSIS — I441 Atrioventricular block, second degree: Secondary | ICD-10-CM

## 2023-03-25 LAB — CUP PACEART REMOTE DEVICE CHECK
Battery Remaining Longevity: 110 mo
Battery Remaining Percentage: 91 %
Battery Voltage: 3.01 V
Brady Statistic AP VP Percent: 2.6 %
Brady Statistic AP VS Percent: 1 %
Brady Statistic AS VP Percent: 97 %
Brady Statistic AS VS Percent: 1 %
Brady Statistic RA Percent Paced: 2.6 %
Brady Statistic RV Percent Paced: 99 %
Date Time Interrogation Session: 20241202020015
Implantable Lead Connection Status: 753985
Implantable Lead Connection Status: 753985
Implantable Lead Implant Date: 20231201
Implantable Lead Implant Date: 20231201
Implantable Lead Location: 753859
Implantable Lead Location: 753860
Implantable Pulse Generator Implant Date: 20231201
Lead Channel Impedance Value: 510 Ohm
Lead Channel Impedance Value: 530 Ohm
Lead Channel Pacing Threshold Amplitude: 0.5 V
Lead Channel Pacing Threshold Amplitude: 0.875 V
Lead Channel Pacing Threshold Pulse Width: 0.5 ms
Lead Channel Pacing Threshold Pulse Width: 0.5 ms
Lead Channel Sensing Intrinsic Amplitude: 1.4 mV
Lead Channel Sensing Intrinsic Amplitude: 8.8 mV
Lead Channel Setting Pacing Amplitude: 1.125
Lead Channel Setting Pacing Amplitude: 2 V
Lead Channel Setting Pacing Pulse Width: 0.5 ms
Lead Channel Setting Sensing Sensitivity: 2.5 mV
Pulse Gen Model: 2272
Pulse Gen Serial Number: 8111871

## 2023-04-08 ENCOUNTER — Encounter: Payer: Self-pay | Admitting: Internal Medicine

## 2023-04-08 ENCOUNTER — Ambulatory Visit: Payer: Federal, State, Local not specified - PPO | Attending: Internal Medicine | Admitting: Internal Medicine

## 2023-04-08 VITALS — BP 132/68 | HR 94 | Resp 17 | Ht 68.0 in | Wt 178.0 lb

## 2023-04-08 DIAGNOSIS — I10 Essential (primary) hypertension: Secondary | ICD-10-CM | POA: Diagnosis not present

## 2023-04-08 DIAGNOSIS — I441 Atrioventricular block, second degree: Secondary | ICD-10-CM | POA: Diagnosis not present

## 2023-04-08 NOTE — Progress Notes (Signed)
Cardiology Office Note:    Date:  04/08/2023   ID:  [name], DOB 1950-10-03, MRN [id number]  PCP:  Joaquim Nam, MD   [facility] Providers Cardiologist:  Maisie Fus, MD Electrophysiologist:  Lanier Prude, MD     Referring MD: Joaquim Nam, MD   No chief complaint on file. Post Stroke  History of Present Illness:    [name] is a 72 y.o. 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 He felt tingling in his left hand. He was admitted for an acute right thalamic CVA , started on DAPT for 21 days.   He has no cardiac hx. No chest pressure.No CHF symptoms. He denies dizziness or LH. No syncope  Interim hx 04/02/2022 Based on his cardiac monitor with symptomatic 2:1. He had an Abbott/St. Jude DC PPM was placed. BP high today.  He notes improved symptoms. No fevers, chills.   Interim 10/05/2022 He is feeling well today.  No CP or SOB. No more syncope.  He walks a mile a day in the neighborhood. BP is well controlled at home.   Interval hx 04/08/2023 He feels much better with is pacemaker. No chest pain or SOB.    Past Medical History:  Diagnosis Date   DDD (degenerative disc disease), cervical    Diabetes mellitus without complication (HCC)    History of chicken pox    History of skin cancer    Dr.[name]   Hx of adenomatous and sessile serrated colonic polyps 03/21/2017   Hyperlipidemia    F[place] Study LDL goal = <130   Hypertension    Metabolic syndrome    Rosacea    Sleep apnea    does not use CPAP machine; treated with prev nasal surgery    Past Surgical History:  Procedure Laterality Date   CERVICAL FUSION     COLONOSCOPY  04/23/2004   Neg   COLONOSCOPY  2018   Dr.Gessner   LUMBAR LAMINECTOMY/DECOMPRESSION MICRODISCECTOMY  05/09/2011   Procedure: LUMBAR LAMINECTOMY/DECOMPRESSION MICRODISCECTOMY;  Surgeon: Drucilla Schmidt, MD;  Location: [facility];   Service: Orthopedics;  Laterality: Left;  Decompressive laminectomy L2-3,  L3- 4,  L4-5 left, central    NASAL SINUS SURGERY     PACEMAKER IMPLANT N/A 03/23/2022   Procedure: PACEMAKER IMPLANT;  Surgeon: Lanier Prude, MD;  Location: [facility];  Service: Cardiovascular;  Laterality: N/A;    Current Medications: Current Outpatient Medications on File Prior to Visit  Medication Sig Dispense Refill   amLODipine (NORVASC) 10 MG tablet TAKE 1 TABLET BY MOUTH EVERY DAY 90 tablet 3   aspirin 81 MG chewable tablet Chew 1 tablet (81 mg total) by mouth daily. 100 tablet 4   benazepril (LOTENSIN) 20 MG tablet Take 1 tablet (20 mg total) by mouth daily. 90 tablet 3   Choline Fenofibrate (FENOFIBRIC ACID) 135 MG CPDR TAKE 1 CAPSULE BY MOUTH EVERY DAY 90 capsule 3   fluticasone (CUTIVATE) 0.005 % ointment Apply 1 Application topically daily as needed (Rash).     folic acid (FOLVITE) 1 MG tablet TAKE 1 TABLET BY MOUTH EVERY DAY 90 tablet 3   Lancets (ONETOUCH ULTRASOFT) lancets Use as instructed to check sugar daily if needed.  E11.9. 100 each 12   metFORMIN (GLUCOPHAGE) 500 MG tablet TAKE 1-2 TABLETS (500-1,000 MG TOTAL) BY MOUTH 2 (TWO) TIMES DAILY WITH A  MEAL. 360 tablet 3   rosuvastatin (CRESTOR) 40 MG tablet TAKE 1 TABLET BY MOUTH EVERYDAY AT BEDTIME 90 tablet 3   Soft Lens Products (REFRESH CONTACTS DROPS) SOLN Place 1 drop into both eyes daily.     No current facility-administered medications on file prior to visit.     Allergies:   Beta adrenergic blockers   Social History   Socioeconomic History   Marital status: Married    Spouse name: Not on file   Number of children: Not on file   Years of education: Not on file   Highest education level: Not on file  Occupational History   Occupation: Aeronautical engineer  Tobacco Use   Smoking status: Former    Current packs/day: 0.00    Types: Cigarettes    Quit date: 04/24/1971    Years since quitting: 51.9    Passive exposure:  Never   Smokeless tobacco: Never  Vaping Use   Vaping status: Never Used  Substance and Sexual Activity   Alcohol use: No   Drug use: No   Sexual activity: Not on file  Other Topics Concern   Not on file  Social History Narrative   Retired, from post office   Married, 1973   2 sons (1 is local, 1 in Electronics engineer reserve with prev deployments to [employer])   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     Working part time at [employer].     Social Drivers of Corporate investment banker Strain: Not on file  Food Insecurity: No Food Insecurity (03/23/2022)   Hunger Vital Sign    Worried About Running Out of Food in the Last Year: Never true    Ran Out of Food in the Last Year: Never true  Transportation Needs: No Transportation Needs (03/23/2022)   PRAPARE - Administrator, Civil Service (Medical): No    Lack of Transportation (Non-Medical): No  Physical Activity: Not on file  Stress: Not on file  Social Connections: Not on file     Family History: The patient's family history includes Alzheimer's disease in his mother; Heart disease in his father and another family member. There is no history of Colon cancer, Prostate cancer, Stomach cancer, Colon polyps, Esophageal cancer, or Rectal cancer. Father had pacemaker.   ROS:   Please see the history of present illness.     All other systems reviewed and are negative.  EKGs/Labs/Other Studies Reviewed:    The following studies were reviewed today:  EKG:  09/19/2021: NSR, RBBB, Wenckebach, 09/15/2021-Sinus rhythm , Wenckebach, RBBB 09/15/2021: 1440 NSR, RBBB, LAFB, Wenckebach  TTE 09/16/2021: normal LV/RV function, Grade 1DD. AV mean gradient 13 mmHg/calcified. Vmax 2.1 m/s  Cardiac Monitor 11/02/2021- Many auto triggered events for 2nd degree, mobitz type 2/2:1 AV block/Wenckebach.  No sinus pauses.   EKG Interpretation Date/Time:  Monday April 08 2023 11:25:31 EST Ventricular Rate:  85 PR Interval:  164 QRS  Duration:  148 QT Interval:  400 QTC Calculation: 476 R Axis:   -47  Text Interpretation: Atrial-sensed ventricular-paced rhythm When compared with ECG of 15-Sep-2021 14:40, PREVIOUS ECG IS PRESENT Confirmed by Carolan Clines ([id number]) on 04/08/2023 11:29:16 AM   Recent Labs: 09/13/2022: ALT 21; BUN 23; Creatinine, Ser 1.01; Potassium 4.1; Sodium 135   Recent Lipid Panel    Component Value Date/Time   CHOL 135 09/13/2022 0749   TRIG 164.0 (H) 09/13/2022 0749   TRIG 175 (H) 04/11/2006 0950   HDL 36.80 (  L) 09/13/2022 0749   CHOLHDL 4 09/13/2022 0749   VLDL 32.8 09/13/2022 0749   LDLCALC 65 09/13/2022 0749   LDLDIRECT 127.0 07/07/2020 0749     Risk Assessment/Calculations:           Physical Exam:    VS:   Vitals:   04/08/23 1122 04/08/23 1127  BP: (!) 150/70 132/68  Pulse: 94   Resp: 17   SpO2: 98%      Wt Readings from Last 3 Encounters:  12/21/22 174 lb (78.9 kg)  10/05/22 178 lb 14.4 oz (81.1 kg)  09/20/22 175 lb (79.4 kg)     GEN:  Well nourished, well developed in no acute distress HEENT: Normal NECK: No JVD; No carotid bruits CARDIAC: RRR, SEM RUSB, rubs, gallops RESPIRATORY:  Clear to auscultation without rales, wheezing or rhonchi  ABDOMEN: Soft, non-tender, non-distended MUSCULOSKELETAL:  No  LE edema SKIN: Warm and dry NEUROLOGIC:  Alert and oriented x 3 PSYCHIATRIC:  Normal affect   ASSESSMENT:    #Symptomatic second degree block/ S/p DC-PPM: He has Wenckebach, 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 S/p DC-PPM 03/23/2022 St Jude. - normal remote checks  #Ischemic Stroke : had acute R thalamic CVA. 30 day monitor did not show afib/ no Afib on remote device checks. He was on DAPT, then asa. Continue crestor 40 mg daily, LDL at goal.  #HTN: well controlled. continue norvasc 10 mg daily today, continue benazepril 20 mg daily. BP goal <130/80 mmhg   PLAN:    In order of problems listed above:   No  changes Follow up 12 months        Medication Adjustments/Labs and Tests Ordered: Current medicines are reviewed at length with the patient today.  Concerns regarding medicines are outlined above.  No orders of the defined types were placed in this encounter.  No orders of the defined types were placed in this encounter.   There are no Patient Instructions on file for this visit.   Signed, Maisie Fus, MD  04/08/2023 11:15 AM    [facility]

## 2023-04-08 NOTE — Patient Instructions (Addendum)
Medication Instructions:  No chnages  *If you need a refill on your cardiac medications before your next appointment, please call your pharmacy*   Follow-Up: At [facility], you and your health needs are our priority.  As part of our continuing mission to provide you with exceptional heart care, we have created designated Provider Care Teams.  These Care Teams include your primary Cardiologist (physician) and Advanced Practice Providers (APPs -  Physician Assistants and Nurse Practitioners) who all work together to provide you with the care you need, when you need it.   Your next appointment:   1 year(s)  Provider:   Maisie Fus, MD

## 2023-04-13 ENCOUNTER — Other Ambulatory Visit: Payer: Self-pay | Admitting: Family Medicine

## 2023-05-15 ENCOUNTER — Ambulatory Visit
Admission: EM | Admit: 2023-05-15 | Discharge: 2023-05-15 | Disposition: A | Discharge status: Home / Self Care | Payer: Federal, State, Local not specified - PPO | Attending: Emergency Medicine | Admitting: Emergency Medicine

## 2023-05-15 DIAGNOSIS — S46811A Strain of other muscles, fascia and tendons at shoulder and upper arm level, right arm, initial encounter: Secondary | ICD-10-CM | POA: Diagnosis not present

## 2023-05-15 DIAGNOSIS — M542 Cervicalgia: Secondary | ICD-10-CM | POA: Diagnosis not present

## 2023-05-15 MED ORDER — BACLOFEN 5 MG PO TABS: 5.0000 mg | ORAL_TABLET | Freq: Every evening | ORAL | 0 refills | Status: DC | PRN

## 2023-05-15 MED ORDER — PREDNISONE 20 MG PO TABS: 40.0000 mg | ORAL_TABLET | Freq: Every day | ORAL | 0 refills | Status: DC

## 2023-05-15 MED ORDER — KETOROLAC TROMETHAMINE 30 MG/ML IJ SOLN
30.0000 mg | Freq: Once | INTRAMUSCULAR | Status: AC
Start: 2023-05-15 — End: 2023-05-15
  Administered 2023-05-15: 30 mg via INTRAMUSCULAR

## 2023-05-15 NOTE — ED Triage Notes (Signed)
Triaged by provider  

## 2023-05-15 NOTE — ED Provider Notes (Signed)
[name]    CSN: [id number] Arrival date & time: 05/15/23  1203      History   Chief Complaint No chief complaint on file.   HPI [name] is a 73 y.o. male.   Patient presents for evaluation of right sided neck and shoulder pain beginning 1 day ago after pushing pulling and lifting.  Endorses symptoms have been constant , pain begins in the shoulder and radiates into the neck.  Exacerbated when lying down and when areas touch.  When arm is lifted above the head causing a pulling sensation.  Has attempted use of Tylenol and Tiger balm which has been ineffective.  Denies numbness or tingling, direct injury.  Past Medical History:  Diagnosis Date   DDD (degenerative disc disease), cervical    Diabetes mellitus without complication (HCC)    History of chicken pox    History of skin cancer    Dr.[name]   Hx of adenomatous and sessile serrated colonic polyps 03/21/2017   Hyperlipidemia    F[place]ingham Study LDL goal = <130   Hypertension    Metabolic syndrome    Rosacea    Sleep apnea    does not use CPAP machine; treated with prev nasal surgery    Patient Active Problem List   Diagnosis Date Noted   Second degree heart block 03/24/2022   Right thalamic infarction (HCC) 11/07/2021   Neck mass 09/24/2021   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   History of ischemic stroke 09/15/2021   Spinal stenosis at T7-T8 due to disc protrusion  09/15/2021   Facial mass 09/15/2021   Groin strain 09/27/2019   Hx of adenomatous and sessile serrated colonic polyps 03/21/2017   Routine general medical examination at a health care facility 12/14/2013   Advance care planning 12/14/2013   Back pain 09/29/2012   SKIN CANCER, HX OF 10/27/2009   CERVICAL RADICULOPATHY, RIGHT 11/15/2008   Type 2 diabetes mellitus without complications (HCC) 09/17/2008   HYPERLIPIDEMIA 02/04/2007   Essential hypertension 02/04/2007    Past Surgical History:  Procedure  Laterality Date   CERVICAL FUSION     COLONOSCOPY  04/23/2004   Neg   COLONOSCOPY  2018   Dr.Gessner   LUMBAR LAMINECTOMY/DECOMPRESSION MICRODISCECTOMY  05/09/2011   Procedure: LUMBAR LAMINECTOMY/DECOMPRESSION MICRODISCECTOMY;  Surgeon: Drucilla Schmidt, MD;  Location: [facility];  Service: Orthopedics;  Laterality: Left;  Decompressive laminectomy L2-3,  L3- 4,  L4-5 left, central    NASAL SINUS SURGERY     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 Authorizing Provider  baclofen 5 MG TABS Take 1 tablet (5 mg total) by mouth at bedtime as needed for muscle spasms. 05/15/23  Yes Shyan Scalisi R, NP  predniSONE (DELTASONE) 20 MG tablet Take 2 tablets (40 mg total) by mouth daily. 05/15/23  Yes Jazzmine Kleiman R, NP  amLODipine (NORVASC) 10 MG tablet TAKE 1 TABLET BY MOUTH EVERY DAY 03/18/23   Maisie Fus, MD  aspirin 81 MG chewable tablet Chew 1 tablet (81 mg total) by mouth daily. 09/20/22   Joaquim Nam, MD  benazepril (LOTENSIN) 20 MG tablet Take 1 tablet (20 mg total) by mouth daily. 09/20/22   Joaquim Nam, MD  Choline Fenofibrate (FENOFIBRIC ACID) 135 MG CPDR TAKE 1 CAPSULE BY MOUTH EVERY DAY 04/15/23   Joaquim Nam, MD  fluticasone (CUTIVATE) 0.005 % ointment Apply 1 Application topically daily as needed (Rash). 09/28/21   [provider]  folic acid (FOLVITE) 1 MG tablet TAKE 1 TABLET BY MOUTH EVERY DAY 08/24/22   Joaquim Nam, MD  Lancets [facility] ULTRASOFT) lancets Use as instructed to check sugar daily if needed.  E11.9. 01/27/21   Joaquim Nam, MD  metFORMIN (GLUCOPHAGE) 500 MG tablet TAKE 1-2 TABLETS (500-1,000 MG TOTAL) BY MOUTH 2 (TWO) TIMES DAILY WITH A MEAL. 03/04/23   Joaquim Nam, MD  rosuvastatin (CRESTOR) 40 MG  tablet TAKE 1 TABLET BY MOUTH EVERYDAY AT BEDTIME 03/18/23   Maisie Fus, MD  Soft Lens Products (REFRESH CONTACTS DROPS) SOLN Place 1 drop into both eyes daily.    [provider]    Family History Family History  Problem Relation Age of Onset   Alzheimer's disease Mother    Heart disease Father        Pacemaker   Heart disease Other    Colon cancer Neg Hx    Prostate cancer Neg Hx    Stomach cancer Neg Hx    Colon polyps Neg Hx    Esophageal cancer Neg Hx    Rectal cancer Neg Hx     Social History Social History   Tobacco Use   Smoking status: Former    Current packs/day: 0.00    Types: Cigarettes    Quit date: 04/24/1971    Years since quitting: 52.0    Passive exposure: Never   Smokeless tobacco: Never  Vaping Use   Vaping status: Never Used  Substance Use Topics   Alcohol use: No   Drug use: No     Allergies   Beta adrenergic blockers   Review of Systems Review of Systems   Physical Exam Triage Vital Signs ED Triage Vitals [05/15/23 1229]  Encounter Vitals Group     BP (!) 157/89     Systolic BP Percentile      Diastolic BP Percentile      Pulse Rate 95     Resp 18     Temp 97.9 F (36.6 C)     Temp Source Oral     SpO2 98 %     Weight      Height      Head Circumference      Peak Flow      Pain Score      Pain Loc      Pain Education      Exclude from Growth Chart    No data found.  Updated Vital Signs BP (!) 157/89   Pulse 95   Temp 97.9 F (36.6 C) (Oral)   Resp 18   SpO2 98%   Visual Acuity Right Eye Distance:   Left Eye Distance:   Bilateral Distance:    Right Eye Near:   Left Eye Near:    Bilateral Near:     Physical Exam Constitutional:      Appearance: Normal appearance.  Eyes:     Extraocular Movements: Extraocular movements intact.  Neck:     Comments: Tenderness present to the right lateral aspect of the neck, no spinal cervical tenderness noted, able to complete full range of motion pain elicited  with lateral turning, no ecchymosis, deformity, crepitus or rigidity, 2+ carotid pulses Pulmonary:     Effort: Pulmonary  effort is normal.  Musculoskeletal:     Comments: tenderness is present over the right thoracic region without ecchymosis swelling or deformity, has full range of motion of the right shoulder, pain elicited with abduction, 2+ brachial pulse, negative Hoffmann sign, strength is a 5 out of 5  Neurological:     Mental Status: He is alert and oriented to person, place, and time. Mental status is at baseline.      UC Treatments / Results  Labs (all labs ordered are listed, but only abnormal results are displayed) Labs Reviewed - No data to display  EKG   Radiology No results found.  Procedures Procedures (including critical care time)  Medications Ordered in UC Medications  ketorolac (TORADOL) 30 MG/ML injection 30 mg (30 mg Intramuscular Given 05/15/23 1230)    Initial Impression / Assessment and Plan / UC Course  I have reviewed the triage vital signs and the nursing notes.  Pertinent labs & imaging results that were available during my care of the patient were reviewed by me and considered in my medical decision making (see chart for details).  Neck Pain on right side, strain of right trapezius muscle, initial encounter  etiology most likely muscular, will no spinal tenderness nor any injury therefore imaging deferred, stable for outpatient management, Toradol IM given and prescribed prednisone and baclofen for home use recommended RICE, heat massage stretching with activity as tolerated, walking referral given to orthopedics Final Clinical Impressions(s) / UC Diagnoses   Final diagnoses:  Neck pain on right side  Strain of right trapezius muscle, initial encounter     Discharge Instructions      Your pain is most likely caused by irritation to the muscles.  You have been given an injection of Toradol which helps to reduce inflammation and pain and  ideally will start to see improvement within the hour  Starting tomorrow take prednisone every morning with food as directed, you may take Tylenol or any topical medicines in addition to this  May use baclofen at bedtime as needed for additional comfort  You may use heating pad in 15 minute intervals as needed for additional comfort, or  you may find comfort in using ice in 10-15 minutes over affected area  Begin massaging and  stretching affected area daily for 10 minutes as tolerated to further loosen muscles   When sitting and lying down place pillow underneath arms and behind back  Can try sleeping without pillow on firm mattress as this keeps the spine in alignment  Practice good posture: head back, shoulders back, chest forward, pelvis back and weight distributed evenly on both legs  If pain persist after recommended treatment or reoccurs if may be beneficial to follow up with orthopedic specialist for evaluation, this doctor specializes in the bones and can manage your symptoms long-term with options such as but not limited to imaging, medications or physical therapy      ED Prescriptions     Medication Sig Dispense Auth. Provider   predniSONE (DELTASONE) 20 MG tablet Take 2 tablets (40 mg total) by mouth daily. 10 tablet Kassidy Frankson R, NP   baclofen 5 MG TABS Take 1 tablet (5 mg total) by mouth at bedtime as needed for muscle spasms. 10 tablet Valinda Hoar, NP      PDMP not reviewed this encounter.   Valinda Hoar, NP 05/15/23 1254

## 2023-05-15 NOTE — Discharge Instructions (Signed)
Your pain is most likely caused by irritation to the muscles.  You have been given an injection of Toradol which helps to reduce inflammation and pain and ideally will start to see improvement within the hour  Starting tomorrow take prednisone every morning with food as directed, you may take Tylenol or any topical medicines in addition to this  May use baclofen at bedtime as needed for additional comfort  You may use heating pad in 15 minute intervals as needed for additional comfort, or  you may find comfort in using ice in 10-15 minutes over affected area  Begin massaging and  stretching affected area daily for 10 minutes as tolerated to further loosen muscles   When sitting and lying down place pillow underneath arms and behind back  Can try sleeping without pillow on firm mattress as this keeps the spine in alignment  Practice good posture: head back, shoulders back, chest forward, pelvis back and weight distributed evenly on both legs  If pain persist after recommended treatment or reoccurs if may be beneficial to follow up with orthopedic specialist for evaluation, this doctor specializes in the bones and can manage your symptoms long-term with options such as but not limited to imaging, medications or physical therapy

## 2023-06-06 IMAGING — MR MR CERVICAL SPINE W/O CM
4 of 5 series · 17 of 48 positions shown · non-contrast
Comparison: Cervical spine MRI 11/18/2008
COMPARISON: Cervical spine MRI 11/18/2008

Addendum:
CLINICAL DATA: Ataxia, nontraumatic, cervical pathology suspected.
Left-sided tingling.

EXAM:
MRI CERVICAL SPINE WITHOUT CONTRAST
TECHNIQUE: Multiplanar, multisequence MR imaging of the cervical spine was
performed. No intravenous contrast was administered.

[Series 12: T2 · sagittal · 3.0mm · 0.43mm/px · 6 of 13 slices shown (1 of 2)]
[im 1/13]
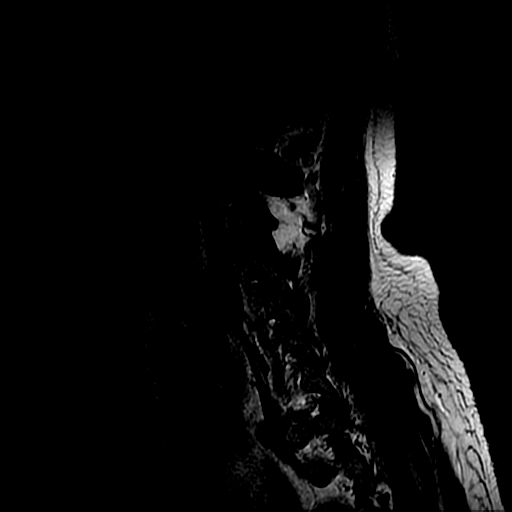
[im 3/13]
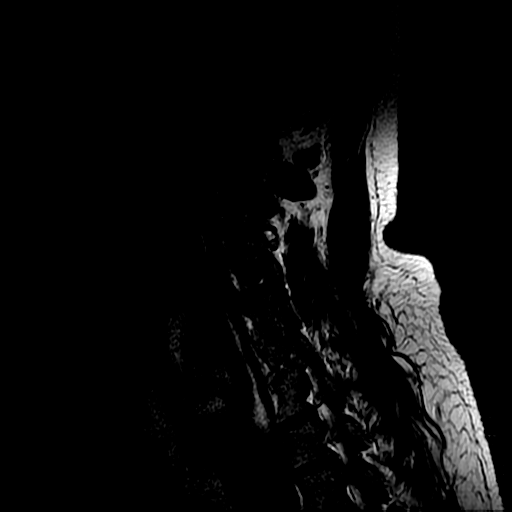
[im 5/13]
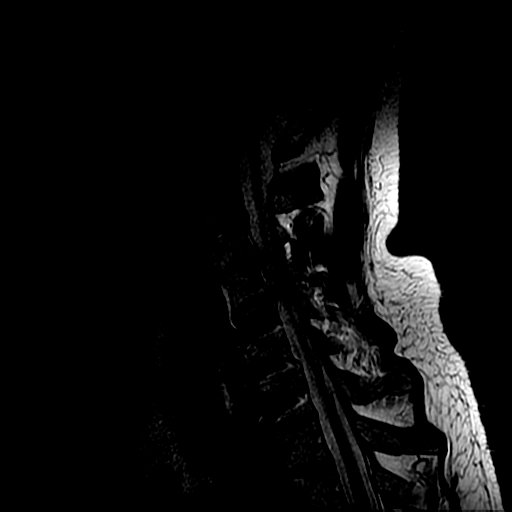
[im 8/13]
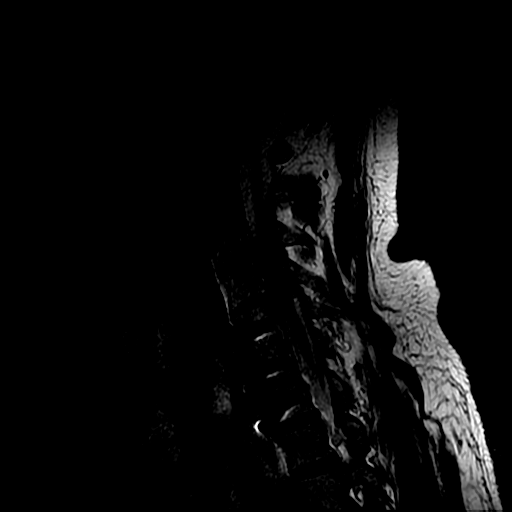
[im 10/13]
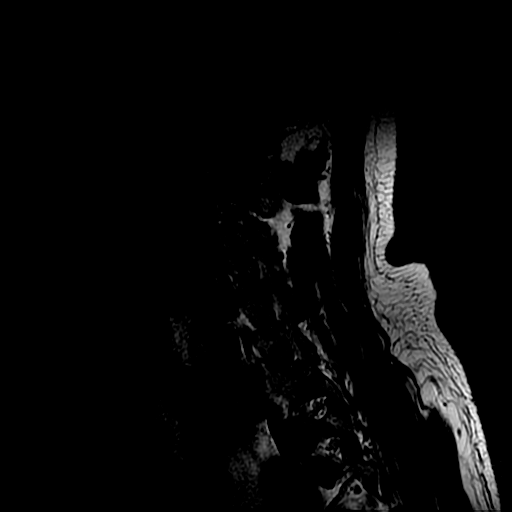
[im 13/13]
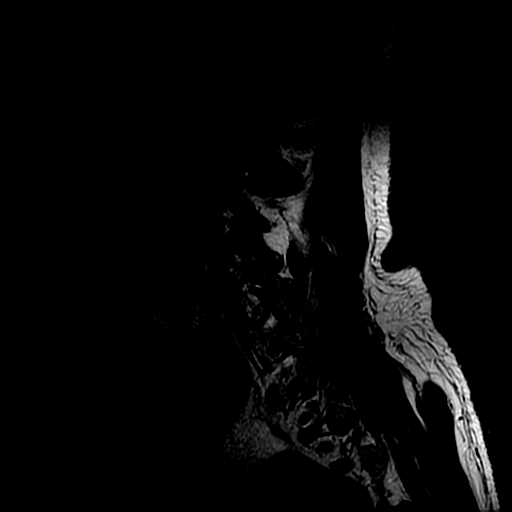

[Series 13: T1 · sagittal · 3.0mm · 0.43mm/px · 3 of 13 slices shown]
[im 3/13]
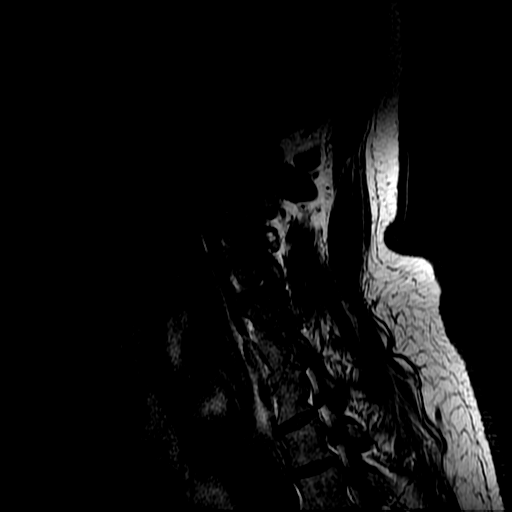
[im 8/13]
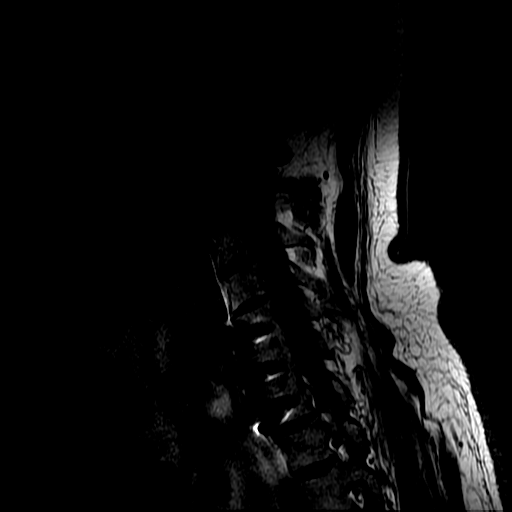
[im 13/13]
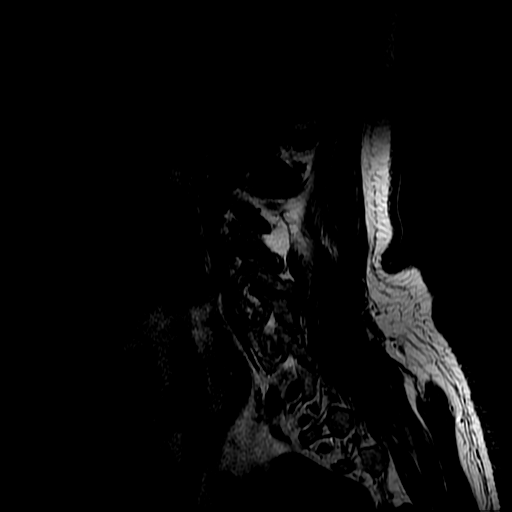

[Series 14: sag ir · sagittal · 3.0mm · 0.43mm/px · 3 of 13 slices shown]
[im 3/13]
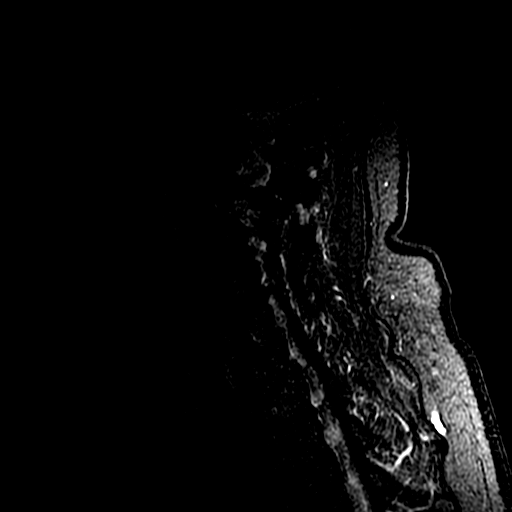
[im 8/13]
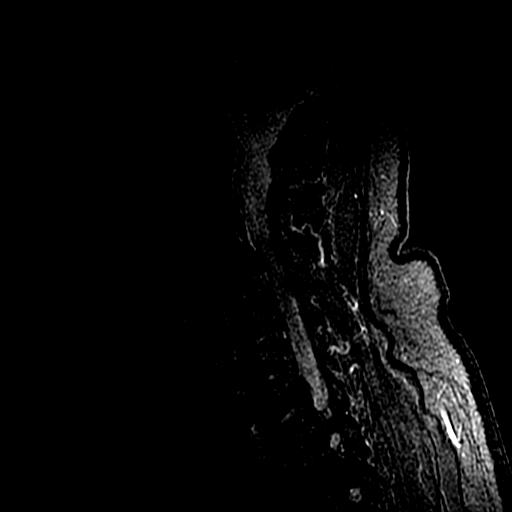
[im 13/13]
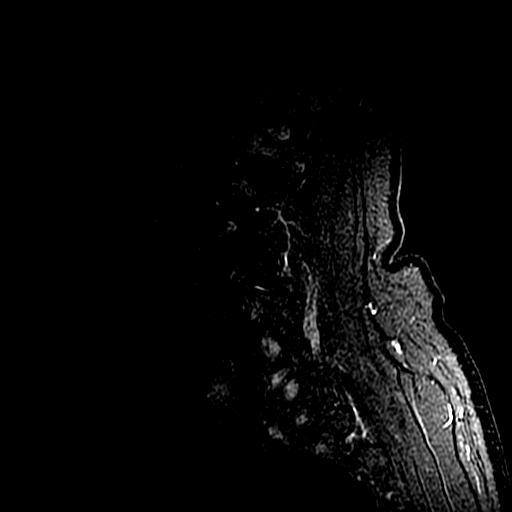

[Series 15: T2 · axial · 3.0mm · 0.39mm/px · z∈[-242,-162]mm · 5 of 30 slices shown (2 of 2)]
[im 1/30]
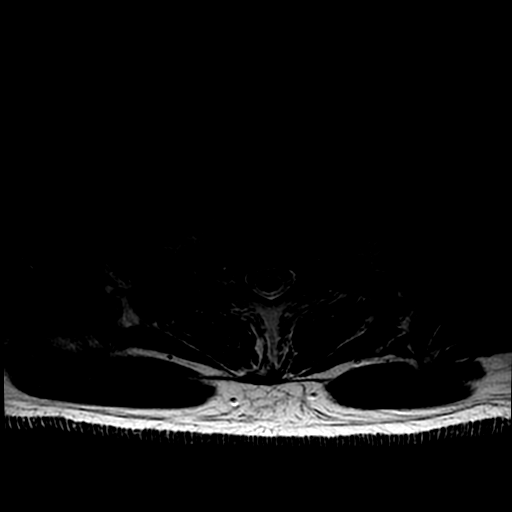
[im 5/30]
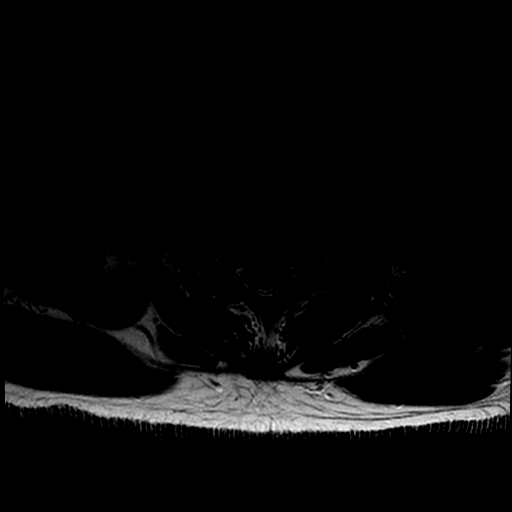
[im 9/30]
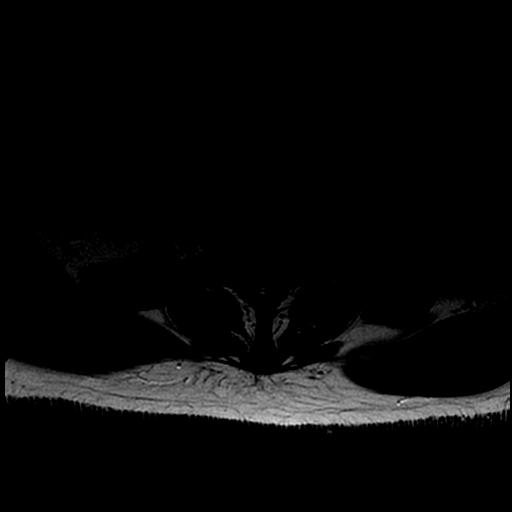
[im 15/30]
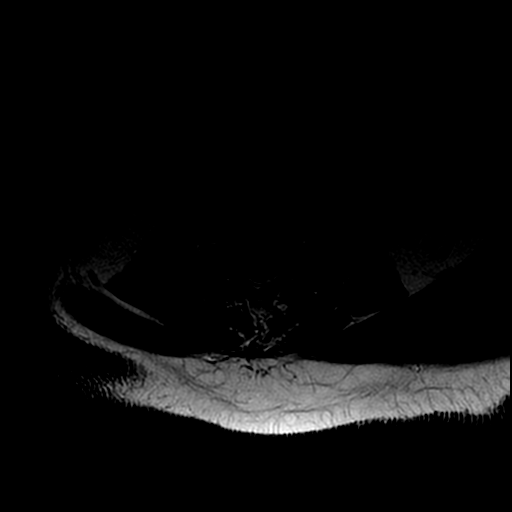
[im 25/30]
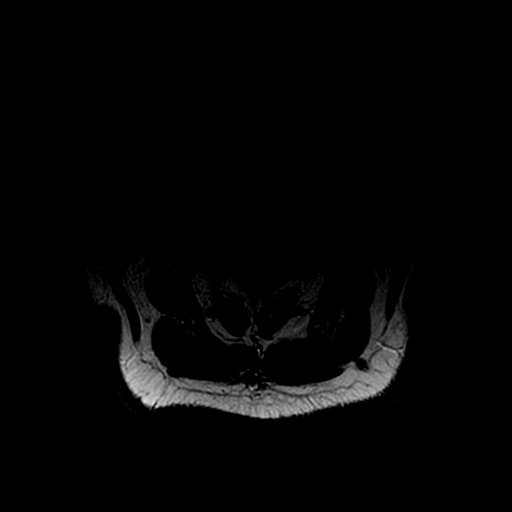

[17 of 48 positions shown; findings below may reference images not displayed]

FINDINGS: Alignment: Chronic straightening of the normal cervical lordosis. No
significant listhesis.

Vertebrae: No fracture, suspicious marrow lesion, or significant
marrow edema. Interval C5-C7 ACDF with evidence of solid
arthrodesis.

Cord: Normal signal.

Posterior Fossa, vertebral arteries, paraspinal tissues: 1.6 cm
homogeneously T2 hyperintense lesion posterior to the left
submandibular gland. Preserved vertebral artery flow voids.

Disc levels:

C2-3: Small central disc protrusion, less prominent than on the
prior study and without associated spinal stenosis. Mild
uncovertebral spurring and facet arthrosis without significant
neural foraminal stenosis.

C3-4: Mild disc space narrowing. Disc bulging, uncovertebral
spurring, and moderate right and mild left facet arthrosis result in
moderate spinal stenosis and moderate to severe right and
mild-to-moderate left neural foraminal stenosis, progressed from
prior.

C4-5: Mild disc space narrowing. Disc bulging, a central/left
central disc protrusion, uncovertebral spurring, and mild facet
arthrosis result in moderate spinal stenosis with mild cord
flattening and moderate to severe bilateral neural foraminal
stenosis, progressed from prior.

C5-6: Interval ACDF. Patent spinal canal. Mild-to-moderate bilateral
neural foraminal stenosis due to uncovertebral spurring.

C6-7: Interval ACDF. Patent spinal canal. Mild-to-moderate bilateral
neural foraminal stenosis due to uncovertebral spurring.

C7-T1: Minimal disc bulging and uncovertebral spurring without
stenosis.
IMPRESSION: 1. C5-C7 ACDF without residual spinal stenosis.
2. Progressive disc degeneration at C3-4 and C4-5 with moderate
spinal stenosis and moderate to severe neural foraminal stenosis.

ADDENDUM:
1.6 cm lesion posterior to the left submandibular gland,
indeterminate for a cyst, small venolymphatic malformation, or other
mass. Correlate with physical examination and consider
contrast-enhanced neck CT for further evaluation.

*** End of Addendum ***
FINDINGS: Alignment: Chronic straightening of the normal cervical lordosis. No
significant listhesis.

Vertebrae: No fracture, suspicious marrow lesion, or significant
marrow edema. Interval C5-C7 ACDF with evidence of solid
arthrodesis.

Cord: Normal signal.

Posterior Fossa, vertebral arteries, paraspinal tissues: 1.6 cm
homogeneously T2 hyperintense lesion posterior to the left
submandibular gland. Preserved vertebral artery flow voids.

Disc levels:

C2-3: Small central disc protrusion, less prominent than on the
prior study and without associated spinal stenosis. Mild
uncovertebral spurring and facet arthrosis without significant
neural foraminal stenosis.

C3-4: Mild disc space narrowing. Disc bulging, uncovertebral
spurring, and moderate right and mild left facet arthrosis result in
moderate spinal stenosis and moderate to severe right and
mild-to-moderate left neural foraminal stenosis, progressed from
prior.

C4-5: Mild disc space narrowing. Disc bulging, a central/left
central disc protrusion, uncovertebral spurring, and mild facet
arthrosis result in moderate spinal stenosis with mild cord
flattening and moderate to severe bilateral neural foraminal
stenosis, progressed from prior.

C5-6: Interval ACDF. Patent spinal canal. Mild-to-moderate bilateral
neural foraminal stenosis due to uncovertebral spurring.

C6-7: Interval ACDF. Patent spinal canal. Mild-to-moderate bilateral
neural foraminal stenosis due to uncovertebral spurring.

C7-T1: Minimal disc bulging and uncovertebral spurring without
stenosis.
IMPRESSION: 1. C5-C7 ACDF without residual spinal stenosis.
2. Progressive disc degeneration at C3-4 and C4-5 with moderate
spinal stenosis and moderate to severe neural foraminal stenosis.

## 2023-06-06 IMAGING — DX DG CHEST 1V PORT
1 series · 2 of 2 positions shown · non-contrast
Comparison: May 02, 2011

CLINICAL DATA: A 70-year-old male presents for evaluation of
bradycardia.

EXAM:
PORTABLE CHEST 1 VIEW

[Series 1: chest · 0.14mm/px · 2 of 2 slices shown]
[im 1/2]
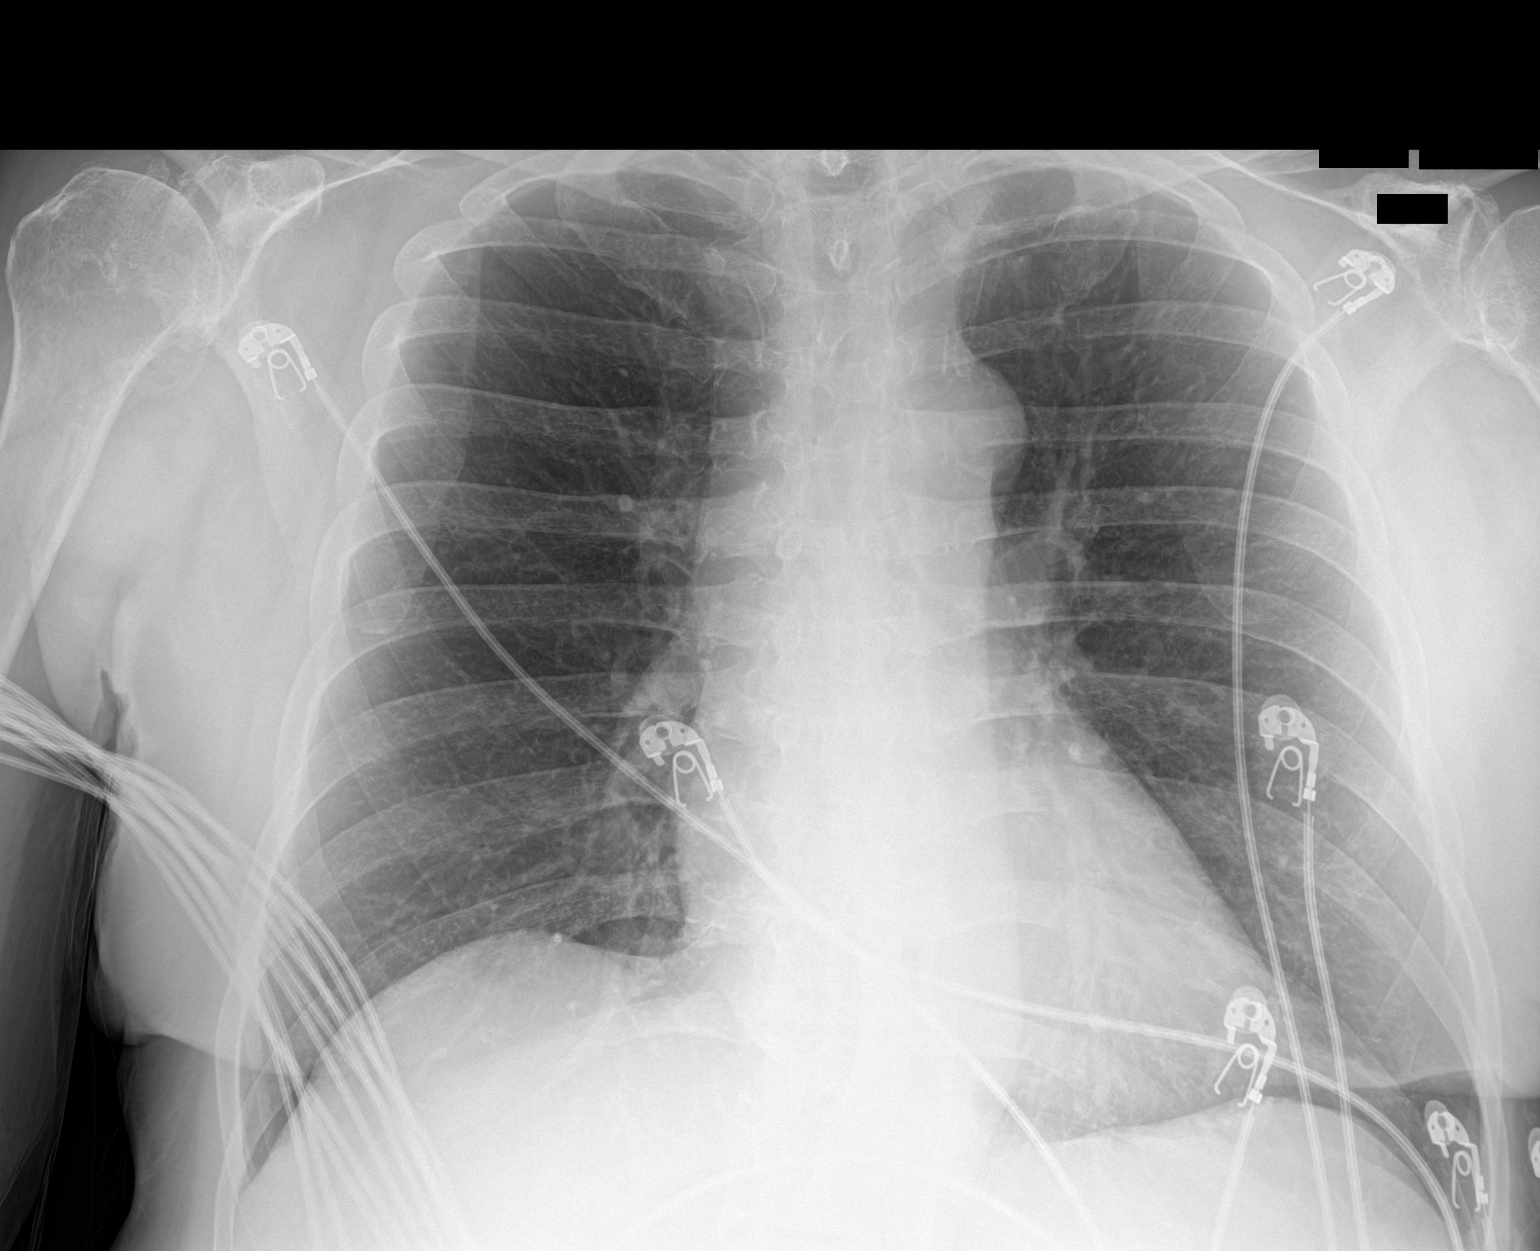
[im 2/2]
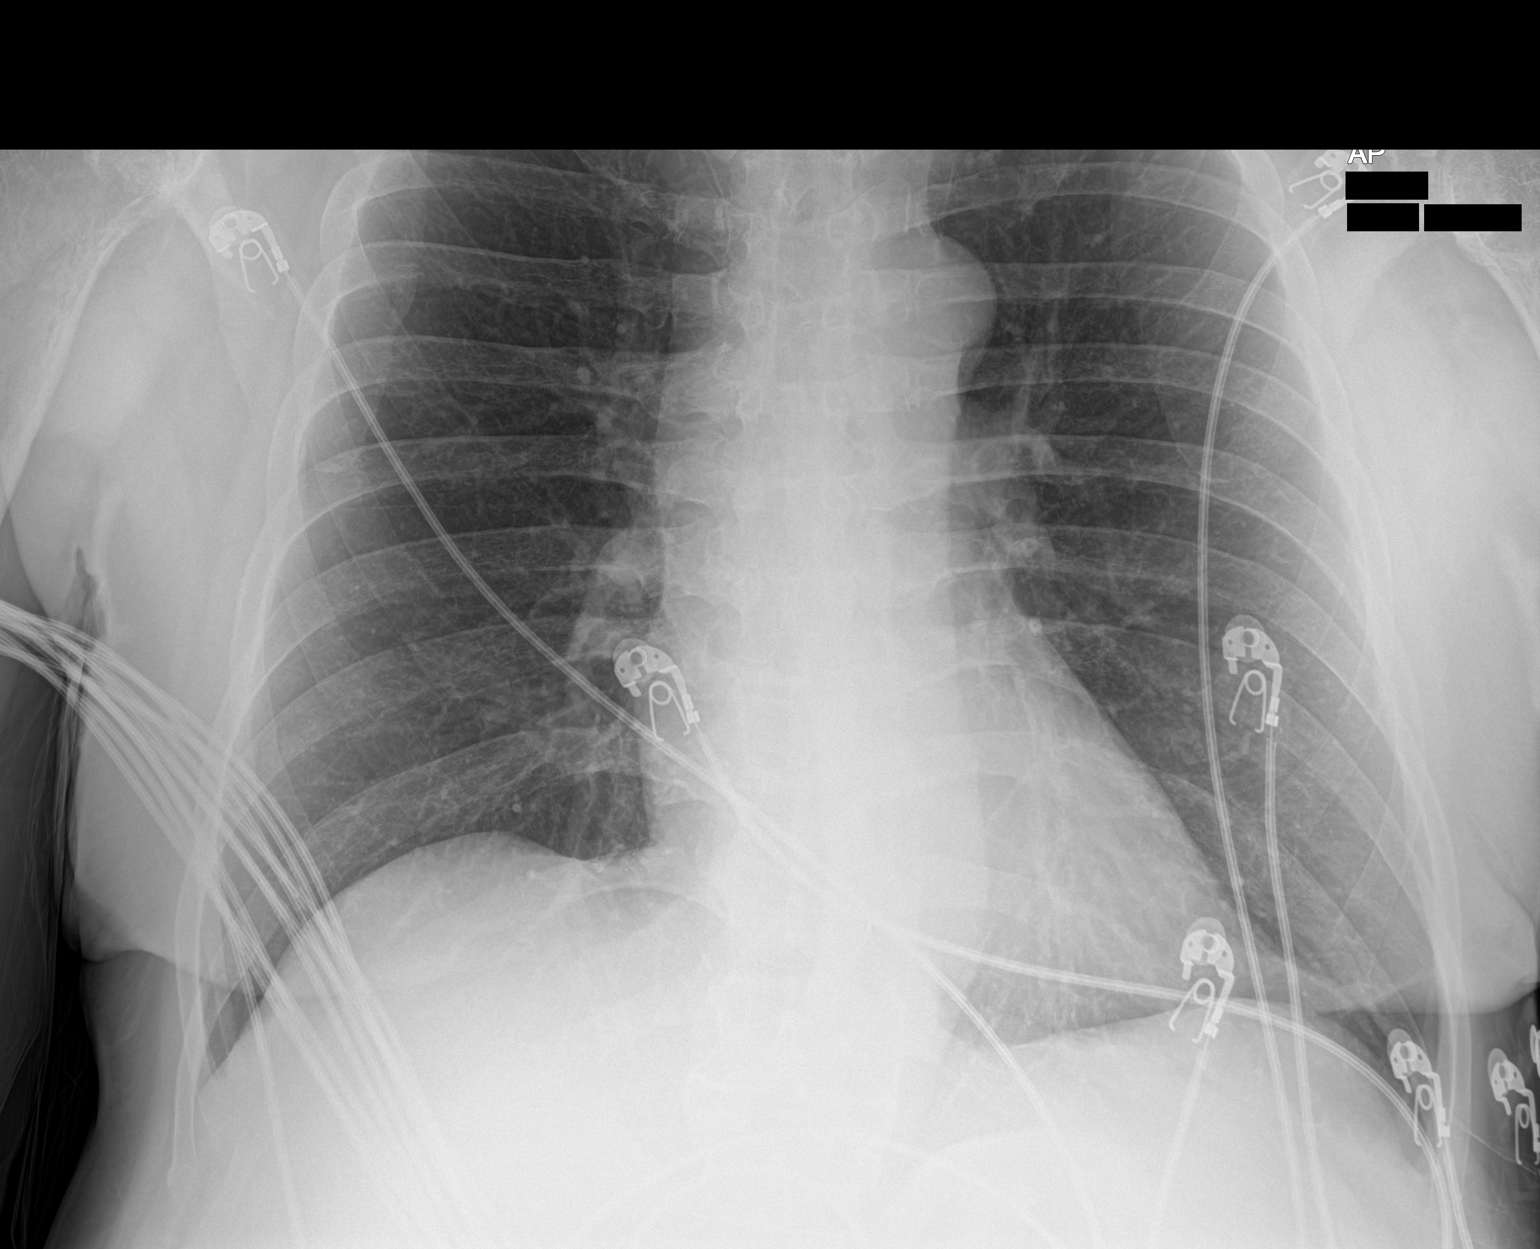

[2 of 2 positions shown; findings below may reference images not displayed]

FINDINGS: EKG leads project over the chest.

Cardiomediastinal contours and hilar structures are normal.

Lungs are clear.

No acute skeletal process on limited assessment.
IMPRESSION: No acute cardiopulmonary disease.

## 2023-06-10 ENCOUNTER — Other Ambulatory Visit: Payer: Federal, State, Local not specified - PPO

## 2023-06-17 ENCOUNTER — Ambulatory Visit: Payer: Federal, State, Local not specified - PPO | Admitting: Family Medicine

## 2023-06-24 ENCOUNTER — Ambulatory Visit: Payer: Federal, State, Local not specified - PPO

## 2023-06-24 ENCOUNTER — Other Ambulatory Visit (INDEPENDENT_AMBULATORY_CARE_PROVIDER_SITE_OTHER): Payer: Federal, State, Local not specified - PPO

## 2023-06-24 DIAGNOSIS — I441 Atrioventricular block, second degree: Secondary | ICD-10-CM

## 2023-06-24 DIAGNOSIS — E119 Type 2 diabetes mellitus without complications: Secondary | ICD-10-CM

## 2023-06-24 LAB — CUP PACEART REMOTE DEVICE CHECK
Battery Remaining Longevity: 106 mo
Battery Remaining Percentage: 89 %
Battery Voltage: 3.01 V
Brady Statistic AP VP Percent: 2.4 %
Brady Statistic AP VS Percent: 1 %
Brady Statistic AS VP Percent: 97 %
Brady Statistic AS VS Percent: 1 %
Brady Statistic RA Percent Paced: 2.3 %
Brady Statistic RV Percent Paced: 99 %
Date Time Interrogation Session: 20250303020014
Implantable Lead Connection Status: 753985
Implantable Lead Connection Status: 753985
Implantable Lead Implant Date: 20231201
Implantable Lead Implant Date: 20231201
Implantable Lead Location: 753859
Implantable Lead Location: 753860
Implantable Pulse Generator Implant Date: 20231201
Lead Channel Impedance Value: 510 Ohm
Lead Channel Impedance Value: 510 Ohm
Lead Channel Pacing Threshold Amplitude: 0.5 V
Lead Channel Pacing Threshold Amplitude: 1 V
Lead Channel Pacing Threshold Pulse Width: 0.5 ms
Lead Channel Pacing Threshold Pulse Width: 0.5 ms
Lead Channel Sensing Intrinsic Amplitude: 1.1 mV
Lead Channel Sensing Intrinsic Amplitude: 9.4 mV
Lead Channel Setting Pacing Amplitude: 1.25 V
Lead Channel Setting Pacing Amplitude: 2 V
Lead Channel Setting Pacing Pulse Width: 0.5 ms
Lead Channel Setting Sensing Sensitivity: 2.5 mV
Pulse Gen Model: 2272
Pulse Gen Serial Number: 8111871

## 2023-06-24 LAB — HEMOGLOBIN A1C: Hgb A1c MFr Bld: 10 % — ABNORMAL HIGH (ref 4.6–6.5)

## 2023-06-25 ENCOUNTER — Encounter: Payer: Self-pay | Admitting: Cardiology

## 2023-06-26 ENCOUNTER — Other Ambulatory Visit: Payer: Self-pay | Admitting: Family Medicine

## 2023-06-27 LAB — FRUCTOSAMINE: Fructosamine: 312 umol/L — ABNORMAL HIGH (ref 205–285)

## 2023-06-30 ENCOUNTER — Telehealth: Payer: Self-pay | Admitting: Family Medicine

## 2023-06-30 NOTE — Telephone Encounter (Signed)
 Please update patient- they will get a letter/mychart message about this.    The hospital system discovered a software error in a system at the laboratory at [facility] at [address] in [place] that examines kidney function. The patient's results were incorrectly calculated on this test.  The test is called uACR. It measures the amount of albumin (a protein) in the urine relative to creatinine (a waste product). This test is one of several factors used to judge kidney function. The laboratory software miscalculated the ratio of one to the other. The measurements themselves were correct. The software error has been fixed.  I went back and checked the patient's lab results and medicines.    At this point, it does not appear that the patient needs to make any medicine changes.    We can talk about it at his upcoming visit.  Thanks.

## 2023-07-01 ENCOUNTER — Ambulatory Visit (INDEPENDENT_AMBULATORY_CARE_PROVIDER_SITE_OTHER): Payer: Federal, State, Local not specified - PPO | Admitting: Family Medicine

## 2023-07-01 ENCOUNTER — Encounter: Payer: Self-pay | Admitting: Family Medicine

## 2023-07-01 VITALS — BP 138/74 | HR 94 | Temp 98.1°F | Ht 63.0 in | Wt 166.8 lb

## 2023-07-01 DIAGNOSIS — E119 Type 2 diabetes mellitus without complications: Secondary | ICD-10-CM

## 2023-07-01 DIAGNOSIS — Z7984 Long term (current) use of oral hypoglycemic drugs: Secondary | ICD-10-CM | POA: Diagnosis not present

## 2023-07-01 MED ORDER — METFORMIN HCL 500 MG PO TABS: ORAL_TABLET | ORAL | Status: DC

## 2023-07-01 NOTE — Patient Instructions (Addendum)
 If lightheaded, then cut amlodipine in half.  Recheck in about 6 months.  Labs before a yearly visit.  Take care.  Glad to see you.

## 2023-07-01 NOTE — Telephone Encounter (Signed)
 See OV  note

## 2023-07-01 NOTE — Progress Notes (Unsigned)
 Diabetes:  Using medications without difficulties: yes Hypoglycemic episodes:no Hyperglycemic episodes:no Feet problems:no Blood Sugars averaging: 100-125 on home checks in the AMs eye exam within last year: yes Intentional weight loss with diet and exercise.   He isn't lightheaded, cautions d/w pt.    D/w pt about prev MALB results.  See prev phone note. I went back and checked the patient's lab results and medicines. At this point, it does not appear that the patient needs to make any medicine changes.     Meds, vitals, and allergies reviewed.   ROS: Per HPI unless specifically indicated in ROS section   GEN: nad, alert and oriented HEENT: mucous membranes moist NECK: supple w/o LA CV: rrr. PULM: ctab, no inc wob ABD: soft, +bs EXT: no edema SKIN: no acute rash  Diabetic foot exam: Normal inspection No skin breakdown No calluses  Normal DP pulses Normal sensation to light touch and monofilament Nails normal  30 minutes were devoted to patient care in this encounter (this includes time spent reviewing the patient's file/history, interviewing and examining the patient, counseling/reviewing plan with patient).

## 2023-07-03 NOTE — Assessment & Plan Note (Signed)
 He has been working on diet and exercise.  History of intentional weight loss.  Cautions regarding relative hypotension discussed with patient.  If lightheaded, then cut amlodipine in half.  Recheck in about 6 months.  Labs before a yearly visit.  Continue benazepril and metformin.  Continue statin.  I thanked him for his effort. Fructosamine converts to A1c ~6.9.

## 2023-07-11 ENCOUNTER — Other Ambulatory Visit: Payer: Self-pay | Admitting: Family Medicine

## 2023-07-19 ENCOUNTER — Other Ambulatory Visit: Payer: Self-pay | Admitting: Family Medicine

## 2023-07-19 NOTE — Telephone Encounter (Signed)
 Copied from CRM [id number]. Topic: Clinical - Medication Refill >> Jul 19, 2023  5:06 PM Sonny Dandy B wrote: Most Recent Primary Care Visit:  Provider: Joaquim Nam  Department: [facility]  Visit Type: OFFICE VISIT  Date: 07/01/2023  Medication: Lancets (ONETOUCH ULTRASOFT) lancets [  Has the patient contacted their pharmacy? Yes (Agent: If no, request that the patient contact the pharmacy for the refill. If patient does not wish to contact the pharmacy document the reason why and proceed with request.) (Agent: If yes, when and what did the pharmacy advise?)  Is this the correct pharmacy for this prescription? Yes If no, delete pharmacy and type the correct one.  This is the patient's preferred pharmacy:  [id number] [place], [facility] - [address] [place] [address] Phone: [phone] Fax: [phone]  [id number] [place] [place] - [address] [place] [address] Phone: [phone] Fax: [phone]   Has the prescription been filled recently? Yes  Is the patient out of the medication? Yes  Has the patient been seen for an appointment in the last year OR does the patient have an upcoming appointment? Yes  Can we respond through MyChart? Yes  Agent: Please be advised that Rx refills may take up to 3 business days. We ask that you follow-up with your pharmacy.

## 2023-07-23 MED ORDER — ONETOUCH ULTRASOFT LANCETS MISC: 12 refills | Status: DC

## 2023-07-23 NOTE — Telephone Encounter (Signed)
 I sent the prescription for lancets.  I also updated his med list about meloxicam.  Please verify this with the patient.  The instructions on that should be to take meloxicam daily as needed, not 3 times a day.  Thanks.

## 2023-07-26 NOTE — Progress Notes (Signed)
 Remote pacemaker transmission.

## 2023-09-23 ENCOUNTER — Ambulatory Visit (INDEPENDENT_AMBULATORY_CARE_PROVIDER_SITE_OTHER): Payer: Federal, State, Local not specified - PPO

## 2023-09-23 ENCOUNTER — Ambulatory Visit: Payer: Self-pay | Admitting: Cardiology

## 2023-09-23 DIAGNOSIS — I441 Atrioventricular block, second degree: Secondary | ICD-10-CM | POA: Diagnosis not present

## 2023-09-23 LAB — CUP PACEART REMOTE DEVICE CHECK
Battery Remaining Longevity: 103 mo
Battery Remaining Percentage: 86 %
Battery Voltage: 3.01 V
Brady Statistic AP VP Percent: 2 %
Brady Statistic AP VS Percent: 1 %
Brady Statistic AS VP Percent: 97 %
Brady Statistic AS VS Percent: 1 %
Brady Statistic RA Percent Paced: 2 %
Brady Statistic RV Percent Paced: 99 %
Date Time Interrogation Session: 20250602020013
Implantable Lead Connection Status: 753985
Implantable Lead Connection Status: 753985
Implantable Lead Implant Date: 20231201
Implantable Lead Implant Date: 20231201
Implantable Lead Location: 753859
Implantable Lead Location: 753860
Implantable Pulse Generator Implant Date: 20231201
Lead Channel Impedance Value: 480 Ohm
Lead Channel Impedance Value: 510 Ohm
Lead Channel Pacing Threshold Amplitude: 0.5 V
Lead Channel Pacing Threshold Amplitude: 0.875 V
Lead Channel Pacing Threshold Pulse Width: 0.5 ms
Lead Channel Pacing Threshold Pulse Width: 0.5 ms
Lead Channel Sensing Intrinsic Amplitude: 1.4 mV
Lead Channel Sensing Intrinsic Amplitude: 11.7 mV
Lead Channel Setting Pacing Amplitude: 1.125
Lead Channel Setting Pacing Amplitude: 2 V
Lead Channel Setting Pacing Pulse Width: 0.5 ms
Lead Channel Setting Sensing Sensitivity: 2.5 mV
Pulse Gen Model: 2272
Pulse Gen Serial Number: 8111871

## 2023-10-05 ENCOUNTER — Other Ambulatory Visit: Payer: Self-pay | Admitting: Family Medicine

## 2023-10-07 ENCOUNTER — Other Ambulatory Visit: Payer: Self-pay | Admitting: Family Medicine

## 2023-10-30 ENCOUNTER — Other Ambulatory Visit: Payer: Self-pay | Admitting: Family Medicine

## 2023-10-31 ENCOUNTER — Other Ambulatory Visit: Payer: Self-pay | Admitting: Family Medicine

## 2023-11-12 NOTE — Progress Notes (Signed)
 Remote pacemaker transmission.

## 2023-12-01 ENCOUNTER — Telehealth: Payer: Self-pay | Admitting: Family Medicine

## 2023-12-01 DIAGNOSIS — R221 Localized swelling, mass and lump, neck: Secondary | ICD-10-CM

## 2023-12-01 NOTE — Telephone Encounter (Signed)
 Due for f/u u/s.  Ordered.  Let me know if he can't get scheduled.  Thanks.

## 2023-12-02 NOTE — Telephone Encounter (Signed)
 Left voicemail for patient to return call to office.

## 2023-12-03 NOTE — Telephone Encounter (Signed)
 Closing encounter patients preference has been added to the referral

## 2023-12-03 NOTE — Telephone Encounter (Signed)
 Left voicemail for patient to return call to office.

## 2023-12-03 NOTE — Telephone Encounter (Signed)
 Patient walked in to follow-up on voicemail. Relayed messsage below  Patient states he is available for ultrasound on Monday, Thursday or Friday

## 2023-12-13 ENCOUNTER — Ambulatory Visit
Admission: RE | Admit: 2023-12-13 | Discharge: 2023-12-13 | Disposition: A | Discharge status: Home / Self Care | Source: Ambulatory Visit | Attending: Family Medicine | Admitting: Family Medicine

## 2023-12-13 DIAGNOSIS — R221 Localized swelling, mass and lump, neck: Secondary | ICD-10-CM | POA: Diagnosis present

## 2023-12-23 ENCOUNTER — Ambulatory Visit: Payer: Self-pay | Admitting: Family Medicine

## 2023-12-25 ENCOUNTER — Ambulatory Visit (INDEPENDENT_AMBULATORY_CARE_PROVIDER_SITE_OTHER): Payer: Federal, State, Local not specified - PPO

## 2023-12-25 DIAGNOSIS — I495 Sick sinus syndrome: Secondary | ICD-10-CM | POA: Diagnosis not present

## 2023-12-26 LAB — CUP PACEART REMOTE DEVICE CHECK
Battery Remaining Longevity: 98 mo
Battery Remaining Percentage: 84 %
Battery Voltage: 2.99 V
Brady Statistic AP VP Percent: 1.9 %
Brady Statistic AP VS Percent: 1 %
Brady Statistic AS VP Percent: 98 %
Brady Statistic AS VS Percent: 1 %
Brady Statistic RA Percent Paced: 1.8 %
Brady Statistic RV Percent Paced: 99 %
Date Time Interrogation Session: 20250901032639
Implantable Lead Connection Status: 753985
Implantable Lead Connection Status: 753985
Implantable Lead Implant Date: 20231201
Implantable Lead Implant Date: 20231201
Implantable Lead Location: 753859
Implantable Lead Location: 753860
Implantable Pulse Generator Implant Date: 20231201
Lead Channel Impedance Value: 510 Ohm
Lead Channel Impedance Value: 510 Ohm
Lead Channel Pacing Threshold Amplitude: 0.5 V
Lead Channel Pacing Threshold Amplitude: 1 V
Lead Channel Pacing Threshold Pulse Width: 0.5 ms
Lead Channel Pacing Threshold Pulse Width: 0.5 ms
Lead Channel Sensing Intrinsic Amplitude: 1.5 mV
Lead Channel Sensing Intrinsic Amplitude: 12 mV
Lead Channel Setting Pacing Amplitude: 1.25 V
Lead Channel Setting Pacing Amplitude: 2 V
Lead Channel Setting Pacing Pulse Width: 0.5 ms
Lead Channel Setting Sensing Sensitivity: 2.5 mV
Pulse Gen Model: 2272
Pulse Gen Serial Number: 8111871

## 2023-12-27 ENCOUNTER — Other Ambulatory Visit

## 2023-12-28 ENCOUNTER — Ambulatory Visit: Payer: Self-pay | Admitting: Cardiology

## 2023-12-29 ENCOUNTER — Other Ambulatory Visit: Payer: Self-pay | Admitting: Family Medicine

## 2023-12-29 DIAGNOSIS — E119 Type 2 diabetes mellitus without complications: Secondary | ICD-10-CM

## 2024-01-01 ENCOUNTER — Other Ambulatory Visit: Payer: Self-pay

## 2024-01-01 ENCOUNTER — Telehealth: Payer: Self-pay

## 2024-01-01 MED ORDER — COVID-19 MRNA VAC-TRIS(PFIZER) 30 MCG/0.3ML IM SUSY: 0.3000 mL | PREFILLED_SYRINGE | Freq: Once | INTRAMUSCULAR | 0 refills | Status: AC

## 2024-01-01 NOTE — Telephone Encounter (Signed)
 Copied from CRM [id number]. Topic: Clinical - Medication Question >> Jan 01, 2024  2:59 PM Rea C wrote: Reason for CRM: Patient would like to request a covid vaccine prescription.   [id number] [place] [address] [place] [address] Phone: [phone] Fax: [phone]

## 2024-01-01 NOTE — Telephone Encounter (Signed)
 Patient notified that prescription has been sent

## 2024-01-03 ENCOUNTER — Encounter: Admitting: Family Medicine

## 2024-01-03 NOTE — Progress Notes (Signed)
 Remote PPM Transmission

## 2024-01-04 ENCOUNTER — Other Ambulatory Visit: Payer: Self-pay | Admitting: Family Medicine

## 2024-01-06 ENCOUNTER — Other Ambulatory Visit (INDEPENDENT_AMBULATORY_CARE_PROVIDER_SITE_OTHER)

## 2024-01-06 DIAGNOSIS — E119 Type 2 diabetes mellitus without complications: Secondary | ICD-10-CM | POA: Diagnosis not present

## 2024-01-06 LAB — COMPREHENSIVE METABOLIC PANEL WITH GFR
ALT: 28 U/L (ref 0–53)
AST: 29 U/L (ref 0–37)
Albumin: 4.4 g/dL (ref 3.5–5.2)
Alkaline Phosphatase: 36 U/L — ABNORMAL LOW (ref 39–117)
BUN: 17 mg/dL (ref 6–23)
CO2: 27 meq/L (ref 19–32)
Calcium: 10.1 mg/dL (ref 8.4–10.5)
Chloride: 102 meq/L (ref 96–112)
Creatinine, Ser: 0.88 mg/dL (ref 0.40–1.50)
GFR: 84.58 mL/min (ref >60.00)
Glucose, Bld: 159 mg/dL — ABNORMAL HIGH (ref 70–99)
Potassium: 4 meq/L (ref 3.5–5.1)
Sodium: 138 meq/L (ref 135–145)
Total Bilirubin: 0.7 mg/dL (ref 0.2–1.2)
Total Protein: 7.3 g/dL (ref 6.0–8.3)

## 2024-01-06 LAB — LIPID PANEL
Cholesterol: 141 mg/dL (ref 0–200)
HDL: 39 mg/dL — ABNORMAL LOW (ref >39.00)
LDL Cholesterol: 67 mg/dL (ref 0–99)
NonHDL: 102.01
Total CHOL/HDL Ratio: 4
Triglycerides: 175 mg/dL — ABNORMAL HIGH (ref 0.0–149.0)
VLDL: 35 mg/dL (ref 0.0–40.0)

## 2024-01-06 LAB — HEMOGLOBIN A1C: Hgb A1c MFr Bld: 9 % — ABNORMAL HIGH (ref 4.6–6.5)

## 2024-01-07 ENCOUNTER — Encounter: Payer: Self-pay | Admitting: Internal Medicine

## 2024-01-07 LAB — MICROALBUMIN / CREATININE URINE RATIO
Creatinine,U: 100.7 mg/dL
Microalb Creat Ratio: 25.9 mg/g (ref 0.0–30.0)
Microalb, Ur: 2.6 mg/dL — ABNORMAL HIGH (ref 0.0–1.9)

## 2024-01-08 ENCOUNTER — Ambulatory Visit: Payer: Self-pay | Admitting: Family Medicine

## 2024-01-09 LAB — FRUCTOSAMINE: Fructosamine: 320 umol/L — ABNORMAL HIGH (ref 205–285)

## 2024-01-13 ENCOUNTER — Other Ambulatory Visit: Payer: Self-pay

## 2024-01-13 ENCOUNTER — Encounter: Payer: Self-pay | Admitting: Family Medicine

## 2024-01-13 ENCOUNTER — Ambulatory Visit: Admitting: Family Medicine

## 2024-01-13 VITALS — BP 144/72 | HR 97 | Temp 98.5°F | Ht 66.38 in | Wt 172.6 lb

## 2024-01-13 DIAGNOSIS — I10 Essential (primary) hypertension: Secondary | ICD-10-CM

## 2024-01-13 DIAGNOSIS — Z0000 Encounter for general adult medical examination without abnormal findings: Secondary | ICD-10-CM

## 2024-01-13 DIAGNOSIS — E119 Type 2 diabetes mellitus without complications: Secondary | ICD-10-CM

## 2024-01-13 DIAGNOSIS — R221 Localized swelling, mass and lump, neck: Secondary | ICD-10-CM

## 2024-01-13 DIAGNOSIS — E782 Mixed hyperlipidemia: Secondary | ICD-10-CM

## 2024-01-13 DIAGNOSIS — Z7189 Other specified counseling: Secondary | ICD-10-CM

## 2024-01-13 MED ORDER — ONETOUCH ULTRASOFT LANCETS MISC: 12 refills | Status: AC

## 2024-01-13 MED ORDER — ONETOUCH ULTRASOFT LANCETS MISC: 12 refills | Status: DC

## 2024-01-13 NOTE — Patient Instructions (Addendum)
 Recheck in about 6 months.  Labs ahead of time.  Take care.  Glad to see you.  Thanks for your effort.  Please call GI about follow up.

## 2024-01-13 NOTE — Progress Notes (Unsigned)
 [facility], [facility]/w pt.   shingrix  2020 Flu [facility] 20[facility].   PNA up to date.  RSV d/w pt.   Colonoscopy 2022- letter given to patient 2025.  Prostate cancer screening and PSA options (with potential risks and benefits of testing vs not testing) were discussed along with recent recs/guidelines.  He declined testing PSA at this point. Diet and exercise d/w pt.  Exercise- walking at work and at home.  Diet is good.    Living will d/w pt.  Wife designated if patient were incapacitated.   Diabetes:  Using medications without difficulties:yes Hypoglycemic episodes:no Hyperglycemic episodes:no Feet problems:no Blood Sugars averaging: ~120s.   eye exam within last year: f/u pending 2025.   D/w pt about A1c vs fructosamine discrepancy.   Sugar controlled on home checks.   Hypertension:    Using medication without problems or lightheadedness: yes Chest pain with exertion:no Edema:no Short of breath:no Labs d/w pt.    Elevated Cholesterol: Using medications without problems: yes Muscle aches: no Diet compliance: yes Exercise: yes Walking for exercise.    D/w pt about recent imaging.  The cyst is similar/possibly slightly smaller than before. It is very reassuring that it is not enlarging. He isn't having symptoms.  D/w pt that I think we can recheck this in 1 year.   He has pacer mgmt per cardiology.    PMH and SH reviewed.   Vital signs, Meds and allergies reviewed.  ROS: Per HPI unless specifically indicated in ROS section   GEN: nad, alert and oriented HEENT: mucous membranes moist NECK: supple w/o LA CV: rrr.  no murmur PULM: ctab, no inc wob ABD: soft, +bs EXT: no edema SKIN: no acute rash  Diabetic foot exam: Normal inspection No skin breakdown No calluses  Normal DP pulses Normal sensation to light tough and monofilament Nails normal

## 2024-01-15 DIAGNOSIS — Z0000 Encounter for general adult medical examination without abnormal findings: Secondary | ICD-10-CM | POA: Insufficient documentation

## 2024-01-15 NOTE — Assessment & Plan Note (Signed)
 Living will d/w pt.  Wife designated if patient were incapacitated.   ?

## 2024-01-15 NOTE — Assessment & Plan Note (Signed)
 Labs discussed.  Continue work on diet and exercise.  Continue amlodipine  and benazepril .

## 2024-01-15 NOTE — Assessment & Plan Note (Signed)
 D/w pt about A1c vs fructosamine discrepancy.   Sugar controlled on home checks.  Fructosamine converts to an A1c of approximately 7.  Recheck in about 6 months.  Labs ahead of time.  No change in meds at this point.  Continue metformin .

## 2024-01-15 NOTE — Assessment & Plan Note (Signed)
 [facility], [facility]/w pt.   shingrix  2020 Flu prev done 2025 at [facility].   PNA up to date.  RSV d/w pt.   Colonoscopy 2022- letter given to patient 2025.  Prostate cancer screening and PSA options (with potential risks and benefits of testing vs not testing) were discussed along with recent recs/guidelines.  He declined testing PSA at this point. Diet and exercise d/w pt.  Exercise- walking at work and at home.  Diet is good.    Living will d/w pt.  Wife designated if patient were incapacitated.

## 2024-01-15 NOTE — Assessment & Plan Note (Signed)
 Continue work on diet and exercise.  Continue Crestor  and fenofibrate .

## 2024-01-15 NOTE — Assessment & Plan Note (Signed)
 D/w pt about recent imaging.  The cyst is similar/possibly slightly smaller than before. It is very reassuring that it is not enlarging. He isn't having symptoms.  D/w pt that I think we can recheck this in 1 year.

## 2024-01-27 ENCOUNTER — Other Ambulatory Visit: Payer: Self-pay

## 2024-01-29 MED ORDER — ROSUVASTATIN CALCIUM 40 MG PO TABS: 40.0000 mg | ORAL_TABLET | Freq: Every day | ORAL | 0 refills | Status: DC

## 2024-01-29 MED ORDER — AMLODIPINE BESYLATE 10 MG PO TABS: 10.0000 mg | ORAL_TABLET | Freq: Every day | ORAL | 0 refills | Status: AC

## 2024-01-29 MED ORDER — AMLODIPINE BESYLATE 10 MG PO TABS: 10.0000 mg | ORAL_TABLET | Freq: Every day | ORAL | 3 refills | Status: DC

## 2024-01-29 MED ORDER — AMLODIPINE BESYLATE 10 MG PO TABS: 10.0000 mg | ORAL_TABLET | Freq: Every day | ORAL | 0 refills | Status: DC

## 2024-01-29 NOTE — Addendum Note (Signed)
 Addended by: BLUFORD, Renella Steig L on: 01/29/2024 10:39 AM   Modules accepted: Orders

## 2024-01-29 NOTE — Addendum Note (Signed)
 Addended by: BLUFORD, Shaquan Missey L on: 01/29/2024 10:37 AM   Modules accepted: Orders

## 2024-01-29 NOTE — Addendum Note (Signed)
 Addended by: BLUFORD, Jeronda Don L on: 01/29/2024 10:41 AM   Modules accepted: Orders

## 2024-01-31 ENCOUNTER — Encounter: Payer: Self-pay | Admitting: Internal Medicine

## 2024-02-05 ENCOUNTER — Other Ambulatory Visit: Payer: Self-pay | Admitting: Family Medicine

## 2024-02-05 NOTE — Telephone Encounter (Signed)
 Copied from CRM #[id number]. Topic: Clinical - Medication Refill >> Feb 05, 2024  3:27 PM Eva FALCON wrote: Medication:  Choline Fenofibrate  (FENOFIBRIC ACID ) 135 MG CPDR   Has the patient contacted their pharmacy? Yes, was advised to call PCP office.  (Agent: If no, request that the patient contact the pharmacy for the refill. If patient does not wish to contact the pharmacy document the reason why and proceed with request.) (Agent: If yes, when and what did the pharmacy advise?)  This is the patient's preferred pharmacy:  [id number] [place] [facility] - [address] [place] [address] Phone: [phone] Fax: [phone]   Is this the correct pharmacy for this prescription? Yes If no, delete pharmacy and type the correct one.   Has the prescription been filled recently? Yes  Is the patient out of the medication? No, has 1 more left.   Has the patient been seen for an appointment in the last year OR does the patient have an upcoming appointment? Yes  Can we respond through MyChart? Yes  Agent: Please be advised that Rx refills may take up to 3 business days. We ask that you follow-up with your pharmacy.

## 2024-02-07 MED ORDER — FENOFIBRIC ACID 135 MG PO CPDR: 1.0000 | DELAYED_RELEASE_CAPSULE | Freq: Every day | ORAL | 3 refills | Status: AC

## 2024-02-27 ENCOUNTER — Ambulatory Visit

## 2024-02-27 VITALS — Ht 66.0 in | Wt 178.0 lb

## 2024-02-27 DIAGNOSIS — Z860100 Personal history of colon polyps, unspecified: Secondary | ICD-10-CM

## 2024-02-27 MED ORDER — NA SULFATE-K SULFATE-MG SULF 17.5-3.13-1.6 GM/177ML PO SOLN
1.0000 | Freq: Once | ORAL | 0 refills | Status: AC
Start: 2024-02-27 — End: 2024-02-27

## 2024-02-27 NOTE — Progress Notes (Signed)
 Pre visit completed via phone call; Patient verified name, DOB, and address; No egg or soy allergy known to patient;  No issues known to pt with past sedation with any surgeries or procedures; Patient denies ever being told they had issues or difficulty with intubation;  No FH of Malignant Hyperthermia; Pt is not on diet pills; Pt is not on home 02;  Pt is not on blood thinners;  Pt denies issues with constipation;  No A fib or A flutter; Have any cardiac testing pending--NO Insurance verified during PV appt--- BCBS Fed Pt can ambulate without assistance;  Pt denies use of chewing tobacco; Discussed diabetic/weight loss medication holds; Discussed NSAID holds; Checked BMI to be less than 50; Pt instructed to use Singlecare.com or GoodRx for a price reduction on prep;  Patient's chart reviewed by Norleen Schillings CNRA prior to previsit and patient appropriate for the [facility];  Pre visit completed and red dot placed by patient's name on their procedure day (on provider's schedule);  Instructions sent to MyChart as well as printed and given to the patient at time of PV appt;

## 2024-03-09 ENCOUNTER — Encounter: Payer: Self-pay | Admitting: Internal Medicine

## 2024-03-12 ENCOUNTER — Encounter: Payer: Self-pay | Admitting: Internal Medicine

## 2024-03-12 ENCOUNTER — Ambulatory Visit (AMBULATORY_SURGERY_CENTER): Admitting: Internal Medicine

## 2024-03-12 VITALS — BP 120/71 | HR 90 | Temp 100.0°F | Resp 12 | Ht 66.0 in | Wt 178.0 lb

## 2024-03-12 DIAGNOSIS — Z1211 Encounter for screening for malignant neoplasm of colon: Secondary | ICD-10-CM | POA: Diagnosis present

## 2024-03-12 DIAGNOSIS — Z860101 Personal history of adenomatous and serrated colon polyps: Secondary | ICD-10-CM

## 2024-03-12 DIAGNOSIS — K648 Other hemorrhoids: Secondary | ICD-10-CM

## 2024-03-12 DIAGNOSIS — D120 Benign neoplasm of cecum: Secondary | ICD-10-CM | POA: Diagnosis not present

## 2024-03-12 DIAGNOSIS — Z860100 Personal history of colon polyps, unspecified: Secondary | ICD-10-CM

## 2024-03-12 MED ORDER — SODIUM CHLORIDE 0.9 % IV SOLN: 500.0000 mL | Freq: Once | INTRAVENOUS | Status: DC

## 2024-03-12 NOTE — Op Note (Signed)
 [facility] Patient Name: [name] Procedure Date: 03/12/2024 9:00 AM MRN: [id number] Endoscopist: Lupita FORBES Commander , MD, [id number] Age: 73 Referring MD:  Date of Birth: 06-12-1950 Gender: Male Account #: [id number] Procedure:                Colonoscopy Indications:              High risk colon cancer surveillance: Personal                            history of colonic polyps, Last colonoscopy: 2022 Medicines:                Monitored Anesthesia Care Procedure:                Pre-Anesthesia Assessment:                           - Prior to the procedure, a History and Physical                            was performed, and patient medications and                            allergies were reviewed. The patient's tolerance of                            previous anesthesia was also reviewed. The risks                            and benefits of the procedure and the sedation                            options and risks were discussed with the patient.                            All questions were answered, and informed consent                            was obtained. Prior Anticoagulants: The patient has                            taken no anticoagulant or antiplatelet agents. ASA                            Grade Assessment: III - A patient with severe                            systemic disease. After reviewing the risks and                            benefits, the patient was deemed in satisfactory                            condition to undergo the procedure.  After obtaining informed consent, the colonoscope                            was passed under direct vision. Throughout the                            procedure, the patient's blood pressure, pulse, and                            oxygen saturations were monitored continuously. The                            Olympus Scope SN: [id number] was introduced through                            the anus  and advanced to the the cecum, identified                            by appendiceal orifice and ileocecal valve. The                            colonoscopy was performed without difficulty. The                            patient tolerated the procedure well. The quality                            of the bowel preparation was excellent. The                            ileocecal valve, appendiceal orifice, and rectum                            were photographed. The bowel preparation used was                            SUPREP via split dose instruction. Scope In: 9:17:49 AM Scope Out: 9:28:22 AM Scope Withdrawal Time: 0 hours 8 minutes 21 seconds  Total Procedure Duration: 0 hours 10 minutes 33 seconds  Findings:                 The perianal and digital rectal examinations were                            normal. Pertinent negatives include normal prostate                            (size, shape, and consistency).                           A 5 mm polyp was found in the cecum. The polyp was                            sessile. The polyp was removed with a  cold snare.                            Resection and retrieval were complete. Verification                            of patient identification for the specimen was                            done. Estimated blood loss was minimal.                           Internal hemorrhoids were found during retroflexion.                           The exam was otherwise without abnormality on                            direct and retroflexion views. Complications:            No immediate complications. Estimated Blood Loss:     Estimated blood loss was minimal. Impression:               - One 5 mm polyp in the cecum, removed with a cold                            snare. Resected and retrieved.                           - Internal hemorrhoids.                           - The examination was otherwise normal on direct                            and  retroflexion views.                           - Personal history of colonic polyps. 02/2017 5                            polyps - diminutive - tub adenomas and ssp                           11/24/2020 9 diminutive polyps 6 adenomas 3                            hyperplastic Recommendation:           - Patient has a contact number available for                            emergencies. The signs and symptoms of potential                            delayed complications were discussed with the  patient. Return to normal activities tomorrow.                            Written discharge instructions were provided to the                            patient.                           - Resume previous diet.                           - Continue present medications.                           - No repeat colonoscopy due to age. Lupita FORBES Commander, MD 03/12/2024 9:33:20 AM This report has been signed electronically.

## 2024-03-12 NOTE — Progress Notes (Signed)
 Sedate, gd SR, tolerated procedure well, VSS, report to RN

## 2024-03-12 NOTE — Progress Notes (Signed)
 [facility] History and Physical   Primary Care Physician:  Cleatus Arlyss RAMAN, MD   Reason for Procedure:    Encounter Diagnosis  Name Primary?   Hx of colonic polyps Yes     Plan:    Colonoscopy   The patient was provided an opportunity to ask questions and all were answered. The patient agreed with the plan.   HPI: [name] is a 73 y.o. male here for surveillance colonoscopy exam.  02/2017 5 polyps - diminutive - tub adenomas and ssp 11/24/2020 9 diminutive polyps 6 adenomas 3 hyperplastic recall 2025 Past Medical History:  Diagnosis Date   DDD (degenerative disc disease), cervical    Diabetes mellitus without complication (HCC)    History of chicken pox    History of skin cancer    Dr.Drew Joshua   Hx of adenomatous and sessile serrated colonic polyps 03/21/2017   Hyperlipidemia    [place] Study LDL goal = <130   Hypertension    Metabolic syndrome    Rosacea    Sleep apnea    does not use CPAP machine; treated with prev nasal surgery    Past Surgical History:  Procedure Laterality Date   CERVICAL FUSION  06/12/2010   COLONOSCOPY  04/23/2004   Neg   COLONOSCOPY  2018   Dr.Kymari Nuon   COLONOSCOPY  11/2020   CG-MAC-miralax(exc)-TA x 6   LUMBAR LAMINECTOMY/DECOMPRESSION MICRODISCECTOMY  05/09/2011   Procedure: LUMBAR LAMINECTOMY/DECOMPRESSION MICRODISCECTOMY;  Surgeon: Lynwood SHAUNNA Bern, MD;  Location: [facility];  Service: Orthopedics;  Laterality: Left;  Decompressive laminectomy L2-3,  L3- 4,  L4-5 left, central    NASAL SINUS SURGERY  2005   PACEMAKER IMPLANT N/A 03/23/2022   Procedure: PACEMAKER IMPLANT;  Surgeon: Cindie Ole DASEN, MD;  Location: [facility];  Service: Cardiovascular;  Laterality: N/A;     Current Outpatient Medications  Medication Sig Dispense Refill   amLODipine  (NORVASC ) 10 MG tablet Take 1 tablet (10 mg total) by mouth daily. 90 tablet 0   ASPIRIN  LOW DOSE 81 MG chewable tablet CHEW 1 TABLET BY MOUTH DAILY.  (Patient taking differently: Chew 81 mg by mouth daily.) 108 tablet 4   benazepril  (LOTENSIN ) 20 MG tablet TAKE 1 TABLET BY MOUTH EVERY DAY 90 tablet 3   Choline Fenofibrate  (FENOFIBRIC ACID ) 135 MG CPDR Take 1 capsule (135 mg total) by mouth daily. 90 capsule 3   folic acid  (FOLVITE ) 1 MG tablet TAKE 1 TABLET BY MOUTH EVERY DAY 90 tablet 3   meloxicam (MOBIC) 15 MG tablet Take 15 mg by mouth daily as needed.     metFORMIN  (GLUCOPHAGE ) 500 MG tablet 2 TABLETS IN THE A.M. AND 1 TABLET IN THE P.M. (Patient taking differently: Take 500 mg by mouth. 2 TABLETS IN THE A.M. AND 1 TABLET IN THE P.M.) 270 tablet 3   rosuvastatin  (CRESTOR ) 40 MG tablet Take 1 tablet (40 mg total) by mouth daily. 90 tablet 0   Soft Lens Products (REFRESH CONTACTS DROPS) SOLN Place 1 drop into both eyes daily.     Esomeprazole Magnesium (NEXIUM PO) Take 1 capsule by mouth daily as needed.     Lancets (ONETOUCH ULTRASOFT) lancets Use as instructed to check sugar daily if needed.  E11.9. 100 each 12   Current Facility-Administered Medications  Medication Dose Route Frequency Provider Last Rate Last Admin   0.9 %  sodium chloride  infusion  500 mL Intravenous Once Avram Lupita BRAVO, MD        Allergies as  of 03/12/2024 - Review Complete 03/12/2024  Allergen Reaction Noted   Beta adrenergic blockers Other (See Comments) 09/24/2021    Family History  Problem Relation Age of Onset   Alzheimer's disease Mother    Heart disease Father        Pacemaker   Heart disease Other    Colon cancer Neg Hx    Prostate cancer Neg Hx    Stomach cancer Neg Hx    Colon polyps Neg Hx    Esophageal cancer Neg Hx    Rectal cancer Neg Hx     Social History   Socioeconomic History   Marital status: Married    Spouse name: Not on file   Number of children: 2   Years of education: Not on file   Highest education level: Not on file  Occupational History   Occupation: Aeronautical Engineer  Tobacco Use   Smoking status: Former     Current packs/day: 0.00    Types: Cigarettes    Quit date: 04/24/1971    Years since quitting: 52.9    Passive exposure: Never   Smokeless tobacco: Never  Vaping Use   Vaping status: Never Used  Substance and Sexual Activity   Alcohol  use: No   Drug use: No   Sexual activity: Not on file  Other Topics Concern   Not on file  Social History Narrative   Retired, from post office   Married, 1973   2 sons (1 is local, 1 in Electronics Engineer reserve with prev deployments to [employer])   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     Working part time at [employer].     Social Drivers of Corporate Investment Banker Strain: Not on file  Food Insecurity: No Food Insecurity (03/23/2022)   Hunger Vital Sign    Worried About Running Out of Food in the Last Year: Never true    Ran Out of Food in the Last Year: Never true  Transportation Needs: No Transportation Needs (03/23/2022)   PRAPARE - Administrator, Civil Service (Medical): No    Lack of Transportation (Non-Medical): No  Physical Activity: Not on file  Stress: Not on file  Social Connections: Not on file  Intimate Partner Violence: Not At Risk (03/23/2022)   Humiliation, Afraid, Rape, and Kick questionnaire    Fear of Current or Ex-Partner: No    Emotionally Abused: No    Physically Abused: No    Sexually Abused: No    Review of Systems:  All other review of systems negative except as mentioned in the HPI.  Physical Exam: Vital signs BP (!) 148/83   Pulse (!) 102   Temp (!) 97.5 F (36.4 C) (Temporal)   Ht 5' 6 (1.676 m)   Wt 178 lb (80.7 kg)   SpO2 100%   BMI 28.73 kg/m   General:   Alert,  Well-developed, well-nourished, pleasant and cooperative in NAD Lungs:  Clear throughout to auscultation.   Heart:  Regular rate and rhythm; no murmurs, clicks, rubs,  or gallops. Abdomen:  Soft, nontender and nondistended. Normal bowel sounds.   Neuro/Psych:  Alert and cooperative. Normal mood and affect. A and O x 3   @Alexus Michael  CHARLENA Commander, MD, [facility] [phone] (pager) 03/12/2024 9:09 AM@

## 2024-03-12 NOTE — Patient Instructions (Addendum)
 I removed 1 tiny polyp today.  I will have that analyzed.  I do not think I am going to recommend another routine repeat colonoscopy.  I will let you know.  Your internal hemorrhoids were little swollen today, that commonly happens related to diarrhea with a colonoscopy prep.  I appreciate the opportunity to care for you. Lupita CHARLENA Commander, MD, FACG  YOU HAD AN ENDOSCOPIC PROCEDURE TODAY AT THE [facility]:   Refer to the procedure report that was given to you for any specific questions about what was found during the examination.  If the procedure report does not answer your questions, please call your gastroenterologist to clarify.  If you requested that your care partner not be given the details of your procedure findings, then the procedure report has been included in a sealed envelope for you to review at your convenience later.  YOU SHOULD EXPECT: Some feelings of bloating in the abdomen. Passage of more gas than usual.  Walking can help get rid of the air that was put into your GI tract during the procedure and reduce the bloating. If you had a lower endoscopy (such as a colonoscopy or flexible sigmoidoscopy) you may notice spotting of blood in your stool or on the toilet paper. If you underwent a bowel prep for your procedure, you may not have a normal bowel movement for a few days.  Please Note:  You might notice some irritation and congestion in your nose or some drainage.  This is from the oxygen used during your procedure.  There is no need for concern and it should clear up in a day or so.  SYMPTOMS TO REPORT IMMEDIATELY:  Following lower endoscopy (colonoscopy or flexible sigmoidoscopy):  Excessive amounts of blood in the stool  Significant tenderness or worsening of abdominal pains  Swelling of the abdomen that is new, acute  Fever of 100F or higher  For urgent or emergent issues, a gastroenterologist can be reached at any hour by calling [phone]. Do not use  MyChart messaging for urgent concerns.    DIET:  We do recommend a small meal at first, but then you may proceed to your regular diet.  Drink plenty of fluids but you should avoid alcoholic beverages for 24 hours.  ACTIVITY:  You should plan to take it easy for the rest of today and you should NOT DRIVE or use heavy machinery until tomorrow (because of the sedation medicines used during the test).    FOLLOW UP: Our staff will call the number listed on your records the next business day following your procedure.  We will call around 7:15- 8:00 am to check on you and address any questions or concerns that you may have regarding the information given to you following your procedure. If we do not reach you, we will leave a message.     If any biopsies were taken you will be contacted by phone or by letter within the next 1-3 weeks.  Please call us  at [phone] if you have not heard about the biopsies in 3 weeks.    SIGNATURES/CONFIDENTIALITY: You and/or your care partner have signed paperwork which will be entered into your electronic medical record.  These signatures attest to the fact that that the information above on your After Visit Summary has been reviewed and is understood.  Full responsibility of the confidentiality of this discharge information lies with you and/or your care-partner.

## 2024-03-12 NOTE — Progress Notes (Signed)
 Called to room to assist during endoscopic procedure.  Patient ID and intended procedure confirmed with present staff. Received instructions for my participation in the procedure from the performing physician.

## 2024-03-13 ENCOUNTER — Telehealth: Payer: Self-pay

## 2024-03-13 NOTE — Telephone Encounter (Signed)
  Follow up Call-     03/12/2024    8:14 AM  Call back number  Post procedure Call Back phone  # [phone]  Permission to leave phone message Yes     Patient questions:  Do you have a fever, pain , or abdominal swelling? No. Pain Score  0 *  Have you tolerated food without any problems? Yes.    Have you been able to return to your normal activities? Yes.    Do you have any questions about your discharge instructions: Diet   No. Medications  No. Follow up visit  No.  Do you have questions or concerns about your Care? No.  Actions: * If pain score is 4 or above: No action needed, pain <4.

## 2024-03-16 LAB — OPHTHALMOLOGY REPORT-SCANNED

## 2024-03-16 LAB — SURGICAL PATHOLOGY

## 2024-03-17 ENCOUNTER — Other Ambulatory Visit: Payer: Self-pay | Admitting: Family Medicine

## 2024-03-23 ENCOUNTER — Ambulatory Visit: Payer: Self-pay | Admitting: Internal Medicine

## 2024-03-23 DIAGNOSIS — Z860101 Personal history of adenomatous and serrated colon polyps: Secondary | ICD-10-CM

## 2024-03-25 ENCOUNTER — Ambulatory Visit: Payer: Federal, State, Local not specified - PPO

## 2024-03-25 DIAGNOSIS — I495 Sick sinus syndrome: Secondary | ICD-10-CM

## 2024-03-26 LAB — CUP PACEART REMOTE DEVICE CHECK
Battery Remaining Longevity: 95 mo
Battery Remaining Percentage: 81 %
Battery Voltage: 2.99 V
Brady Statistic AP VP Percent: 1.9 %
Brady Statistic AP VS Percent: 1 %
Brady Statistic AS VP Percent: 98 %
Brady Statistic AS VS Percent: 1 %
Brady Statistic RA Percent Paced: 1.8 %
Brady Statistic RV Percent Paced: 99 %
Date Time Interrogation Session: 20251203231017
Implantable Lead Connection Status: 753985
Implantable Lead Connection Status: 753985
Implantable Lead Implant Date: 20231201
Implantable Lead Implant Date: 20231201
Implantable Lead Location: 753859
Implantable Lead Location: 753860
Implantable Pulse Generator Implant Date: 20231201
Lead Channel Impedance Value: 510 Ohm
Lead Channel Impedance Value: 510 Ohm
Lead Channel Pacing Threshold Amplitude: 0.5 V
Lead Channel Pacing Threshold Amplitude: 0.875 V
Lead Channel Pacing Threshold Pulse Width: 0.5 ms
Lead Channel Pacing Threshold Pulse Width: 0.5 ms
Lead Channel Sensing Intrinsic Amplitude: 1.3 mV
Lead Channel Sensing Intrinsic Amplitude: 12 mV
Lead Channel Setting Pacing Amplitude: 1.125
Lead Channel Setting Pacing Amplitude: 2 V
Lead Channel Setting Pacing Pulse Width: 0.5 ms
Lead Channel Setting Sensing Sensitivity: 2.5 mV
Pulse Gen Model: 2272
Pulse Gen Serial Number: 8111871

## 2024-03-31 NOTE — Progress Notes (Signed)
 Remote PPM Transmission

## 2024-04-09 ENCOUNTER — Ambulatory Visit: Payer: Self-pay | Admitting: Cardiology

## 2024-04-29 ENCOUNTER — Other Ambulatory Visit: Payer: Self-pay | Admitting: Physician Assistant

## 2024-07-09 ENCOUNTER — Other Ambulatory Visit

## 2024-07-10 ENCOUNTER — Other Ambulatory Visit

## 2024-07-16 ENCOUNTER — Ambulatory Visit: Admitting: Family Medicine

## 2024-07-17 ENCOUNTER — Ambulatory Visit: Admitting: Family Medicine
